# Patient Record
Sex: Male | Born: 1970
Health system: Southern US, Community
[De-identification: ages and names within clinical notes are randomized; demographics above are authoritative.]

## PROBLEM LIST (undated history)

## (undated) DIAGNOSIS — T8859XA Other complications of anesthesia, initial encounter: Secondary | ICD-10-CM

## (undated) DIAGNOSIS — R112 Nausea with vomiting, unspecified: Secondary | ICD-10-CM

## (undated) DIAGNOSIS — I10 Essential (primary) hypertension: Secondary | ICD-10-CM

## (undated) DIAGNOSIS — R0981 Nasal congestion: Secondary | ICD-10-CM

## (undated) DIAGNOSIS — Z9889 Other specified postprocedural states: Secondary | ICD-10-CM

## (undated) DIAGNOSIS — G4730 Sleep apnea, unspecified: Secondary | ICD-10-CM

## (undated) DIAGNOSIS — D649 Anemia, unspecified: Secondary | ICD-10-CM

## (undated) DIAGNOSIS — R490 Dysphonia: Secondary | ICD-10-CM

## (undated) DIAGNOSIS — K859 Acute pancreatitis without necrosis or infection, unspecified: Secondary | ICD-10-CM

## (undated) DIAGNOSIS — M1990 Unspecified osteoarthritis, unspecified site: Secondary | ICD-10-CM

## (undated) DIAGNOSIS — K219 Gastro-esophageal reflux disease without esophagitis: Secondary | ICD-10-CM

## (undated) DIAGNOSIS — T4145XA Adverse effect of unspecified anesthetic, initial encounter: Secondary | ICD-10-CM

## (undated) DIAGNOSIS — J342 Deviated nasal septum: Secondary | ICD-10-CM

## (undated) DIAGNOSIS — E119 Type 2 diabetes mellitus without complications: Secondary | ICD-10-CM

## (undated) HISTORY — DX: Anemia, unspecified: D64.9

## (undated) HISTORY — PX: SHOULDER SURGERY: SHX246

## (undated) HISTORY — PX: WISDOM TOOTH EXTRACTION: SHX21

---

## 2005-01-31 ENCOUNTER — Ambulatory Visit (HOSPITAL_BASED_OUTPATIENT_CLINIC_OR_DEPARTMENT_OTHER): Admission: RE | Admit: 2005-01-31 | Discharge: 2005-01-31 | Payer: Self-pay | Admitting: Family Medicine

## 2005-02-04 ENCOUNTER — Ambulatory Visit: Payer: Self-pay | Admitting: Internal Medicine

## 2005-04-20 ENCOUNTER — Ambulatory Visit: Payer: Self-pay | Admitting: Internal Medicine

## 2005-05-16 ENCOUNTER — Ambulatory Visit (HOSPITAL_BASED_OUTPATIENT_CLINIC_OR_DEPARTMENT_OTHER): Admission: RE | Admit: 2005-05-16 | Discharge: 2005-05-16 | Payer: Self-pay | Admitting: Internal Medicine

## 2005-05-20 ENCOUNTER — Ambulatory Visit: Payer: Self-pay | Admitting: Internal Medicine

## 2005-06-04 ENCOUNTER — Ambulatory Visit: Payer: Self-pay | Admitting: Internal Medicine

## 2005-07-06 ENCOUNTER — Ambulatory Visit: Payer: Self-pay | Admitting: Internal Medicine

## 2005-08-10 ENCOUNTER — Ambulatory Visit: Payer: Self-pay | Admitting: Internal Medicine

## 2005-11-23 ENCOUNTER — Ambulatory Visit: Payer: Self-pay | Admitting: Internal Medicine

## 2005-12-28 ENCOUNTER — Ambulatory Visit: Payer: Self-pay | Admitting: Internal Medicine

## 2009-02-23 ENCOUNTER — Emergency Department (HOSPITAL_COMMUNITY): Admission: EM | Admit: 2009-02-23 | Discharge: 2009-02-23 | Payer: Self-pay | Admitting: Emergency Medicine

## 2011-03-16 NOTE — Procedures (Signed)
NAME:  Ronnie Mejia, Ronnie Mejia                ACCOUNT NO.:  [id number]   MEDICAL RECORD NO.:  [id number]          PATIENT TYPE:  OUT   LOCATION:  SLEEP CENTER                 FACILITY:  [facility]   PHYSICIAN:  Clinton D. Maple Hudson, M.D. DATE OF BIRTH:  12/05/1970   DATE OF STUDY:                              NOCTURNAL POLYSOMNOGRAM   REFERRING PHYSICIAN:  Dr. Windle Guard   INDICATION FOR STUDY:  Hypersomnia with sleep apnea.   Epworth sleepiness score 8/24, BMI 35, weight 254 pounds.   SLEEP ARCHITECTURE:  Total sleep time 332 minutes with sleep efficiency 80%.  Stage I was 5%, stage II 68%, stages III and IV 82%, REM 8% of total sleep  time.  Sleep latency 32 minutes.  REM latency 240 minutes.  Awake after  sleep onset 44 minutes.  Arousal index 42.  No medications were taken.   RESPIRATORY DATA:  NPSG protocol.  Respiratory disturbance index (RDI, AHI)  57.8 obstructive events per hour indicating severe obstructive sleep  apnea/hypopnea syndrome.  There were 32 obstructive apneas and 343  hypopneas.  The events were not positional.  REM RDI was 105.  CPAP  titration was not requested.   OXYGEN DATA:  Very loud snoring with oxygen desaturation to a nadir of 76%.  Mean oxygen saturation through the study was 92% on room air.   CARDIAC DATA:  Normal sinus rhythm with occasional PAC.   MOVEMENT/PARASOMNIA:  Occasional leg jerks with little effect on sleep.   IMPRESSION AND RECOMMENDATION:  1.  Severe obstructive sleep apnea/hypopnea syndrome.  RDI 67.8 per hour      with loud snoring and oxygen desaturation to 76%.  2.  Consider for CPAP titration or evaluate with alternative therapy as      appropriate.      CDY/MEDQ  D:  02/04/2005 12:59:28  T:  02/04/2005 18:16:19  Job:  [id number]

## 2011-03-16 NOTE — Procedures (Signed)
NAME:  Ronnie Mejia, Ronnie Mejia                ACCOUNT NO.:  [id number]   MEDICAL RECORD NO.:  [id number]          PATIENT TYPE:  OUT   LOCATION:  SLEEP CENTER                 FACILITY:  [facility]   PHYSICIAN:  Clinton D. Maple Hudson, M.D. DATE OF BIRTH:  Sep 09, 1971   DATE OF STUDY:  05/16/2005                              NOCTURNAL POLYSOMNOGRAM   REFERRING PHYSICIAN:  Clinton D. Maple Hudson, M.D.   INDICATION FOR STUDY:  Hypersomnia with sleep apnea.   EPWORTH SLEEPINESS SCORE:  6/24   BMI:  35   WEIGHT:  224 pounds   A baseline diagnostic MPSG on January 31, 2005 recorded an RDI of 67.8 per  hour.  CPAP titration is requested.   SLEEP ARCHITECTURE:  Total sleep time 232 minutes with sleep efficiency 80%.  Stage I is 4%, stage II 52%, stages III and IV 36%.  REM was 7% of total  sleep time.  Sleep latency 6.5 minutes.  REM latency 179 minutes.  Awake  after sleep onset 73 minutes.  Arousal index 36.  No bedtime medication  recorded.   RESPIRATORY DATA:  CPAP titration protocol.  CPAP was titrated to 14 CWP,  RDI 5.8 per hour.  A medium ResMed Ultra Mirage mask was used with a heated  humidifier.   OXYGEN DATA:  Snoring was suppressed by CPAP.  Mean oxygen saturation on  CPAP 95%.   CARDIAC DATA:  Normal sinus rhythm.   MOVEMENT/PARASOMNIA:  A total of 69 limb jerks were recorded, of which 37  were associated arousal or awakening for periodic limb movement with arousal  index of 6.9 per hour, which was increased.   IMPRESSION/RECOMMENDATION:  1.  Successful CPAP titration to 14 CWP, respiratory disturbance index  5.8      per hour using a medium ResMed Ultra Mirage mask with heated humidifier.  2.  Baseline diagnostic MPSG on 01/31/05 with recorded respiratory disturbance      index  of 67.8 per hour.  3.  Periodic limb movements with arousal, 6.9 per hour.      Clinton D. Maple Hudson, M.D.  Diplomat    CDY/MEDQ  D:  05/20/2005 12:40:31  T:  05/21/2005 09:03:39  Job:  [id number]

## 2014-05-29 DIAGNOSIS — K859 Acute pancreatitis without necrosis or infection, unspecified: Secondary | ICD-10-CM

## 2014-05-29 HISTORY — DX: Acute pancreatitis without necrosis or infection, unspecified: K85.90

## 2014-06-16 ENCOUNTER — Encounter (HOSPITAL_COMMUNITY): Payer: Self-pay | Admitting: Emergency Medicine

## 2014-06-16 ENCOUNTER — Emergency Department (HOSPITAL_COMMUNITY): Payer: Managed Care, Other (non HMO)

## 2014-06-16 ENCOUNTER — Inpatient Hospital Stay (HOSPITAL_COMMUNITY): Payer: Managed Care, Other (non HMO)

## 2014-06-16 ENCOUNTER — Inpatient Hospital Stay (HOSPITAL_COMMUNITY)
Admission: EM | Admit: 2014-06-16 | Discharge: 2014-06-18 | DRG: 439 | Disposition: A | Discharge status: Home / Self Care | Payer: Managed Care, Other (non HMO) | Attending: Internal Medicine | Admitting: Internal Medicine

## 2014-06-16 DIAGNOSIS — K560 Paralytic ileus: Secondary | ICD-10-CM | POA: Diagnosis present

## 2014-06-16 DIAGNOSIS — K567 Ileus, unspecified: Secondary | ICD-10-CM | POA: Diagnosis present

## 2014-06-16 DIAGNOSIS — Z8249 Family history of ischemic heart disease and other diseases of the circulatory system: Secondary | ICD-10-CM

## 2014-06-16 DIAGNOSIS — R1013 Epigastric pain: Secondary | ICD-10-CM | POA: Diagnosis not present

## 2014-06-16 DIAGNOSIS — Z79899 Other long term (current) drug therapy: Secondary | ICD-10-CM

## 2014-06-16 DIAGNOSIS — K859 Acute pancreatitis without necrosis or infection, unspecified: Secondary | ICD-10-CM | POA: Diagnosis not present

## 2014-06-16 DIAGNOSIS — E663 Overweight: Secondary | ICD-10-CM | POA: Diagnosis present

## 2014-06-16 DIAGNOSIS — E119 Type 2 diabetes mellitus without complications: Secondary | ICD-10-CM | POA: Diagnosis present

## 2014-06-16 DIAGNOSIS — Z833 Family history of diabetes mellitus: Secondary | ICD-10-CM

## 2014-06-16 DIAGNOSIS — Z6832 Body mass index (BMI) 32.0-32.9, adult: Secondary | ICD-10-CM

## 2014-06-16 HISTORY — DX: Sleep apnea, unspecified: G47.30

## 2014-06-16 HISTORY — DX: Type 2 diabetes mellitus without complications: E11.9

## 2014-06-16 HISTORY — DX: Acute pancreatitis without necrosis or infection, unspecified: K85.90

## 2014-06-16 LAB — LIPID PANEL
Cholesterol: 134 mg/dL (ref 0–200)
HDL: 30 mg/dL — ABNORMAL LOW (ref >39)
LDL CALC: 74 mg/dL (ref 0–99)
Total CHOL/HDL Ratio: 4.5 RATIO
Triglycerides: 149 mg/dL (ref <150)
VLDL: 30 mg/dL (ref 0–40)

## 2014-06-16 LAB — COMPREHENSIVE METABOLIC PANEL
ALBUMIN: 3.7 g/dL (ref 3.5–5.2)
ALK PHOS: 55 U/L (ref 39–117)
ALT: 47 U/L (ref 0–53)
ANION GAP: 14 (ref 5–15)
AST: 25 U/L (ref 0–37)
BUN: 12 mg/dL (ref 6–23)
CALCIUM: 8.7 mg/dL (ref 8.4–10.5)
CO2: 23 mEq/L (ref 19–32)
Chloride: 103 mEq/L (ref 96–112)
Creatinine, Ser: 0.76 mg/dL (ref 0.50–1.35)
GFR calc non Af Amer: >90 mL/min (ref >90)
GLUCOSE: 224 mg/dL — AB (ref 70–99)
POTASSIUM: 3.8 meq/L (ref 3.7–5.3)
SODIUM: 140 meq/L (ref 137–147)
TOTAL PROTEIN: 7.2 g/dL (ref 6.0–8.3)
Total Bilirubin: 0.5 mg/dL (ref 0.3–1.2)

## 2014-06-16 LAB — URINALYSIS, ROUTINE W REFLEX MICROSCOPIC
Bilirubin Urine: NEGATIVE
HGB URINE DIPSTICK: NEGATIVE
Ketones, ur: NEGATIVE mg/dL
LEUKOCYTES UA: NEGATIVE
Nitrite: NEGATIVE
PROTEIN: 30 mg/dL — AB
SPECIFIC GRAVITY, URINE: 1.027 (ref 1.005–1.030)
UROBILINOGEN UA: 0.2 mg/dL (ref 0.0–1.0)
pH: 6 (ref 5.0–8.0)

## 2014-06-16 LAB — CBC WITH DIFFERENTIAL/PLATELET
BASOS PCT: 0 % (ref 0–1)
Basophils Absolute: 0 10*3/uL (ref 0.0–0.1)
EOS ABS: 0.2 10*3/uL (ref 0.0–0.7)
EOS PCT: 3 % (ref 0–5)
HCT: 42.9 % (ref 39.0–52.0)
Hemoglobin: 14.9 g/dL (ref 13.0–17.0)
Lymphocytes Relative: 25 % (ref 12–46)
Lymphs Abs: 1.9 10*3/uL (ref 0.7–4.0)
MCH: 31.6 pg (ref 26.0–34.0)
MCHC: 34.7 g/dL (ref 30.0–36.0)
MCV: 90.9 fL (ref 78.0–100.0)
Monocytes Absolute: 0.5 10*3/uL (ref 0.1–1.0)
Monocytes Relative: 7 % (ref 3–12)
NEUTROS PCT: 65 % (ref 43–77)
Neutro Abs: 5 10*3/uL (ref 1.7–7.7)
PLATELETS: 198 10*3/uL (ref 150–400)
RBC: 4.72 MIL/uL (ref 4.22–5.81)
RDW: 13.3 % (ref 11.5–15.5)
WBC: 7.7 10*3/uL (ref 4.0–10.5)

## 2014-06-16 LAB — URINE MICROSCOPIC-ADD ON

## 2014-06-16 LAB — HEMOGLOBIN A1C
HEMOGLOBIN A1C: 7.7 % — AB (ref <5.7)
Mean Plasma Glucose: 174 mg/dL — ABNORMAL HIGH (ref <117)

## 2014-06-16 LAB — LIPASE, BLOOD: Lipase: 1583 U/L — ABNORMAL HIGH (ref 11–59)

## 2014-06-16 LAB — GLUCOSE, CAPILLARY
GLUCOSE-CAPILLARY: 182 mg/dL — AB (ref 70–99)
GLUCOSE-CAPILLARY: 172 mg/dL — AB (ref 70–99)
Glucose-Capillary: 149 mg/dL — ABNORMAL HIGH (ref 70–99)

## 2014-06-16 LAB — RAPID URINE DRUG SCREEN, HOSP PERFORMED
Amphetamines: NOT DETECTED
BENZODIAZEPINES: NOT DETECTED
Barbiturates: NOT DETECTED
COCAINE: NOT DETECTED
OPIATES: NOT DETECTED
Tetrahydrocannabinol: NOT DETECTED

## 2014-06-16 LAB — ETHANOL: Alcohol, Ethyl (B): <11 mg/dL (ref 0–11)

## 2014-06-16 LAB — HIV ANTIBODY (ROUTINE TESTING W REFLEX): HIV: NONREACTIVE

## 2014-06-16 MED ORDER — SODIUM CHLORIDE 0.9 % IV BOLUS (SEPSIS)
1000.0000 mL | Freq: Once | INTRAVENOUS | Status: AC
  Administered 2014-06-16: 1000 mL via INTRAVENOUS

## 2014-06-16 MED ORDER — ONDANSETRON HCL 4 MG/2ML IJ SOLN
4.0000 mg | Freq: Three times a day (TID) | INTRAMUSCULAR | Status: AC | PRN
  Administered 2014-06-16: 4 mg via INTRAVENOUS
  Filled 2014-06-16: qty 2

## 2014-06-16 MED ORDER — ONDANSETRON HCL 4 MG/2ML IJ SOLN
4.0000 mg | Freq: Once | INTRAMUSCULAR | Status: AC
  Administered 2014-06-16: 4 mg via INTRAVENOUS
  Filled 2014-06-16: qty 2

## 2014-06-16 MED ORDER — SODIUM CHLORIDE 0.9 % IV SOLN
INTRAVENOUS | Status: DC
  Administered 2014-06-16 – 2014-06-18 (×7): via INTRAVENOUS

## 2014-06-16 MED ORDER — ONDANSETRON HCL 4 MG/2ML IJ SOLN
4.0000 mg | Freq: Once | INTRAMUSCULAR | Status: DC
  Filled 2014-06-16: qty 2

## 2014-06-16 MED ORDER — SODIUM CHLORIDE 0.9 % IV SOLN
INTRAVENOUS | Status: DC
  Administered 2014-06-16: 10:00:00 via INTRAVENOUS

## 2014-06-16 MED ORDER — HYDROMORPHONE HCL PF 1 MG/ML IJ SOLN
1.0000 mg | Freq: Once | INTRAMUSCULAR | Status: AC
  Administered 2014-06-16: 1 mg via INTRAVENOUS
  Filled 2014-06-16: qty 1

## 2014-06-16 MED ORDER — ACETAMINOPHEN 325 MG PO TABS
650.0000 mg | ORAL_TABLET | Freq: Four times a day (QID) | ORAL | Status: DC | PRN
  Administered 2014-06-16: 650 mg via ORAL
  Filled 2014-06-16: qty 2

## 2014-06-16 MED ORDER — SODIUM CHLORIDE 0.9 % IV SOLN: INTRAVENOUS | Status: DC

## 2014-06-16 MED ORDER — HYDROMORPHONE HCL PF 1 MG/ML IJ SOLN
1.0000 mg | INTRAMUSCULAR | Status: DC | PRN
  Administered 2014-06-16 (×2): 1 mg via INTRAVENOUS
  Filled 2014-06-16 (×2): qty 1

## 2014-06-16 MED ORDER — ENOXAPARIN SODIUM 40 MG/0.4ML ~~LOC~~ SOLN
40.0000 mg | SUBCUTANEOUS | Status: DC
  Administered 2014-06-16 – 2014-06-17 (×2): 40 mg via SUBCUTANEOUS
  Filled 2014-06-16 (×3): qty 0.4

## 2014-06-16 MED ORDER — ACETAMINOPHEN 650 MG RE SUPP: 650.0000 mg | Freq: Four times a day (QID) | RECTAL | Status: DC | PRN

## 2014-06-16 MED ORDER — INSULIN ASPART 100 UNIT/ML ~~LOC~~ SOLN
0.0000 [IU] | Freq: Three times a day (TID) | SUBCUTANEOUS | Status: DC
  Administered 2014-06-16: 2 [IU] via SUBCUTANEOUS
  Administered 2014-06-17: 1 [IU] via SUBCUTANEOUS
  Administered 2014-06-17: 2 [IU] via SUBCUTANEOUS
  Administered 2014-06-17 – 2014-06-18 (×2): 1 [IU] via SUBCUTANEOUS

## 2014-06-16 NOTE — H&P (Signed)
INTERNAL MEDICINE TEACHING ATTENDING NOTE  Day 0 of stay  Patient name: Ronnie Mejia  MRN: [id number] Date of birth: 05-14-1971   Key clinical points and exam                                                           43 y.o.male with first attack of acute pancreatitis. PMH uncontrolled DM2 on Metformin. I have read the HPI by Dr Eula Fried and I agree with the chronology of the documentation. Denies any recent illnesses. No regular alcohol use. I met with and interviewed the patient right now. He complains of sharp intermittent abdominal pain, in a band like fashion in upper abdomen. His exam is significant for upper abdominal tenderness, some voluntary guarding, no distension. BS sluggish. Vitals stable.   I have reviewed the chart, lab results, EKG, imaging and relevant notes of this patient.   Assessment and Plan                                                                       Acute pancreatitis with unsure etiology. No gallstones on ultrasound. Continue fluids.  Continue holding metformin and keep on insulin.  Agree with Dr Demetrio Lapping management.  I have seen and evaluated this patient and discussed it with my IM resident team.  Please see the rest of the plan per resident note from today.   Madilyn Fireman 06/16/2014, 3:43 PM.

## 2014-06-16 NOTE — ED Provider Notes (Signed)
CSN: [id number]     Arrival date & time 06/16/14  0507 History   First MD Initiated Contact with Patient 06/16/14 [phone]     Chief Complaint  Patient presents with  . Abdominal Pain     (Consider location/radiation/quality/duration/timing/severity/associated sxs/prior Treatment) HPI Comments: Patient presents with complaint of severe upper abdominal pain beginning acutely and awaking patient from sleep at approximately 4 AM. Associated with nausea and vomiting. Pain radiates to back but not her shoulders. No chest pain or shortness of breath. No fever, chills. Patient denies diarrhea, dysuria, or hematuria. No history of gallbladder issues. He drinks alcohol but denies drinking daily or heavily recently. No history of high cholesterol. The onset of this condition was acute. The course is constant. Aggravating factors: none. Alleviating factors: none.    Patient is a 43 y.o. male presenting with abdominal pain. The history is provided by the patient.  Abdominal Pain Associated symptoms: nausea and vomiting   Associated symptoms: no chest pain, no cough, no diarrhea, no dysuria, no fever and no sore throat     History reviewed. No pertinent past medical history. History reviewed. No pertinent past surgical history. History reviewed. No pertinent family history. History  Substance Use Topics  . Smoking status: Never Smoker   . Smokeless tobacco: Not on file  . Alcohol Use: Yes    Review of Systems  Constitutional: Negative for fever.  HENT: Negative for rhinorrhea and sore throat.   Eyes: Negative for redness.  Respiratory: Negative for cough.   Cardiovascular: Negative for chest pain.  Gastrointestinal: Positive for nausea, vomiting and abdominal pain. Negative for diarrhea.  Genitourinary: Negative for dysuria.  Musculoskeletal: Positive for back pain. Negative for myalgias.  Skin: Negative for rash.  Neurological: Negative for headaches.    Allergies  Review of patient's  allergies indicates no known allergies.  Home Medications   Prior to Admission medications   Medication Sig Start Date End Date Taking? Authorizing Provider  metFORMIN (GLUCOPHAGE) 500 MG tablet Take 500 mg by mouth 2 (two) times daily with a meal.  05/19/14  Yes Historical Provider, MD   BP 139/90  Pulse 82  Temp(Src) 97.7 F (36.5 C) (Oral)  Resp 27  Ht 5\' 7"  (1.702 m)  Wt 200 lb (90.719 kg)  BMI 31.32 kg/m2  SpO2 98%  Physical Exam  Nursing note and vitals reviewed. Constitutional: He appears well-developed and well-nourished.  HENT:  Head: Normocephalic and atraumatic.  Eyes: Conjunctivae are normal. Right eye exhibits no discharge. Left eye exhibits no discharge.  Neck: Normal range of motion. Neck supple.  Cardiovascular: Normal rate, regular rhythm and normal heart sounds.   Pulses:      Radial pulses are 2+ on the right side, and 2+ on the left side.  Pulmonary/Chest: Effort normal and breath sounds normal.  Abdominal: Soft. He exhibits no distension. Bowel sounds are decreased. There is tenderness in the right upper quadrant, epigastric area and left upper quadrant. There is no rebound, no guarding and no CVA tenderness.  Neurological: He is alert.  Skin: Skin is warm and dry.  Psychiatric: He has a normal mood and affect.    ED Course  Procedures (including critical care time) Labs Review Labs Reviewed  COMPREHENSIVE METABOLIC PANEL - Abnormal; Notable for the following:    Glucose, Bld 224 (*)    All other components within normal limits  LIPASE, BLOOD - Abnormal; Notable for the following:    Lipase 1583 (*)    All other components  within normal limits  CBC WITH DIFFERENTIAL  URINALYSIS, ROUTINE W REFLEX MICROSCOPIC    Imaging Review US Abdomen Complete  06/16/2014   CLINICAL DATA:  Pancreatitis.  EXAM: ULTRASOUND ABDOMEN COMPLETE  COMPARISON:  None.  FINDINGS: Gallbladder:  No gallstones or wall thickening visualized. No sonographic Murphy sign noted.   Common bile duct:  Diameter: 4.3 mm  Liver:  No focal lesion identified. Within normal limits in parenchymal echogenicity.  IVC:  No abnormality visualized.  Pancreas:  Visualized portion unremarkable.  Spleen:  Size and appearance within normal limits.  Right Kidney:  Length: 11.2 cm. Echogenicity within normal limits. No mass or hydronephrosis visualized.  Left Kidney:  Length: Left 0.6 cm. Echogenicity within normal limits. No significant mass or hydronephrosis visualized. 3.3 cm simple cyst.  Abdominal aorta:  No aneurysm visualized.  Other findings:  None.  IMPRESSION: No significant abnormality.   Electronically Signed   By: Maisie Fus  Register   On: 06/16/2014 07:54     EKG Interpretation None      6:47 AM Patient seen and examined. Work-up initiated. Medications ordered.   Vital signs reviewed and are as follows: BP 139/90  Pulse 82  Temp(Src) 97.7 F (36.5 C) (Oral)  Resp 27  Ht 5\' 7"  (1.702 m)  Wt 200 lb (90.719 kg)  BMI 31.32 kg/m2  SpO2 98%   7:13 AM Patient has pancreatitis. [place] ordered. Patient again denies heavy or excessive EtOH use. Will likely need admission for symptom control.   8:10 AM IMTS to see and admit.    MDM   Final diagnoses:  Acute pancreatitis, unspecified pancreatitis type   Admit for symptoms control, work-up.     Renne Crigler, PA-C 06/16/14 [phone]

## 2014-06-16 NOTE — ED Notes (Signed)
Phlebotomy at bedside.

## 2014-06-16 NOTE — H&P (Signed)
Date: 06/16/2014               Patient Name:  Ronnie Mejia MRN: [id number]  DOB: August 20, 1971 Age / Sex: 43 y.o., male   PCP: Geoffery LyonsEric M Turner, MD         Medical Service: Internal Medicine Teaching Service         Attending Physician: Dr. Aletta EdouardShilpa Bhardwaj, MD    First Contact: Dr. Senaida Oresichardson Pager: [phone]  Second Contact: Dr. Zada GirtKazibwe Pager: [phone]       After Hours (After 5p/  First Contact Pager: [phone]  weekends / holidays): Second Contact Pager: [phone]   Chief Complaint: Abdominal pain  History of Present Illness: Mr. Ronnie Nunnerypple is a 43 year old male with DM2 presenting to the ED today with complaints of severe abdominal pain since ~330am this morning.  He claims he was feeling fine yesterday and the days before and went to bed a little after midnight last night and then woke up early this morning in excruciating abdominal pain.  He reports the pain to be 10/10, radiating across the upper part of his abdomen to his mid back, intermittent, no similar prior episodes, worse with movement. In fact, he says he has never been to the hospital.  He endorses eating [employer] for lunch yesterday and chicken tenders for dinner (last meal ~9-10pm) and felt fine.  This morning in addition to the abdominal pain, he has had 3 episodes of vomiting (yellow with some acid taste and dry heaving) and nausea.  He does admit to occasional alcohol use (last use on Saturday at a wedding reception, total 2-3 beers) but is not a regular drinker. He also has never smoked cigarettes and denies any recent illicit drug use.  He denies chest pain, dysuria, sob. Passed gas this morning and regular BM yesterday. Of note, he does endorse being under a lot of stress lately with a work banquet coming up next month and fiance getting a hysterectomy next week.   [place] and was recently started on metformin twice a day a few months ago, which he does not take regularly. He does not think he took any  metformin yesterday.   Meds: Current Facility-Administered Medications  Medication Dose Route Frequency Provider Last Rate Last Dose  . sodium chloride 0.9 % bolus 1,000 mL  1,000 mL Intravenous Once Renne CriglerJoshua Geiple, PA-C 1,000 mL/hr at 06/16/14 0802 1,000 mL at 06/16/14 0802   Current Outpatient Prescriptions  Medication Sig Dispense Refill  . metFORMIN (GLUCOPHAGE) 500 MG tablet Take 500 mg by mouth 2 (two) times daily with a meal.        Allergies: Allergies as of 06/16/2014  . (No Known Allergies)   Past Medical History  Diagnosis Date  . Diabetes mellitus     on metformin   History reviewed. No pertinent past surgical history. Family History  Problem Relation Age of Onset  . Diabetes Mother   . Hypertension Mother   . Diabetes Father   . Cancer      paternal aunt   History   Social History  . Marital Status: Single    Spouse Name: N/A    Number of Children: N/A  . Years of Education: N/A   Occupational History  . Not on file.   Social History Main Topics  . Smoking status: Never Smoker   . Smokeless tobacco: Not on file  . Alcohol Use: Yes  . Drug Use: No  .  Sexual Activity: Not on file   Other Topics Concern  . Not on file   Social History Narrative  . No narrative on file   Review of Systems:  Constitutional:  Denies fever, chills  HEENT:  Denies congestion  Respiratory:  Denies SOB   Cardiovascular:  Denies palpitations and leg swelling and chest pain  Gastrointestinal:  Nausea, vomiting, abdominal pain.  Denies diarrhea, constipation.  Genitourinary:  Denies dysuria  Musculoskeletal:  Back pain  Skin:  Denies rash or recent unusual bug bites  Neurological:  Denies seizures but has been under a lot of stress lately   Physical Exam: Blood pressure 135/83, pulse 74, temperature 97.7 F (36.5 C), temperature source Oral, resp. rate 21, height 5\' 7"  (1.702 m), weight 200 lb (90.719 kg), SpO2 98.00%. Vitals reviewed. General: resting in bed,  NAD HEENT: PERRL, EOMI Cardiac: RRR Pulm: clear to auscultation bilaterally, no wheezes, rales, or rhonchi Abd: soft with relaxation, distended, mild guarding,  Tenderness to palpation epigastric and RLQ region more than other quandrants, hypoactive BS present Ext: warm and well perfused, no pedal edema, moving all 4 extremities, +2dp b/l Neuro: alert and oriented X3, strength and sensation to light touch equal in bilateral upper and lower extremities Skin: hairy chest and back, few dried up excoriations on lower extremities  Lab results: Basic Metabolic Panel:  Recent Labs  16/10/96 0546  NA 140  K 3.8  CL 103  CO2 23  GLUCOSE 224*  BUN 12  CREATININE 0.76  CALCIUM 8.7   Liver Function Tests:  Recent Labs  06/16/14 0546  AST 25  ALT 47  ALKPHOS 55  BILITOT 0.5  PROT 7.2  ALBUMIN 3.7    Recent Labs  06/16/14 0546  LIPASE 1583*   CBC:  Recent Labs  06/16/14 0546  WBC 7.7  NEUTROABS 5.0  HGB 14.9  HCT 42.9  MCV 90.9  PLT 198   Hemoglobin A1C: No results found for this basename: HGBA1C,  in the last 72 hours Fasting Lipid Panel:  Recent Labs  06/16/14 0835  CHOL 134  HDL 30*  LDLCALC 74  TRIG 045  CHOLHDL 4.5   Urine Drug Screen: Drugs of Abuse     Component Value Date/Time   LABOPIA NONE DETECTED 06/16/2014 0837    Alcohol Level:  Recent Labs  06/16/14 0835  ETH <11   Urinalysis:  Recent Labs  06/16/14 0818  COLORURINE YELLOW  LABSPEC 1.027  PHURINE 6.0  GLUCOSEU >1000*  HGBUR NEGATIVE  BILIRUBINUR NEGATIVE  KETONESUR NEGATIVE  PROTEINUR 30*  UROBILINOGEN 0.2  NITRITE NEGATIVE  LEUKOCYTESUR NEGATIVE    Imaging results:  US Abdomen Complete  06/16/2014   CLINICAL DATA:  Pancreatitis.  EXAM: ULTRASOUND ABDOMEN COMPLETE  COMPARISON:  None.  FINDINGS: Gallbladder:  No gallstones or wall thickening visualized. No sonographic Murphy sign noted.  Common bile duct:  Diameter: 4.3 mm  Liver:  No focal lesion identified. Within  normal limits in parenchymal echogenicity.  IVC:  No abnormality visualized.  Pancreas:  Visualized portion unremarkable.  Spleen:  Size and appearance within normal limits.  Right Kidney:  Length: 11.2 cm. Echogenicity within normal limits. No mass or hydronephrosis visualized.  Left Kidney:  Length: Left 0.6 cm. Echogenicity within normal limits. No significant mass or hydronephrosis visualized. 3.3 cm simple cyst.  Abdominal aorta:  No aneurysm visualized.  Other findings:  None.  IMPRESSION: No significant abnormality.   Electronically Signed   By: Maisie Fus  Register   On: 06/16/2014  07:54   Other results: EKG: pending  Assessment & Plan by Problem: Active Problems:   Acute pancreatitis   DM2 (diabetes mellitus, type 2) Ronnie Mejia is a 43 year old male with DM2 on metformin admitted for acute pancreatitis.   Acute pancreatitis--unclear etiology. Does not appear to be secondary to recent alcohol use or gallstones.  Lipase on admission 1583 and characteristic abdominal pain radiating to back.  Possibly secondary to medication (on metformin but frequently misses doses and noted to be a class III drug class category for dug induced pancreatitis).  He is overweight and thus hypertriglyceridemia could also be an etiology.  Abdominal ultrasound did not show gallstones or wall thickening and the visualized portion of the pancreas appeared unremarkable.  He denies any recent infection, animal bites, and trauma.  -admit to med/surg -serial abdominal exams -ekg -AM bmet and lipase -already received 2L NS bolus in ER, will continue IVF hydration with NS at this time -NPO for now but can wet lips with sponge due to dry mouth -zofran prn nausea and vomiting -dilaudid 1mg  q4h -serial abdominal exam -abdominal xray--hypoactive bowel sounds, distended abdomen -follow up u/a and uds and ethanol level -lipid panel -HIV -serial abdominal exams  DM2--on metformin 500mg  po bid at home. Glucose 224 on  admission. Hx of non-compliance with metformin.  -hold metformin -ssi sensitive and cbg monitoring  Diet: NPO DVT Ppx: Lovenox Dispo: Disposition is deferred at this time, awaiting improvement of current medical problems. Anticipated discharge in approximately 1-2 day(s).   The patient does have a current PCP Geoffery Lyons, MD) and does not need an [facility] hospital follow-up appointment after discharge.  The patient does not have transportation limitations that hinder transportation to clinic appointments.  Signed: Baltazar Apo, MD 06/16/2014, 8:15 AM

## 2014-06-16 NOTE — ED Provider Notes (Signed)
History/physical exam/procedure(s) were performed by non-physician practitioner and as supervising physician I was immediately available for consultation/collaboration. I have reviewed all notes and am in agreement with care and plan.   Hilario Quarryanielle S Jakarius Flamenco, MD 06/16/14 [phone]

## 2014-06-16 NOTE — ED Notes (Signed)
Attempted report 

## 2014-06-16 NOTE — ED Notes (Signed)
0400: severe abd. Pain that is radiating to lower back. No hx. Of kidney stones or gall bladder issues.

## 2014-06-16 NOTE — ED Notes (Signed)
Patient transported to Ultrasound 

## 2014-06-16 NOTE — ED Notes (Signed)
Pt returned from US; VSS; no signs of distress.

## 2014-06-16 NOTE — ED Notes (Signed)
Patient transported to X-ray then to be transported to floor.

## 2014-06-16 NOTE — ED Notes (Signed)
Patient given a urinal and made aware that a urine sample is needed.

## 2014-06-17 DIAGNOSIS — K560 Paralytic ileus: Secondary | ICD-10-CM

## 2014-06-17 DIAGNOSIS — Z8249 Family history of ischemic heart disease and other diseases of the circulatory system: Secondary | ICD-10-CM | POA: Diagnosis not present

## 2014-06-17 DIAGNOSIS — Z833 Family history of diabetes mellitus: Secondary | ICD-10-CM | POA: Diagnosis not present

## 2014-06-17 DIAGNOSIS — K859 Acute pancreatitis without necrosis or infection, unspecified: Secondary | ICD-10-CM | POA: Diagnosis present

## 2014-06-17 DIAGNOSIS — E663 Overweight: Secondary | ICD-10-CM | POA: Diagnosis present

## 2014-06-17 DIAGNOSIS — Z79899 Other long term (current) drug therapy: Secondary | ICD-10-CM | POA: Diagnosis not present

## 2014-06-17 DIAGNOSIS — Z6832 Body mass index (BMI) 32.0-32.9, adult: Secondary | ICD-10-CM | POA: Diagnosis not present

## 2014-06-17 DIAGNOSIS — R1013 Epigastric pain: Secondary | ICD-10-CM | POA: Diagnosis present

## 2014-06-17 DIAGNOSIS — E119 Type 2 diabetes mellitus without complications: Secondary | ICD-10-CM | POA: Diagnosis present

## 2014-06-17 LAB — BASIC METABOLIC PANEL WITH GFR
Anion gap: 10 (ref 5–15)
BUN: 8 mg/dL (ref 6–23)
CO2: 26 meq/L (ref 19–32)
Calcium: 8.3 mg/dL — ABNORMAL LOW (ref 8.4–10.5)
Chloride: 103 meq/L (ref 96–112)
Creatinine, Ser: 0.8 mg/dL (ref 0.50–1.35)
GFR calc Af Amer: >90 mL/min
GFR calc non Af Amer: >90 mL/min
Glucose, Bld: 130 mg/dL — ABNORMAL HIGH (ref 70–99)
Potassium: 3.7 meq/L (ref 3.7–5.3)
Sodium: 139 meq/L (ref 137–147)

## 2014-06-17 LAB — GLUCOSE, CAPILLARY
GLUCOSE-CAPILLARY: 151 mg/dL — AB (ref 70–99)
Glucose-Capillary: 121 mg/dL — ABNORMAL HIGH (ref 70–99)
Glucose-Capillary: 121 mg/dL — ABNORMAL HIGH (ref 70–99)

## 2014-06-17 LAB — LIPASE, BLOOD: Lipase: 138 U/L — ABNORMAL HIGH (ref 11–59)

## 2014-06-17 MED ORDER — ONDANSETRON HCL 4 MG/2ML IJ SOLN: 4.0000 mg | Freq: Three times a day (TID) | INTRAMUSCULAR | Status: DC | PRN

## 2014-06-17 MED ORDER — TRAMADOL HCL 50 MG PO TABS
50.0000 mg | ORAL_TABLET | Freq: Four times a day (QID) | ORAL | Status: DC | PRN
  Administered 2014-06-17: 50 mg via ORAL
  Filled 2014-06-17: qty 1

## 2014-06-17 MED ORDER — IBUPROFEN 400 MG PO TABS
400.0000 mg | ORAL_TABLET | Freq: Once | ORAL | Status: DC
  Administered 2014-06-17: 400 mg via ORAL
  Filled 2014-06-17: qty 1

## 2014-06-17 MED ORDER — HYDROCODONE-ACETAMINOPHEN 5-325 MG PO TABS: 1.0000 | ORAL_TABLET | Freq: Once | ORAL | Status: DC

## 2014-06-17 MED ORDER — IBUPROFEN 400 MG PO TABS: 400.0000 mg | ORAL_TABLET | ORAL | Status: DC | PRN

## 2014-06-17 MED ORDER — DIPHENHYDRAMINE HCL 25 MG PO CAPS
25.0000 mg | ORAL_CAPSULE | Freq: Once | ORAL | Status: AC
  Administered 2014-06-17: 25 mg via ORAL
  Filled 2014-06-17: qty 1

## 2014-06-17 NOTE — Progress Notes (Signed)
Subjective:  Patient was seen and examined this morning. Patient states that he is doing well. He states his nausea has improved from yesterday and his abdominal pain has resolved. He feels hungry and would like to try to eat food. Patient does admit to a 5/10 frontal headache that is a constant pressure sensation. He denies any radiation of pain. He denies any photophobia or phonophobia. He doesn't want to take the IV Dilaudid and states that Tylenol doesn't work for him. He denies any fever, chills, chest pain, shortness of breath, nausea, vomiting, diarrhea or abdominal pain.   Objective: Vital signs in last 24 hours: Filed Vitals:   06/16/14 1438 06/16/14 2100 06/16/14 2159 06/17/14 0625  BP: 122/83 106/57  114/73  Pulse: 78 77 75 69  Temp: 98.2 F (36.8 C) 98 F (36.7 C)  98 F (36.7 C)  TempSrc: Oral Oral    Resp: 16 18 18 18   Height:      Weight:    203 lb 12.8 oz (92.443 kg)  SpO2: 98% 95%  96%   Weight change: 4 lb 14.4 oz (2.223 kg)  Intake/Output Summary (Last 24 hours) at 06/17/14 1106 Last data filed at 06/17/14 0900  Gross per 24 hour  Intake 2231.25 ml  Output      0 ml  Net 2231.25 ml   Filed Vitals:   06/16/14 1438 06/16/14 2100 06/16/14 2159 06/17/14 0625  BP: 122/83 106/57  114/73  Pulse: 78 77 75 69  Temp: 98.2 F (36.8 C) 98 F (36.7 C)  98 F (36.7 C)  TempSrc: Oral Oral    Resp: 16 18 18 18   Height:      Weight:    203 lb 12.8 oz (92.443 kg)  SpO2: 98% 95%  96%   General: Vital signs reviewed.  Patient is well-developed and well-nourished, in no acute distress and cooperative with exam.  Cardiovascular: RRR, S1 normal, S2 normal, no murmurs, gallops, or rubs. Pulmonary/Chest: Clear to auscultation bilaterally, no wheezes, rales, or rhonchi. Abdominal: Soft, mild tenderness in epigastric pain (pt qualifies as 1-2/10), distended, decreased BS , no masses, organomegaly, or guarding present.  Musculoskeletal: No joint deformities, erythema, or  stiffness, ROM full and nontender. Extremities: No lower extremity edema bilaterally,  pulses symmetric and intact bilaterally. No cyanosis or clubbing. Neurological: A&O x3, Strength is normal and symmetric bilaterally, no focal motor deficit, sensory intact to light touch bilaterally.  Skin: Warm, dry and intact. No rashes or erythema. Psychiatric: Normal mood and affect. speech and behavior is normal. Cognition and memory are normal.   Lab Results: Basic Metabolic Panel:  Recent Labs Lab 06/16/14 0546 06/17/14 0410  NA 140 139  K 3.8 3.7  CL 103 103  CO2 23 26  GLUCOSE 224* 130*  BUN 12 8  CREATININE 0.76 0.80  CALCIUM 8.7 8.3*   Liver Function Tests:  Recent Labs Lab 06/16/14 0546  AST 25  ALT 47  ALKPHOS 55  BILITOT 0.5  PROT 7.2  ALBUMIN 3.7    Recent Labs Lab 06/16/14 0546 06/17/14 0410  LIPASE 1583* 138*   CBC:  Recent Labs Lab 06/16/14 0546  WBC 7.7  NEUTROABS 5.0  HGB 14.9  HCT 42.9  MCV 90.9  PLT 198   CBG:  Recent Labs Lab 06/16/14 1224 06/16/14 1649 06/16/14 2209 06/17/14 0752  GLUCAP 182* 149* 172* 151*   Hemoglobin A1C:  Recent Labs Lab 06/16/14 0818  HGBA1C 7.7*   Fasting Lipid Panel:  Recent  Labs Lab 06/16/14 0835  CHOL 134  HDL 30*  LDLCALC 74  TRIG 161  CHOLHDL 4.5   Urine Drug Screen: Drugs of Abuse     Component Value Date/Time   LABOPIA NONE DETECTED 06/16/2014 0837   COCAINSCRNUR NONE DETECTED 06/16/2014 0837   LABBENZ NONE DETECTED 06/16/2014 0837   AMPHETMU NONE DETECTED 06/16/2014 0837   THCU NONE DETECTED 06/16/2014 0837   LABBARB NONE DETECTED 06/16/2014 0837    Alcohol Level:  Recent Labs Lab 06/16/14 0835  ETH <11   Urinalysis:  Recent Labs Lab 06/16/14 0818  COLORURINE YELLOW  LABSPEC 1.027  PHURINE 6.0  GLUCOSEU >1000*  HGBUR NEGATIVE  BILIRUBINUR NEGATIVE  KETONESUR NEGATIVE  PROTEINUR 30*  UROBILINOGEN 0.2  NITRITE NEGATIVE  LEUKOCYTESUR NEGATIVE    Micro Results: No  results found for this or any previous visit (from the past 240 hour(s)). Studies/Results: US Abdomen Complete  06/16/2014   CLINICAL DATA:  Pancreatitis.  EXAM: ULTRASOUND ABDOMEN COMPLETE  COMPARISON:  None.  FINDINGS: Gallbladder:  No gallstones or wall thickening visualized. No sonographic Murphy sign noted.  Common bile duct:  Diameter: 4.3 mm  Liver:  No focal lesion identified. Within normal limits in parenchymal echogenicity.  IVC:  No abnormality visualized.  Pancreas:  Visualized portion unremarkable.  Spleen:  Size and appearance within normal limits.  Right Kidney:  Length: 11.2 cm. Echogenicity within normal limits. No mass or hydronephrosis visualized.  Left Kidney:  Length: Left 0.6 cm. Echogenicity within normal limits. No significant mass or hydronephrosis visualized. 3.3 cm simple cyst.  Abdominal aorta:  No aneurysm visualized.  Other findings:  None.  IMPRESSION: No significant abnormality.   Electronically Signed   By: Maisie Fus  Register   On: 06/16/2014 07:54   Dg Abd 2 Views  06/16/2014   CLINICAL DATA:  Abdominal pain and distention. Elevated lipase of 1583.  EXAM: ABDOMEN - 2 VIEW  COMPARISON:  None.  FINDINGS: Mild prominence of a single proximal jejunal bowel loop in the left mid abdomen is suggestive of focal ileus from pancreatitis given the elevated lipase level. There is no evidence of small bowel obstruction or colonic dilatation. No free air is identified. No abnormal calcifications. Bony structures are unremarkable.  IMPRESSION: No evidence of bowel obstruction. A single mildly prominent proximal jejunal bowel loop is suggestive of focal ileus due to pancreatitis.   Electronically Signed   By: Irish Lack M.D.   On: 06/16/2014 09:29   Medications: I have reviewed the patient's current medications.  Prescriptions prior to admission  Medication Sig Dispense Refill  . metFORMIN (GLUCOPHAGE) 500 MG tablet Take 500 mg by mouth 2 (two) times daily with a meal.          Scheduled Meds: . enoxaparin (LOVENOX) injection  40 mg Subcutaneous Q24H  . insulin aspart  0-9 Units Subcutaneous TID WC  . ondansetron (ZOFRAN) IV  4 mg Intravenous Once   Continuous Infusions: . sodium chloride 150 mL/hr at 06/17/14 0935   PRN Meds:.ondansetron (ZOFRAN) IV, traMADol Assessment/Plan:  Acute pancreatitis: Unclear etiology. It does not appear to be secondary to recent alcohol use, gallstones or hypertriglyceridemia. Lipase on admission 1583 and characteristic abdominal pain radiating to back. Patient's pain was well controlled yesterday and last night, but he had continued nausea. He has not been requiring much pain medication. He was NPO and eating ice chips. Lipase this morning was 138. Lipid profile showed cholesterol 134, TG 149, HDL 30 and LDL 74. Urinalysis was negative for  nitrites or leukocytes with few bacteria. UDS was negative. Patient denies nausea or abdominal pain. On physical exam, patient had very mild epigastric tenderness. He is hungry and would like to try a clear liquid diet. -Continue serial abdominal exams  -NS IV 150 mL/hr, increased from 125 mL/hr yesterday. Patient is young and healthy and can tolerate a good amount of fluids.  -Clear liquid diet -Advance diet as tolerated -zofran 4 mg IV Q8H prn nausea and vomiting  -Tramadol 50 mg po Q6H prn pain  DM2: on metformin 500mg  po bid at home. Glucose 224 on admission. Hx of non-compliance with metformin. Hemoglobin A1C 7.7 on 06/16/2014. -hold metformin  -ssi sensitive  -cbg monitoring   Headache: 5/10 constant pressure-like frontal headache without radiation. No photophobia or phonophobia. -Tramadol 50 mg Q6H prn pain  Diet: NPO   DVT Ppx: Lovenox  Dispo: Disposition is deferred at this time, awaiting improvement of current medical problems.  Anticipated discharge in approximately 1-2 day(s).   The patient does have a current PCP Geoffery Lyons, MD) and does need an [facility] hospital follow-up  appointment after discharge.  The patient does not have transportation limitations that hinder transportation to clinic appointments.  .Services Needed at time of discharge: Y = Yes, Blank = No PT:   OT:   RN:   Equipment:   Other:     LOS: 1 day   Jill Alexanders, DO PGY-1 Internal Medicine Resident Pager # [phone] 06/17/2014 11:06 AM

## 2014-06-17 NOTE — Progress Notes (Signed)
Subjective: [name] is a 43yo male with a history of DMII on hospital day 2 admitted yesterday for nausea severe mid-epigastric pain secondary to pancreatitis. This morning the patient says that he is feeling well and is pain is well controlled. He describes his pain as mild but aggravating and says he took his last does on pain medication yesterday evening and has not needed anything since. The patient also endorses a headache and minimal nausea. [name] says he has been ambulating, urinating, and passing gas but has not yet had a bowel movement. He states that he is hungry and would like to try resuming his normal diet. Patient also endorses a moderate headache. Objective: Vital signs in last 24 hours: Filed Vitals:   06/16/14 1438 06/16/14 2100 06/16/14 2159 06/17/14 0625  BP: 122/83 106/57  114/73  Pulse: 78 77 75 69  Temp: 98.2 F (36.8 C) 98 F (36.7 C)  98 F (36.7 C)  TempSrc: Oral Oral    Resp: 16 18 18 18   Height:      Weight:    92.443 kg (203 lb 12.8 oz)  SpO2: 98% 95%  96%   Weight change: 2.223 kg (4 lb 14.4 oz)  Intake/Output Summary (Last 24 hours) at 06/17/14 0913 Last data filed at 06/17/14 0631  Gross per 24 hour  Intake 2231.25 ml  Output      0 ml  Net 2231.25 ml   Exam: General appearance: alert, cooperative and no distress Lungs: clear to auscultation bilaterally Heart: regular rate and rhythm, S1, S2 normal, no murmur, click, rub or gallop Abdomen: Abdomen is minimally tender with deep palpation. No guarding. Extremities: extremities normal, atraumatic, no cyanosis or edema Lab Results: Basic Metabolic Panel:  Recent Labs Lab 06/16/14 0546 06/17/14 0410  NA 140 139  K 3.8 3.7  CL 103 103  CO2 23 26  GLUCOSE 224* 130*  BUN 12 8  CREATININE 0.76 0.80  CALCIUM 8.7 8.3*   Liver Function Tests:  Recent Labs Lab 06/16/14 0546  AST 25  ALT 47  ALKPHOS 55  BILITOT 0.5  PROT 7.2  ALBUMIN 3.7    Recent Labs Lab 06/16/14 0546  06/17/14 0410  LIPASE 1583* 138*   No results found for this basename: AMMONIA,  in the last 168 hours CBC:  Recent Labs Lab 06/16/14 0546  WBC 7.7  NEUTROABS 5.0  HGB 14.9  HCT 42.9  MCV 90.9  PLT 198    Recent Labs Lab 06/16/14 1224 06/16/14 1649 06/16/14 2209 06/17/14 0752  GLUCAP 182* 149* 172* 151*   Hemoglobin A1C: 7.7  Recent Labs Lab 06/16/14 0818  HGBA1C 7.7*   Fasting Lipid Panel:  Recent Labs Lab 06/16/14 0835  CHOL 134  HDL 30*  LDLCALC 74  TRIG 161  CHOLHDL 4.5   Urine Drug Screen: Drugs of Abuse     Component Value Date/Time   LABOPIA NONE DETECTED 06/16/2014 0837   COCAINSCRNUR NONE DETECTED 06/16/2014 0837   LABBENZ NONE DETECTED 06/16/2014 0837   AMPHETMU NONE DETECTED 06/16/2014 0837   THCU NONE DETECTED 06/16/2014 0837   LABBARB NONE DETECTED 06/16/2014 0837    Alcohol Level:  Recent Labs Lab 06/16/14 0835  ETH <11   Urinalysis:  Recent Labs Lab 06/16/14 0818  COLORURINE YELLOW  LABSPEC 1.027  PHURINE 6.0  GLUCOSEU >1000*  HGBUR NEGATIVE  BILIRUBINUR NEGATIVE  KETONESUR NEGATIVE  PROTEINUR 30*  UROBILINOGEN 0.2  NITRITE NEGATIVE  LEUKOCYTESUR NEGATIVE     Micro Results:  No results found for this or any previous visit (from the past 240 hour(s)). Studies/Results: US Abdomen Complete  06/16/2014   CLINICAL DATA:  Pancreatitis.  EXAM: ULTRASOUND ABDOMEN COMPLETE  COMPARISON:  None.  FINDINGS: Gallbladder:  No gallstones or wall thickening visualized. No sonographic Murphy sign noted.  Common bile duct:  Diameter: 4.3 mm  Liver:  No focal lesion identified. Within normal limits in parenchymal echogenicity.  IVC:  No abnormality visualized.  Pancreas:  Visualized portion unremarkable.  Spleen:  Size and appearance within normal limits.  Right Kidney:  Length: 11.2 cm. Echogenicity within normal limits. No mass or hydronephrosis visualized.  Left Kidney:  Length: Left 0.6 cm. Echogenicity within normal limits. No significant  mass or hydronephrosis visualized. 3.3 cm simple cyst.  Abdominal aorta:  No aneurysm visualized.  Other findings:  None.  IMPRESSION: No significant abnormality.   Electronically Signed   By: Maisie Fus  Register   On: 06/16/2014 07:54   Dg Abd 2 Views  06/16/2014   CLINICAL DATA:  Abdominal pain and distention. Elevated lipase of 1583.  EXAM: ABDOMEN - 2 VIEW  COMPARISON:  None.  FINDINGS: Mild prominence of a single proximal jejunal bowel loop in the left mid abdomen is suggestive of focal ileus from pancreatitis given the elevated lipase level. There is no evidence of small bowel obstruction or colonic dilatation. No free air is identified. No abnormal calcifications. Bony structures are unremarkable.  IMPRESSION: No evidence of bowel obstruction. A single mildly prominent proximal jejunal bowel loop is suggestive of focal ileus due to pancreatitis.   Electronically Signed   By: Irish Lack M.D.   On: 06/16/2014 09:29   Medications: I have reviewed the patient's current medications. Scheduled Meds: . enoxaparin (LOVENOX) injection  40 mg Subcutaneous Q24H  . insulin aspart  0-9 Units Subcutaneous TID WC         Continuous Infusions: . sodium chloride 125 mL/hr at 06/17/14 0207   PRN Meds:.acetaminophen, acetaminophen, HYDROmorphone (DILAUDID) injection, ondansetron (ZOFRAN) IV Assessment/Plan: Principal Problem:   Acute pancreatitis Active Problems:   DM2 (diabetes mellitus, type 2)   Ileus  Pancreatitis: [name] is recovering quickly after his admission yesterday for pancreatitis. Given is low pain levels, minimal nausea, and appetite his NPO status will be discontinued and switched to clear liquids progressing as tolerated.   Pain: His pain is currently reasonably well controlled no medication, but he should have some options available in case his pain progresses. His current regimen of Dilaudid is not necessary given his level of pain and the patient says Acetaminophen does not  work for him. I would like to switch him to Tramadol or other opoid for his headache and minimal epigastric pain.  DMII: [name]'s blood sugars have been moderately elevated in the ~170 range. His Metformin was discontinued after concern for instigation of his pancreatitis. After further research this is an unlikely interaction and he should be restarted on his Metformin. He is currently receiving insulin as needed.  Ileus: Plain film yesterday showed a mild ileus of the proximal jejunum. The patient is still passing gas, but has not had a bowel movement. As his diet increases over the next day he can be evaluated further if he has any troubles with defecation.  This is a Psychologist, occupational Note.  The care of the patient was discussed with Dr. Senaida Ores and the assessment and plan formulated with their assistance.  Please see their attached note for official documentation of the daily encounter.  LOS: 1 day   Chiquita LothAndrew K Radley Barto, Med Student 06/17/2014, 9:13 AM

## 2014-06-17 NOTE — Progress Notes (Signed)
Pt has CPAP at bedside and will place himself on later. RT will continue to monitor.

## 2014-06-17 NOTE — Progress Notes (Signed)
  PROGRESS NOTE MEDICINE TEACHING ATTENDING   Day 1 of stay Patient name: Ronnie Mejia   Medical record number: [id number] Date of birth: 01/10/1971   Met with patient. Doing much better. Pain better, wants to eat. Liquid diet to commence and we will monitor how he tolerates it. Blood work reviewed. Vitals stable. Exam is significant for little tenderness in the epigastric area. Lungs no rales.   I have discussed the care of this patient with my IM team residents. Please see the resident note for details.  [place], [place] 06/17/2014, 10:39 AM.

## 2014-06-17 NOTE — Progress Notes (Signed)
  I have seen and examined the patient, and reviewed the daily progress note by Staci RighterAndrew Miller, MS III and discussed the care of the patient with them. Please see my progress note from 06/17/2014 for further details regarding assessment and plan.    Signed:  Jill AlexandersAlexa Richardson, DO PGY-1 Internal Medicine Resident Pager # [phone] 06/17/2014 10:04 AM

## 2014-06-18 LAB — GLUCOSE, CAPILLARY
Glucose-Capillary: 160 mg/dL — ABNORMAL HIGH (ref 70–99)
Glucose-Capillary: 140 mg/dL — ABNORMAL HIGH (ref 70–99)

## 2014-06-18 NOTE — Progress Notes (Signed)
Discharge instructions reviewed with the patient. Pt reports that he is feeling much better and feels ready to go home. Pt reports that he doesn't have any questions at this time. Pt is ready for discharge and is waiting for a ride home.

## 2014-06-18 NOTE — Discharge Instructions (Signed)
Thank you for allowing us to be involved in your healthcare while you were hospitalized [facility].   Please note that there have not been changes to your home medications.  --> PLEASE LOOK AT YOUR DISCHARGE MEDICATION LIST FOR DETAILS.  Please call your PCP if you have any questions or concerns, or any difficulty getting any of your medications.  Please return to the ER if you have worsening of your symptoms or new severe symptoms arise.   Acute Pancreatitis Acute pancreatitis is a disease in which the pancreas becomes suddenly inflamed. The pancreas is a large gland located behind your stomach. The pancreas produces enzymes that help digest food. The pancreas also releases the hormones glucagon and insulin that help regulate blood sugar. Damage to the pancreas occurs when the digestive enzymes from the pancreas are activated and begin attacking the pancreas before being released into the intestine. Most acute attacks last a couple of days and can cause serious complications. Some people become dehydrated and develop low blood pressure. In severe cases, bleeding into the pancreas can lead to shock and can be life-threatening. The lungs, heart, and kidneys may fail. CAUSES  Pancreatitis can happen to anyone. In some cases, the cause is unknown. Most cases are caused by:  Alcohol abuse.  Gallstones. Other less common causes are:  Certain medicines.  Exposure to certain chemicals.  Infection.  Damage caused by an accident (trauma).  Abdominal surgery. SYMPTOMS   Pain in the upper abdomen that may radiate to the back.  Tenderness and swelling of the abdomen.  Nausea and vomiting. DIAGNOSIS  Your caregiver will perform a physical exam. Blood and stool tests may be done to confirm the diagnosis. Imaging tests may also be done, such as X-rays, CT scans, or an ultrasound of the abdomen. TREATMENT  Treatment usually requires a stay in the hospital. Treatment may  include:  Pain medicine.  Fluid replacement through an intravenous line (IV).  Placing a tube in the stomach to remove stomach contents and control vomiting.  Not eating for 3 or 4 days. This gives your pancreas a rest, because enzymes are not being produced that can cause further damage.  Antibiotic medicines if your condition is caused by an infection.  Surgery of the pancreas or gallbladder. HOME CARE INSTRUCTIONS   Follow the diet advised by your caregiver. This may involve avoiding alcohol and decreasing the amount of fat in your diet.  Eat smaller, more frequent meals. This reduces the amount of digestive juices the pancreas produces.  Drink enough fluids to keep your urine clear or pale yellow.  Only take over-the-counter or prescription medicines as directed by your caregiver.  Avoid drinking alcohol if it caused your condition.  Do not smoke.  Get plenty of rest.  Check your blood sugar at home as directed by your caregiver.  Keep all follow-up appointments as directed by your caregiver. SEEK MEDICAL CARE IF:   You do not recover as quickly as expected.  You develop new or worsening symptoms.  You have persistent pain, weakness, or nausea.  You recover and then have another episode of pain. SEEK IMMEDIATE MEDICAL CARE IF:   You are unable to eat or keep fluids down.  Your pain becomes severe.  You have a fever or persistent symptoms for more than 2 to 3 days.  You have a fever and your symptoms suddenly get worse.  Your skin or the white part of your eyes turn yellow (jaundice).  You develop vomiting.  You feel dizzy, or you faint.  Your blood sugar is high (over 300 mg/dL). MAKE SURE YOU:   Understand these instructions.  Will watch your condition.  Will get help right away if you are not doing well or get worse. Document Released: 10/15/2005 Document Revised: 04/15/2012 Document Reviewed: 01/24/2012 [facility] Patient Information 2015  [place], [place]. This information is not intended to replace advice given to you by your health care provider. Make sure you discuss any questions you have with your health care provider.

## 2014-06-18 NOTE — Discharge Summary (Signed)
Name: Ronnie Mejia MRN: [id number] DOB: 1971/05/21 43 y.o. PCP: Geoffery LyonsEric M Turner, MD  Date of Admission: 06/16/2014  5:19 AM Date of Discharge: 06/18/2014 Attending Physician: Aletta EdouardShilpa Bhardwaj, MD  Discharge Diagnosis:  Principal Problem:   Acute pancreatitis Active Problems:   DM2 (diabetes mellitus, type 2)  Discharge Medications:   Medication List         metFORMIN 500 MG tablet  Commonly known as:  GLUCOPHAGE  Take 500 mg by mouth 2 (two) times daily with a meal.        Disposition and follow-up:   Ronnie Mejia [facility] Good [facility].  At the hospital follow up visit please address:  1.  Pancreatitis symptoms. Please make sure that his symptoms of nausea, vomiting, and epigastric pain have not recurred. Please also address his alcohol us and continue to emphasize that alcohol use could cause a recurrence of his pancreatitis.   Follow-up Appointments: Follow-up Information   Follow up with Geoffery Lyonsurner, Eric M., MD. Call on 06/24/2014. (4:15 pm)    Specialty:  Physician Assistant   Contact information:   [address] [place] [address] [phone]       Discharge Instructions: Discharge Instructions   Call MD for:  persistant nausea and vomiting    Complete by:  As directed      Call MD for:  severe uncontrolled pain    Complete by:  As directed      Diet - low sodium heart healthy    Complete by:  As directed      Increase activity slowly    Complete by:  As directed            Consultations:    Procedures Performed:  [place] Abdomen Complete  06/16/2014   CLINICAL DATA:  Pancreatitis.  EXAM: ULTRASOUND ABDOMEN COMPLETE  COMPARISON:  None.  FINDINGS: Gallbladder:  No gallstones or wall thickening visualized. No sonographic Murphy sign noted.  Common bile duct:  Diameter: 4.3 mm  Liver:  No focal lesion identified. Within normal limits in parenchymal echogenicity.  IVC:  No abnormality visualized.  Pancreas:   Visualized portion unremarkable.  Spleen:  Size and appearance within normal limits.  Right Kidney:  Length: 11.2 cm. Echogenicity within normal limits. No mass or hydronephrosis visualized.  Left Kidney:  Length: Left 0.6 cm. Echogenicity within normal limits. No significant mass or hydronephrosis visualized. 3.3 cm simple cyst.  Abdominal aorta:  No aneurysm visualized.  Other findings:  None.  IMPRESSION: No significant abnormality.   Electronically Signed   By: Maisie Fushomas  Register   On: 06/16/2014 07:54   Dg Abd 2 Views  06/16/2014   CLINICAL DATA:  Abdominal pain and distention. Elevated lipase of 1583.  EXAM: ABDOMEN - 2 VIEW  COMPARISON:  None.  FINDINGS: Mild prominence of a single proximal jejunal bowel loop in the left mid abdomen is suggestive of focal ileus from pancreatitis given the elevated lipase level. There is no evidence of small bowel obstruction or colonic dilatation. No free air is identified. No abnormal calcifications. Bony structures are unremarkable.  IMPRESSION: No evidence of bowel obstruction. A single mildly prominent proximal jejunal bowel loop is suggestive of focal ileus due to pancreatitis.   Electronically Signed   By: Irish LackGlenn  Yamagata M.D.   On: 06/16/2014 09:29   Admission HPI: [name] is a 43 year old male with DM2 presenting to the ED with complaint of severe abdominal pain. He claimed  he was feeling fine the day before and went to bed a little after midnight the night before and then woke up early with excruciating abdominal pain. He reported the pain to be 10/10, radiating across the upper part of his abdomen to his mid back, intermittent, no similar prior episodes, worse with movement. In fact, he said he has never been to the hospital. He endorsed eating [employer] for lunch the day before and chicken tenders for dinner and felt fine. In addition to the abdominal pain, he had 3 episodes of vomiting (yellow with some acid taste and dry heaving) and nausea. He did admit to  occasional alcohol use (last use was on the previous Saturday at a wedding reception, total 2-3 beers) but he is not a regular drinker. He also has never smoked cigarettes and denied any recent illicit drug use. He denied chest pain, dysuria, or shortness of breath. He had passed gas in the morning and had a regular BM yesterday. Of note, he did endorse being under a lot of stress lately with a work banquet coming up next month, his fiance getting a hysterectomy next week, and planning a wedding. He follows with a pcp in [place] and was recently started on metformin twice a day a few months ago, which he does not take regularly. He did not think he took any metformin the day before.   Hospital Course by problem list:  Acute Pancreatitis: Patient was admitted with severe epigastric pain, nausea and vomiting. Lipase on admission was 1583 on admission and trended down to 138 the following day. Abdominal ultrasounds showed no acute abnormality. The visualized portion of the pancreas was unremarkable. There were no gallstones or wall thickening visualized and no sonographic Murphy sign noted. Abdominal Xray showed no evidence of bowel obstruction. There was a single mildly prominent proximal jejunal bowel loop that was suggestive of focal ileus due to pancreatitis. We treated the patient with 2 Liters of NS IVF bolus and then aggressive NS IVF at 150 cc/hr. We also treated the patient with pain control with Dilaudid 1 mg IV Q4H and Zofran 4 mg IV prn nausea. We made him NPO and performed serial abdominal exams. The patient had improved greatly by the next day. He had required very little pain medication overnight and stated that his epigastric tenderness had improved. He was still slightly tender in the epigastric region on exam, but denied any nausea or vomiting. We advanced his diet to clear liquids, which he tolerated well for lunch. He continued to improve during the day and we advanced him to a soft diet for  lunch. The following day, the patient had no more epigastric pain or tenderness on exam. He had not required any pain medication or zofran overnight. He advanced his diet from soft to regular for breakfast, which he also tolerated. We were able to discontinue his IV fluids. We counseled the patient on the importance of avoiding excessive alcohol use since this could cause recurrent pancreatitis. As far as the etiology of his pancreatitis, we are unsure at this time what may have caused his symptoms. Patient denies excessive alcohol use. He is a social drinker, but only drinks 2-3 beers at a time and his use is occasional. There was no evidence on abdominal ultrasound of cholelithiasis or choledocholithiasis. His liver enzymes were also within normal limits. He was afebrile and without leukocytosis. Patient only takes Metformin at home and he admits he is inconsistent in his use. Metformin is a Class III  pancreatitis drug and we do not feel that is very likely that metformin could have caused his pancreatitis. The patient's triglyceride levels are also within normal limits, 149. He has no recent abdominal surgery, denies tobacco use, denies family history of pancreatitis, denies illicit drug use, or use of other medications other than Metformin.   DM Type II: Patient is on metformin 500 mg po bid at home which was started as a new medication a few months ago. He admits that he does not take his Metformin regularly. His glucose was 224 on admission. His last hemoglobin A1C was 7.7 on 06/16/2014. We transitioned him to sliding scale insulin sensitive during admission with cbg monitoring four times a day. His blood glucose ranged from 121 to 182 during his hospital stay. We discharged Ronnie Mejia on his home dose of metformin 500 mg po BID.   Discharge Vitals:   BP 121/75  Pulse 67  Temp(Src) 98.1 F (36.7 C) (Oral)  Resp 18  Ht 5\' 7"  (1.702 m)  Wt 204 lb 12.8 oz (92.897 kg)  BMI 32.07 kg/m2  SpO2  97%  Discharge Labs:  Results for orders placed during the hospital encounter of 06/16/14 (from the past 24 hour(s))  GLUCOSE, CAPILLARY     Status: Abnormal   Collection Time    06/17/14 11:55 AM      Result Value Ref Range   Glucose-Capillary 121 (*) 70 - 99 mg/dL  GLUCOSE, CAPILLARY     Status: Abnormal   Collection Time    06/17/14  4:53 PM      Result Value Ref Range   Glucose-Capillary 121 (*) 70 - 99 mg/dL  GLUCOSE, CAPILLARY     Status: Abnormal   Collection Time    06/17/14 10:37 PM      Result Value Ref Range   Glucose-Capillary 160 (*) 70 - 99 mg/dL   Comment 1 Notify RN     Comment 2 Documented in Chart    GLUCOSE, CAPILLARY     Status: Abnormal   Collection Time    06/18/14  7:59 AM      Result Value Ref Range   Glucose-Capillary 140 (*) 70 - 99 mg/dL    Signed: Jill Alexanders, DO PGY-1 Internal Medicine Resident Pager # [phone] 06/18/2014 11:27 AM

## 2014-06-18 NOTE — Progress Notes (Signed)
Subjective:  Patient was seen and examined this morning. He states that he is doing well. He denies any abdominal or epigastric pain, nausea, vomiting or diarrhea. He did have one regular bowel movement this morning. He tolerated his "soft diet" last night, though it consisted of pork chops, mashed potatoes and carrots. He did not have any pain or nausea during or after the meal. His headache has resolved. He denies any fever, chills, chest pain, shortness of breath.  Objective: Vital signs in last 24 hours: Filed Vitals:   06/17/14 1951 06/17/14 2236 06/18/14 0500 06/18/14 0544  BP:  113/77  121/75  Pulse: 78 65  67  Temp:  98.3 F (36.8 C)  98.1 F (36.7 C)  TempSrc:  Oral  Oral  Resp: 18 18  18   Height:      Weight:   204 lb 12.8 oz (92.897 kg)   SpO2:  95%  97%   Weight change: -1.6 oz (-0.045 kg)  Intake/Output Summary (Last 24 hours) at 06/18/14 0807 Last data filed at 06/18/14 0600  Gross per 24 hour  Intake 4165.83 ml  Output      0 ml  Net 4165.83 ml    General: Vital signs reviewed.  Patient is well-developed and well-nourished, in no acute distress and cooperative with exam.  Cardiovascular: RRR, S1 normal, S2 normal, no murmurs, gallops, or rubs. Pulmonary/Chest: Clear to auscultation bilaterally, no wheezes, rales, or rhonchi. Abdominal: Soft, no tenderness to palpation in epigastric pain, nontender in all 4 quadrants, distended, normoactive BS , no masses, organomegaly, or guarding present.  Musculoskeletal: No joint deformities, erythema, or stiffness, ROM full and nontender. Extremities: No lower extremity edema bilaterally,  pulses symmetric and intact bilaterally. No cyanosis or clubbing. Neurological: A&O x3, Strength is normal and symmetric bilaterally, no focal motor deficit, sensory intact to light touch bilaterally.  Skin: Warm, dry and intact. No rashes or erythema. Psychiatric: Normal mood and affect. speech and behavior is normal. Cognition and  memory are normal.   Lab Results: Basic Metabolic Panel:  Recent Labs Lab 06/16/14 0546 06/17/14 0410  NA 140 139  K 3.8 3.7  CL 103 103  CO2 23 26  GLUCOSE 224* 130*  BUN 12 8  CREATININE 0.76 0.80  CALCIUM 8.7 8.3*   Liver Function Tests:  Recent Labs Lab 06/16/14 0546  AST 25  ALT 47  ALKPHOS 55  BILITOT 0.5  PROT 7.2  ALBUMIN 3.7    Recent Labs Lab 06/16/14 0546 06/17/14 0410  LIPASE 1583* 138*   CBC:  Recent Labs Lab 06/16/14 0546  WBC 7.7  NEUTROABS 5.0  HGB 14.9  HCT 42.9  MCV 90.9  PLT 198   CBG:  Recent Labs Lab 06/16/14 2209 06/17/14 0752 06/17/14 1155 06/17/14 1653 06/17/14 2237 06/18/14 0759  GLUCAP 172* 151* 121* 121* 160* 140*   Hemoglobin A1C:  Recent Labs Lab 06/16/14 0818  HGBA1C 7.7*   Fasting Lipid Panel:  Recent Labs Lab 06/16/14 0835  CHOL 134  HDL 30*  LDLCALC 74  TRIG 161  CHOLHDL 4.5   Urine Drug Screen: Drugs of Abuse     Component Value Date/Time   LABOPIA NONE DETECTED 06/16/2014 0837   COCAINSCRNUR NONE DETECTED 06/16/2014 0837   LABBENZ NONE DETECTED 06/16/2014 0837   AMPHETMU NONE DETECTED 06/16/2014 0837   THCU NONE DETECTED 06/16/2014 0837   LABBARB NONE DETECTED 06/16/2014 0837    Alcohol Level:  Recent Labs Lab 06/16/14 0835  ETH <11  Urinalysis:  Recent Labs Lab 06/16/14 0818  COLORURINE YELLOW  LABSPEC 1.027  PHURINE 6.0  GLUCOSEU >1000*  HGBUR NEGATIVE  BILIRUBINUR NEGATIVE  KETONESUR NEGATIVE  PROTEINUR 30*  UROBILINOGEN 0.2  NITRITE NEGATIVE  LEUKOCYTESUR NEGATIVE   Studies/Results: Dg Abd 2 Views  06/16/2014   CLINICAL DATA:  Abdominal pain and distention. Elevated lipase of 1583.  EXAM: ABDOMEN - 2 VIEW  COMPARISON:  None.  FINDINGS: Mild prominence of a single proximal jejunal bowel loop in the left mid abdomen is suggestive of focal ileus from pancreatitis given the elevated lipase level. There is no evidence of small bowel obstruction or colonic dilatation. No  free air is identified. No abnormal calcifications. Bony structures are unremarkable.  IMPRESSION: No evidence of bowel obstruction. A single mildly prominent proximal jejunal bowel loop is suggestive of focal ileus due to pancreatitis.   Electronically Signed   By: Irish LackGlenn  Yamagata M.D.   On: 06/16/2014 09:29   Medications: I have reviewed the patient's current medications.  Prescriptions prior to admission  Medication Sig Dispense Refill  . metFORMIN (GLUCOPHAGE) 500 MG tablet Take 500 mg by mouth 2 (two) times daily with a meal.         Scheduled Meds: . enoxaparin (LOVENOX) injection  40 mg Subcutaneous Q24H  . insulin aspart  0-9 Units Subcutaneous TID WC  . ondansetron (ZOFRAN) IV  4 mg Intravenous Once   Continuous Infusions: . sodium chloride 150 mL/hr at 06/18/14 0630   PRN Meds:.ibuprofen, ondansetron (ZOFRAN) IV Assessment/Plan:  Acute pancreatitis: Unclear etiology. It does not appear to be secondary to recent alcohol use, gallstones or hypertriglyceridemia. Patient did not require any pain medications over night. His epigastric pain has resolved and he is tolerating a soft/regular diet. He denies any nausea, vomiting or diarrhea. Patient can likely be discharged home today. -Resolved -NS IV 150 mL/hr -Regular diet  -Zofran 4 mg IV Q8H prn nausea and vomiting   DM2: on metformin 500mg  po bid at home. Glucose 224 on admission. Hx of non-compliance with metformin. Hemoglobin A1C 7.7 on 06/16/2014. Blood glucose has ranged from 121 to 182 during his hospital stay. -hold metformin  -ssi sensitive  -cbg monitoring   Headache: Yesterday, patient had a 5/10 constant pressure-like frontal headache without radiation. No photophobia or phonophobia. Patient was given tramadol 50 mg once without relief. Patient was then given ibuprofen 400 mg Q4H prn pain, and did not require more than one dose. He reports a 0/10 headache today. -Resolved  Diet: Regular Diet  DVT Ppx:  Lovenox  Dispo: Disposition is deferred at this time, awaiting improvement of current medical problems.  Anticipated discharge in approximately 1-2 day(s).   The patient does have a current PCP Geoffery Lyons(Eric M Turner, MD) and [facility] discharge.  The patient does not have transportation limitations that hinder transportation to clinic appointments.  .Services Needed at time of discharge: Y = Yes, Blank = No PT:   OT:   RN:   Equipment:   Other:     LOS: 2 days   Jill AlexandersAlexa Richardson, DO PGY-1 Internal Medicine Resident Pager # [phone] 06/18/2014 8:07 AM

## 2014-06-18 NOTE — Progress Notes (Signed)
Subjective: [name] was seen and examined this morning and states that he is "feeling fine" and ready to go home. He denies any nausea, vomiting, or diarrhea and endorses only a minimal amount of abdominal pain. He tolerated his "soft diet" last night of pork chops and mashed potatoes well. This morning he had a bowel movement and says that this headache from yesterday is much improved after he slept last night.  Objective: Vital signs in last 24 hours: Filed Vitals:   06/17/14 1951 06/17/14 2236 06/18/14 0500 06/18/14 0544  BP:  113/77  121/75  Pulse: 78 65  67  Temp:  98.3 F (36.8 C)  98.1 F (36.7 C)  TempSrc:  Oral  Oral  Resp: 18 18  18   Height:      Weight:   92.897 kg (204 lb 12.8 oz)   SpO2:  95%  97%   Weight change: -0.045 kg (-1.6 oz)  Intake/Output Summary (Last 24 hours) at 06/18/14 0829 Last data filed at 06/18/14 0600  Gross per 24 hour  Intake 4165.83 ml  Output      0 ml  Net 4165.83 ml   General appearance: alert, cooperative and no distress Lungs: clear to auscultation bilaterally Heart: regular rate and rhythm, S1, S2 normal, no murmur, click, rub or gallop Abdomen: soft, non-tender; bowel sounds normal; no masses,  no organomegaly and Abdomen is soft with no distention, or pain on exam. Extremities: extremities normal, atraumatic, no cyanosis or edema Lab Results: Basic Metabolic Panel:  Recent Labs Lab 06/16/14 0546 06/17/14 0410  NA 140 139  K 3.8 3.7  CL 103 103  CO2 23 26  GLUCOSE 224* 130*  BUN 12 8  CREATININE 0.76 0.80  CALCIUM 8.7 8.3*   Liver Function Tests:  Recent Labs Lab 06/16/14 0546  AST 25  ALT 47  ALKPHOS 55  BILITOT 0.5  PROT 7.2  ALBUMIN 3.7    Recent Labs Lab 06/16/14 0546 06/17/14 0410  LIPASE 1583* 138*   No results found for this basename: AMMONIA,  in the last 168 hours CBC:  Recent Labs Lab 06/16/14 0546  WBC 7.7  NEUTROABS 5.0  HGB 14.9  HCT 42.9  MCV 90.9  PLT 198     Recent  Labs Lab 06/16/14 2209 06/17/14 0752 06/17/14 1155 06/17/14 1653 06/17/14 2237 06/18/14 0759  GLUCAP 172* 151* 121* 121* 160* 140*   Hemoglobin A1C:  Recent Labs Lab 06/16/14 0818  HGBA1C 7.7*   Fasting Lipid Panel:  Recent Labs Lab 06/16/14 0835  CHOL 134  HDL 30*  LDLCALC 74  TRIG 098149  CHOLHDL 4.5   Urine Drug Screen: Drugs of Abuse     Component Value Date/Time   LABOPIA NONE DETECTED 06/16/2014 0837   COCAINSCRNUR NONE DETECTED 06/16/2014 0837   LABBENZ NONE DETECTED 06/16/2014 0837   AMPHETMU NONE DETECTED 06/16/2014 0837   THCU NONE DETECTED 06/16/2014 0837   LABBARB NONE DETECTED 06/16/2014 0837    Alcohol Level:  Recent Labs Lab 06/16/14 0835  ETH <11   Urinalysis:  Recent Labs Lab 06/16/14 0818  COLORURINE YELLOW  LABSPEC 1.027  PHURINE 6.0  GLUCOSEU >1000*  HGBUR NEGATIVE  BILIRUBINUR NEGATIVE  KETONESUR NEGATIVE  PROTEINUR 30*  UROBILINOGEN 0.2  NITRITE NEGATIVE  LEUKOCYTESUR NEGATIVE     Micro Results: No results found for this or any previous visit (from the past 240 hour(s)). Studies/Results: Dg Abd 2 Views  06/16/2014   CLINICAL DATA:  Abdominal pain and distention. Elevated  lipase of 1583.  EXAM: ABDOMEN - 2 VIEW  COMPARISON:  None.  FINDINGS: Mild prominence of a single proximal jejunal bowel loop in the left mid abdomen is suggestive of focal ileus from pancreatitis given the elevated lipase level. There is no evidence of small bowel obstruction or colonic dilatation. No free air is identified. No abnormal calcifications. Bony structures are unremarkable.  IMPRESSION: No evidence of bowel obstruction. A single mildly prominent proximal jejunal bowel loop is suggestive of focal ileus due to pancreatitis.   Electronically Signed   By: Irish Lack M.D.   On: 06/16/2014 09:29   Medications: I have reviewed the patient's current medications. Scheduled Meds: . enoxaparin (LOVENOX) injection  40 mg Subcutaneous Q24H  . insulin aspart   0-9 Units Subcutaneous TID WC         Continuous Infusions:  PRN Meds:.ibuprofen, ondansetron (ZOFRAN) IV Assessment/Plan: Principal Problem:   Acute pancreatitis Active Problems:   DM2 (diabetes mellitus, type 2)   Ileus  Pancreatitis:  [name] is recovering quickly and given is low pain levels, lack of nausea, and toleration of a normal diet he should be ready to go home today.  Pain:  His pain is currently reasonably well controlled no medication. PRN ibuprofen should be all he needs for pain control.  DMII:  [name]'s blood sugars have been moderately elevated during his hospital course. He is currently on sliding scale insulin. His Metformin was discontinued at hospitalization but he should be ready to restart this medication on discharge.  PPx: Patient is receiving lovenox injections for DVT prophylaxis.  Discharge: [name] is ready to go home today. His presenting symptoms are almost completely resolved and he is tolerating a normal diet. There are no concerns that need to be addressed prior to discharge.    This is a Psychologist, occupational Note.  The care of the patient was discussed with Dr. Senaida Ores and the assessment and plan formulated with their assistance.  Please see their attached note for official documentation of the daily encounter.   LOS: 2 days   Chiquita Loth, Med Student 06/18/2014, 8:29 AM

## 2014-06-28 ENCOUNTER — Ambulatory Visit: Payer: Self-pay | Admitting: Physician Assistant

## 2014-07-09 ENCOUNTER — Other Ambulatory Visit: Payer: Self-pay | Admitting: Internal Medicine

## 2014-07-09 DIAGNOSIS — M25619 Stiffness of unspecified shoulder, not elsewhere classified: Secondary | ICD-10-CM

## 2014-07-09 DIAGNOSIS — M25511 Pain in right shoulder: Secondary | ICD-10-CM

## 2014-07-13 ENCOUNTER — Other Ambulatory Visit: Payer: Self-pay | Admitting: Internal Medicine

## 2014-07-13 ENCOUNTER — Ambulatory Visit
Admission: RE | Admit: 2014-07-13 | Discharge: 2014-07-13 | Disposition: A | Discharge status: Home / Self Care | Payer: Managed Care, Other (non HMO) | Source: Ambulatory Visit | Attending: Internal Medicine | Admitting: Internal Medicine

## 2014-07-13 DIAGNOSIS — Z139 Encounter for screening, unspecified: Secondary | ICD-10-CM

## 2014-07-14 ENCOUNTER — Other Ambulatory Visit: Payer: Managed Care, Other (non HMO)

## 2014-07-15 ENCOUNTER — Ambulatory Visit
Admission: RE | Admit: 2014-07-15 | Discharge: 2014-07-15 | Disposition: A | Discharge status: Home / Self Care | Payer: Managed Care, Other (non HMO) | Source: Ambulatory Visit | Attending: Internal Medicine | Admitting: Internal Medicine

## 2014-07-15 DIAGNOSIS — M25619 Stiffness of unspecified shoulder, not elsewhere classified: Secondary | ICD-10-CM

## 2014-07-15 DIAGNOSIS — M25511 Pain in right shoulder: Secondary | ICD-10-CM

## 2015-10-03 ENCOUNTER — Encounter: Payer: Self-pay | Admitting: *Deleted

## 2015-10-06 ENCOUNTER — Ambulatory Visit: Payer: BLUE CROSS/BLUE SHIELD | Admitting: Anesthesiology

## 2015-10-06 ENCOUNTER — Encounter
Admission: RE | Disposition: A | Discharge status: Home / Self Care | Payer: Self-pay | Source: Ambulatory Visit | Attending: Otolaryngology

## 2015-10-06 ENCOUNTER — Encounter: Payer: Self-pay | Admitting: *Deleted

## 2015-10-06 ENCOUNTER — Ambulatory Visit
Admission: RE | Admit: 2015-10-06 | Discharge: 2015-10-06 | Disposition: A | Discharge status: Home / Self Care | Payer: BLUE CROSS/BLUE SHIELD | Source: Ambulatory Visit | Attending: Otolaryngology | Admitting: Otolaryngology

## 2015-10-06 DIAGNOSIS — J342 Deviated nasal septum: Secondary | ICD-10-CM | POA: Diagnosis present

## 2015-10-06 DIAGNOSIS — Z538 Procedure and treatment not carried out for other reasons: Secondary | ICD-10-CM | POA: Diagnosis not present

## 2015-10-06 DIAGNOSIS — J343 Hypertrophy of nasal turbinates: Secondary | ICD-10-CM | POA: Diagnosis present

## 2015-10-06 LAB — GLUCOSE, CAPILLARY
GLUCOSE-CAPILLARY: 142 mg/dL — AB (ref 65–99)
Glucose-Capillary: 160 mg/dL — ABNORMAL HIGH (ref 65–99)

## 2015-10-06 SURGERY — CANCELLED PROCEDURE
Anesthesia: General

## 2015-10-06 MED ORDER — LIDOCAINE HCL 4 % MT SOLN
OROMUCOSAL | Status: DC | PRN
  Administered 2015-10-06: 4 mL via TOPICAL

## 2015-10-06 MED ORDER — CEFAZOLIN SODIUM-DEXTROSE 2-3 GM-% IV SOLR
2.0000 g | Freq: Once | INTRAVENOUS | Status: AC
  Administered 2015-10-06: 2 g via INTRAVENOUS

## 2015-10-06 MED ORDER — FENTANYL CITRATE (PF) 100 MCG/2ML IJ SOLN
INTRAMUSCULAR | Status: DC | PRN
  Administered 2015-10-06: 100 ug via INTRAVENOUS

## 2015-10-06 MED ORDER — PROPOFOL 10 MG/ML IV BOLUS
INTRAVENOUS | Status: DC | PRN
  Administered 2015-10-06: 150 mg via INTRAVENOUS
  Administered 2015-10-06: 50 mg via INTRAVENOUS

## 2015-10-06 MED ORDER — DEXAMETHASONE SODIUM PHOSPHATE 4 MG/ML IJ SOLN
INTRAMUSCULAR | Status: DC | PRN
  Administered 2015-10-06: 10 mg via INTRAVENOUS

## 2015-10-06 MED ORDER — ACETAMINOPHEN 10 MG/ML IV SOLN
1000.0000 mg | Freq: Once | INTRAVENOUS | Status: AC
  Administered 2015-10-06: 1000 mg via INTRAVENOUS

## 2015-10-06 MED ORDER — OXYMETAZOLINE HCL 0.05 % NA SOLN
2.0000 | Freq: Once | NASAL | Status: AC
  Administered 2015-10-06: 2 via NASAL

## 2015-10-06 MED ORDER — GLYCOPYRROLATE 0.2 MG/ML IJ SOLN
INTRAMUSCULAR | Status: DC | PRN
  Administered 2015-10-06: 0.1 mg via INTRAVENOUS

## 2015-10-06 MED ORDER — SUCCINYLCHOLINE CHLORIDE 20 MG/ML IJ SOLN
INTRAMUSCULAR | Status: DC | PRN
  Administered 2015-10-06: 100 mg via INTRAVENOUS

## 2015-10-06 MED ORDER — ONDANSETRON HCL 4 MG/2ML IJ SOLN
INTRAMUSCULAR | Status: DC | PRN
  Administered 2015-10-06: 4 mg via INTRAVENOUS

## 2015-10-06 MED ORDER — LIDOCAINE HCL (CARDIAC) 20 MG/ML IV SOLN
INTRAVENOUS | Status: DC | PRN
Start: 2015-10-06 — End: 2015-10-06
  Administered 2015-10-06: 40 mg via INTRAVENOUS

## 2015-10-06 MED ORDER — ONDANSETRON HCL 4 MG/2ML IJ SOLN
4.0000 mg | Freq: Once | INTRAMUSCULAR | Status: DC | PRN
Start: 2015-10-06 — End: 2015-10-06

## 2015-10-06 MED ORDER — LACTATED RINGERS IV SOLN
INTRAVENOUS | Status: DC
  Administered 2015-10-06: 08:00:00 via INTRAVENOUS

## 2015-10-06 MED ORDER — MIDAZOLAM HCL 5 MG/5ML IJ SOLN
INTRAMUSCULAR | Status: DC | PRN
  Administered 2015-10-06: 2 mg via INTRAVENOUS

## 2015-10-06 MED ORDER — FENTANYL CITRATE (PF) 100 MCG/2ML IJ SOLN
25.0000 ug | INTRAMUSCULAR | Status: DC | PRN
Start: 2015-10-06 — End: 2015-10-06

## 2015-10-06 SURGICAL SUPPLY — 30 items
BLADE SURG 15 STRL LF DISP TIS (BLADE) IMPLANT
BLADE SURG 15 STRL SS (BLADE)
CANISTER SUCT 1200ML W/VALVE (MISCELLANEOUS) ×1 IMPLANT
COAG SUCT 10F 3.5MM HAND CTRL (MISCELLANEOUS) ×1 IMPLANT
DRAPE HEAD BAR (DRAPES) ×1 IMPLANT
DRESSING NASL FOAM PST OP SINU (MISCELLANEOUS) IMPLANT
DRSG NASAL FOAM POST OP SINU (MISCELLANEOUS)
GLOVE PI ULTRA LF STRL 7.5 (GLOVE) ×2 IMPLANT
GLOVE PI ULTRA NON LATEX 7.5 (GLOVE)
KIT ROOM TURNOVER OR (KITS) ×1 IMPLANT
NDL HYPO 25GX1X1/2 BEV (NEEDLE) ×1 IMPLANT
NEEDLE HYPO 25GX1X1/2 BEV (NEEDLE) IMPLANT
NS IRRIG 500ML POUR BTL (IV SOLUTION) ×1 IMPLANT
PACK DRAPE NASAL/ENT (PACKS) ×1 IMPLANT
PACKING NASAL EPIS 4X2.4 XEROG (MISCELLANEOUS) IMPLANT
PAD GROUND ADULT SPLIT (MISCELLANEOUS) ×1 IMPLANT
PATTIES SURGICAL .5 X3 (DISPOSABLE) ×1 IMPLANT
SPLINT NASAL SEPTAL BLV .50 ST (MISCELLANEOUS) ×1 IMPLANT
STRAP BODY AND KNEE 60X3 (MISCELLANEOUS) ×1 IMPLANT
SUT CHROMIC 3-0 (SUTURE)
SUT CHROMIC 3-0 KS 27XMFL CR (SUTURE)
SUT ETHILON 3-0 KS 30 BLK (SUTURE) ×1 IMPLANT
SUT ETHILON 4-0 (SUTURE)
SUT ETHILON 4-0 FS2 18XMFL BLK (SUTURE)
SUT PLAIN GUT 4-0 (SUTURE) ×1 IMPLANT
SUTURE CHRMC 3-0 KS 27XMFL CR (SUTURE) ×1 IMPLANT
SUTURE ETHLN 4-0 FS2 18XMF BLK (SUTURE) IMPLANT
SYR 3ML LL SCALE MARK (SYRINGE) ×1 IMPLANT
TOWEL OR 17X26 4PK STRL BLUE (TOWEL DISPOSABLE) ×1 IMPLANT
WATER STERILE IRR 500ML POUR (IV SOLUTION) IMPLANT

## 2015-10-06 NOTE — Op Note (Signed)
ENT Pt had reflux of bile noted at time of intubation. High risk of aspiration, so surgery is postponed. Will make sure he is doing well, and then reschedule surgery in future, with special attention to preventing reflux at time of surgery. Vernie MurdersPaul Falynn Ailey, MD

## 2015-10-06 NOTE — Anesthesia Procedure Notes (Signed)
Procedure Name: Intubation Date/Time: 10/06/2015 8:10 AM Performed by: Jimmy PicketAMYOT, Nusrat Encarnacion Pre-anesthesia Checklist: Patient identified and Emergency Drugs available Patient Re-evaluated:Patient Re-evaluated prior to inductionOxygen Delivery Method: Circle system utilized Preoxygenation: Pre-oxygenation with 100% oxygen Intubation Type: IV induction Ventilation: Mask ventilation with difficulty, Two handed mask ventilation required and Oral airway inserted - appropriate to patient size Grade View: Grade II Tube type: Oral Rae Tube size: 7.5 mm Number of attempts: 2 Airway Equipment and Method: Stylet and Video-laryngoscopy Placement Confirmation: ETT inserted through vocal cords under direct vision,  positive ETCO2 and breath sounds checked- equal and bilateral Tube secured with: Tape Dental Injury: Teeth and Oropharynx as per pre-operative assessment  Comments: Pt difficult mask due to beard. Able to mask with 2 practitioners. Yellow secretions noted in airway prior to Direct laryngoscopy. Unable to intubate with miller 3. Glidescope by Dr Francena HanlyStella without difficulty. 7.5 oral rae. ETT suctioned for small amount of yellowish secretions.

## 2015-10-06 NOTE — Discharge Instructions (Signed)
General Anesthesia, Adult, Care After Refer to this sheet in the next few weeks. These instructions provide you with information on caring for yourself after your procedure. Your health care provider may also give you more specific instructions. Your treatment has been planned according to current medical practices, but problems sometimes occur. Call your health care provider if you have any problems or questions after your procedure. WHAT TO EXPECT AFTER THE PROCEDURE After the procedure, it is typical to experience:  Sleepiness.  Nausea and vomiting. HOME CARE INSTRUCTIONS  For the first 24 hours after general anesthesia:  Have a responsible person with you.  Do not drive a car. If you are alone, do not take public transportation.  Do not drink alcohol.  Do not take medicine that has not been prescribed by your health care provider.  Do not sign important papers or make important decisions.  You may resume a normal diet and activities as directed by your health care provider.  Change bandages (dressings) as directed.  If you have questions or problems that seem related to general anesthesia, call the hospital and ask for the anesthetist or anesthesiologist on call. SEEK MEDICAL CARE IF:  You have nausea and vomiting that continue the day after anesthesia.  You develop a rash. SEEK IMMEDIATE MEDICAL CARE IF:   You have difficulty breathing.  You have chest pain.  You have any allergic problems.   This information is not intended to replace advice given to you by your health care provider. Make sure you discuss any questions you have with your health care provider.   Document Released: 01/21/2001 Document Revised: 11/05/2014 Document Reviewed: 02/13/2012 Elsevier Interactive Patient Education 2016 Elsevier Inc.   Aspiration Pneumonia Aspiration pneumonia is an infection in your lungs. It occurs when food, liquid, or stomach contents (vomit) are inhaled (aspirated) into  your lungs. When these things get into your lungs, swelling (inflammation) and infection can occur. This can make it difficult for you to breathe. Aspiration pneumonia is a serious condition and can be life threatening. RISK FACTORS Aspiration pneumonia is more likely to occur when a person's cough (gag) reflex or ability to swallow has been decreased. Some things that can do this include:   Having a brain injury or disease, such as stroke, seizures, Parkinson's disease, dementia, or amyotrophic lateral sclerosis (ALS).   Being given general anesthetic for procedures.   Being in a coma (unconscious).   Having a narrowing of the tube that carries food to the stomach (esophagus).   Drinking too much alcohol. If a person passes out and vomits, vomit can be swallowed into the lungs.   Taking certain medicines, such as tranquilizers or sedatives.  SIGNS AND SYMPTOMS   Coughing after swallowing food or liquids.   Breathing problems, such as wheezing or shortness of breath.   Bluish skin. This can be caused by lack of oxygen.   Coughing up food or mucus. The mucus might contain blood, greenish material, or yellowish-white fluid (pus).   Fever.   Chest pain.   Being more tired than usual (fatigue).   Sweating more than usual.   Bad breath.  DIAGNOSIS  A physical exam will be done. During the exam, the health care provider will listen to your lungs with a stethoscope to check for:   Crackling sounds in the lungs.  Decreased breath sounds.  A rapid heartbeat. Various tests may be ordered. These may include:   Chest X-ray.   CT scan.   Swallowing study.  This test looks at how food is swallowed and whether it goes into your breathing tube (trachea) or food pipe (esophagus).   Sputum culture. Saliva and mucus (sputum) are collected from the lungs or the tubes that carry air to the lungs (bronchi). The sputum is then tested for bacteria.   Bronchoscopy. This  test uses a flexible tube (bronchoscope) to see inside the lungs. TREATMENT  Treatment will usually include antibiotic medicines. Other medicines may also be used to reduce fever or pain. You may need to be treated in the hospital. In the hospital, your breathing will be carefully monitored. Depending on how well you are breathing, you may need to be given oxygen, or you may need breathing support from a breathing machine (ventilator). For people who fail a swallowing study, a feeding tube might be placed in the stomach, or they may be asked to avoid certain food textures or liquids when they eat. HOME CARE INSTRUCTIONS   Carefully follow any special eating instructions you were given, such as avoiding certain food textures or thickening liquids. This reduces the risk of developing aspiration pneumonia again.  Only take over-the-counter or prescription medicines as directed by your health care provider. Follow the directions carefully.   If you were prescribed antibiotics, take them as directed. Finish them even if you start to feel better.   Rest as instructed by your health care provider.   Keep all follow-up appointments with your health care provider.  SEEK MEDICAL CARE IF:   You develop worsening shortness of breath, wheezing, or difficulty breathing.   You develop a fever.   You have chest pain.  MAKE SURE YOU:   Understand these instructions.  Will watch your condition.  Will get help right away if you are not doing well or get worse.   This information is not intended to replace advice given to you by your health care provider. Make sure you discuss any questions you have with your health care provider.   Document Released: 08/12/2009 Document Revised: 10/20/2013 Document Reviewed: 04/02/2013 Elsevier Interactive Patient Education [employer]

## 2015-10-06 NOTE — Progress Notes (Addendum)
Dr. Francena HanlyStella bedside to speak with patient & wife. OK with MD for pt to begin po fluids. Pt remains stable, sats 95% on RA, lung sounds clear. Will continue to monitor.

## 2015-10-06 NOTE — Progress Notes (Signed)
Dr. Franc[facility]Stella [facility] speak with [facility]. MD ok with pt discharging to home. Instructions given by MD regarding what to look out for with aspiration pneumonia, patient & wife verbalize understanding; handout attached to AVS on aspiration pneumonia.

## 2015-10-06 NOTE — Anesthesia Postprocedure Evaluation (Signed)
Anesthesia Post Note  Patient: Ronnie DacostaRonnie G Guldin  Procedure(s) Performed: Procedure(s) (LRB): CANCELLED PROCEDURE (N/A)  Patient location during evaluation: PACU Anesthesia Type: General Level of consciousness: awake and alert, oriented and patient cooperative Pain management: satisfactory to patient Vital Signs Assessment: post-procedure vital signs reviewed and stable Respiratory status: spontaneous breathing, nonlabored ventilation and respiratory function stable Cardiovascular status: blood pressure returned to baseline and stable Postop Assessment: Adequate PO intake and No signs of nausea or vomiting Anesthetic complications: yes Anesthetic complication details: anesthesia complicationsComments: Pt aspirated scant gastric contents during induction (see progress note for details).  Case canceled, airway suctioned, and pt observed in PACU for 2 hours.  Pt remained stable in PACU, with SpO2 at 95% without oxygen.  Lung fields completely clear bilaterally, and pt remained comfortable.  Instructions given to Pt and [facility] if sx of fever, cough, wheezing, chest pain, or any other unusual symptoms develop.  Pt and wife understood and wished to go home.  Follow-up with Dr. Elenore RotaJuengel.    Cherly BeachStella, Mikena Masoner J

## 2015-10-06 NOTE — Progress Notes (Signed)
Induction Aspiration note:  [facility] for [facility] E[facility] procedure.  Pt taken to OR for GA with ETT.  Monitors on and O2 administered.  Smooth IV induction.  Difficult mask ventilation due to facial hair and thick tongue.  Oral airway placed, with adequate ventilation.  Pt relaxed with Sux.  Oral airway removed for DL and small amount of green fluid noted on oral airway.  Suctioned and placed in trendelenberg.  DL with Hyacinth MeekerMiller 3 x1 unsuccessful.  Mask airway replaced and pt ventilated.  Airway suctioned and produced small green gastric contents.  SpO2 down to 80, with good mask ventilation.  Called for Glidescope, and SpO2 improved to to 94.  VL x1 with Glidescope #4.  Small amount of green gastric contents noted on larynx and VC.  7.5 ETT placed with ease.  ETT suctioned x3 produced small volume green contents.  Case canceled.  Pt emerged with continuous suctioning and no more gastric contents in airway.  Extubated and to PACU for observation.

## 2015-10-06 NOTE — Anesthesia Preprocedure Evaluation (Signed)
Anesthesia Evaluation  Patient identified by MRN, date of birth, ID band  Reviewed: Allergy & Precautions, H&P , NPO status , Patient's Chart, lab work & pertinent test results  Airway Mallampati: II  TM Distance: >3 FB Neck ROM: full    Dental no notable dental hx.    Pulmonary sleep apnea ,    Pulmonary exam normal        Cardiovascular  Rhythm:regular Rate:Normal     Neuro/Psych    GI/Hepatic   Endo/Other  diabetes  Renal/GU      Musculoskeletal   Abdominal   Peds  Hematology   Anesthesia Other Findings   Reproductive/Obstetrics                             Anesthesia Physical Anesthesia Plan  ASA: II  Anesthesia Plan: General ETT   Post-op Pain Management:    Induction:   Airway Management Planned:   Additional Equipment:   Intra-op Plan:   Post-operative Plan:   Informed Consent: I have reviewed the patients History and Physical, chart, labs and discussed the procedure including the risks, benefits and alternatives for the proposed anesthesia with the patient or authorized representative who has indicated his/her understanding and acceptance.     Plan Discussed with: CRNA  Anesthesia Plan Comments:         Anesthesia Quick Evaluation

## 2015-10-06 NOTE — Progress Notes (Signed)
Dr. Elenore RotaJuengel at bedside to speak with patient & family.

## 2015-10-06 NOTE — H&P (Signed)
  H&P has been reviewed and no changes necessary. To be downloaded later. 

## 2015-10-06 NOTE — Transfer of Care (Signed)
Immediate Anesthesia Transfer of Care Note  Patient: Ronnie Mejia  Procedure(s) Performed: Procedure(s): CANCELLED PROCEDURE (N/A)  Patient Location: PACU  Anesthesia Type: General ETT  Level of Consciousness: awake, alert  and patient cooperative  Airway and Oxygen Therapy: Patient Spontanous Breathing and Patient connected to supplemental oxygen  Post-op Assessment: Post-op Vital signs reviewed, Patient's Cardiovascular Status Stable, Respiratory Function Stable, Patent Airway and No signs of Nausea or vomiting  Post-op Vital Signs: Reviewed and stable  Complications: No apparent anesthesia complications

## 2015-10-07 ENCOUNTER — Encounter: Payer: Self-pay | Admitting: Otolaryngology

## 2015-10-14 ENCOUNTER — Other Ambulatory Visit: Payer: Managed Care, Other (non HMO)

## 2015-10-14 ENCOUNTER — Encounter: Payer: Self-pay | Admitting: *Deleted

## 2015-10-14 NOTE — Patient Instructions (Signed)
  Your procedure is scheduled on: 10-17-15  Report to [facility] 2ND FLOOR To find out your arrival time please call [phone] between 1PM - 3PM on 10-14-15  Remember: Instructions that are not followed completely may result in serious medical risk, up to and including death, or upon the discretion of your surgeon and anesthesiologist your surgery may need to be rescheduled.    _X___ 1. Do not eat food or drink liquids after midnight. No gum chewing or hard candies.     _X___ 2. No Alcohol for 24 hours before or after surgery.   ____ 3. Bring all medications with you on the day of surgery if instructed.    ____ 4. Notify your doctor if there is any change in your medical condition     (cold, fever, infections).     Do not wear jewelry, make-up, hairpins, clips or nail polish.  Do not wear lotions, powders, or perfumes. You may wear deodorant.  Do not shave 48 hours prior to surgery. Men may shave face and neck.  Do not bring valuables to the hospital.    [facility] is not responsible for any belongings or valuables.               Contacts, dentures or bridgework may not be worn into surgery.  Leave your suitcase in the car. After surgery it may be brought to your room.  For patients admitted to the hospital, discharge time is determined by your treatment team.   Patients discharged the day of surgery will not be allowed to drive home.   Please read over the following fact sheets that you were given:     _X___ Take these medicines the morning of surgery with A SIP OF WATER:    1. PRILOSEC  2.   3.   4.  5.  6.  ____ Fleet Enema (as directed)   ____ Use CHG Soap as directed  ____ Use inhalers on the day of surgery  _X___ Stop metformin 2 days prior to surgery-LAST DOSE OF METFORMIN 10-14-15 (FRIDAY)   ____ Take 1/2 of usual insulin dose the night before surgery and none on the morning of surgery.   ____ Stop Coumadin/Plavix/aspirin-N/A  ____  Stop Anti-inflammatories-NO NSAIDS OR ASA PRODUCTS-TYLENOL OK   ____ Stop supplements until after surgery.    ____ Bring C-Pap to the hospital.

## 2015-10-14 NOTE — Pre-Procedure Instructions (Signed)
PT WAS PHONE INTERVIEW FOR UPCOMING SURGERY ON 10-17-15 SINCE HE ASPIRATED DURING INDUCTION AT [facility] ON 10-06-15 AND PROCEDURE WAS CANCELLED.  PT DENIED HAVING ANY SOB, COUGH, FEVER SINCE RECENT ASPIRATION SINCE THAT TIME.  THE ONLY THING THAT PT COMPLAINS OF IS HOARSENESS THAT WONT GO AWAY SINCE HIS INTUBATION ON 10-06-15.   ALL NOTES FROM [facility]HESIA AT [facility] PLACED ON CHART FOR ANESTHESIA [facility] AT [facility] TO REVIEW.

## 2015-10-17 ENCOUNTER — Encounter: Payer: Self-pay | Admitting: *Deleted

## 2015-10-17 ENCOUNTER — Ambulatory Visit: Payer: BLUE CROSS/BLUE SHIELD | Admitting: *Deleted

## 2015-10-17 ENCOUNTER — Encounter
Admission: RE | Disposition: A | Discharge status: Home / Self Care | Payer: Self-pay | Source: Ambulatory Visit | Attending: Otolaryngology

## 2015-10-17 ENCOUNTER — Ambulatory Visit
Admission: RE | Admit: 2015-10-17 | Discharge: 2015-10-17 | Disposition: A | Discharge status: Home / Self Care | Payer: BLUE CROSS/BLUE SHIELD | Source: Ambulatory Visit | Attending: Otolaryngology | Admitting: Otolaryngology

## 2015-10-17 DIAGNOSIS — J342 Deviated nasal septum: Secondary | ICD-10-CM | POA: Diagnosis present

## 2015-10-17 DIAGNOSIS — E119 Type 2 diabetes mellitus without complications: Secondary | ICD-10-CM | POA: Diagnosis not present

## 2015-10-17 DIAGNOSIS — K219 Gastro-esophageal reflux disease without esophagitis: Secondary | ICD-10-CM | POA: Insufficient documentation

## 2015-10-17 DIAGNOSIS — Z79899 Other long term (current) drug therapy: Secondary | ICD-10-CM | POA: Insufficient documentation

## 2015-10-17 DIAGNOSIS — J343 Hypertrophy of nasal turbinates: Secondary | ICD-10-CM | POA: Diagnosis not present

## 2015-10-17 DIAGNOSIS — G4733 Obstructive sleep apnea (adult) (pediatric): Secondary | ICD-10-CM | POA: Diagnosis not present

## 2015-10-17 DIAGNOSIS — I10 Essential (primary) hypertension: Secondary | ICD-10-CM | POA: Diagnosis not present

## 2015-10-17 HISTORY — DX: Other complications of anesthesia, initial encounter: T88.59XA

## 2015-10-17 HISTORY — DX: Adverse effect of unspecified anesthetic, initial encounter: T41.45XA

## 2015-10-17 HISTORY — PX: NASAL SEPTOPLASTY W/ TURBINOPLASTY: SHX2070

## 2015-10-17 HISTORY — DX: Dysphonia: R49.0

## 2015-10-17 LAB — GLUCOSE, CAPILLARY
Glucose-Capillary: 176 mg/dL — ABNORMAL HIGH (ref 65–99)
Glucose-Capillary: 149 mg/dL — ABNORMAL HIGH (ref 65–99)

## 2015-10-17 SURGERY — SEPTOPLASTY, NOSE, WITH NASAL TURBINATE REDUCTION
Anesthesia: General | Site: Nose | Laterality: Bilateral | Wound class: Clean Contaminated

## 2015-10-17 MED ORDER — FENTANYL CITRATE (PF) 100 MCG/2ML IJ SOLN
INTRAMUSCULAR | Status: AC
  Filled 2015-10-17: qty 2

## 2015-10-17 MED ORDER — ONDANSETRON HCL 4 MG/2ML IJ SOLN
INTRAMUSCULAR | Status: DC | PRN
  Administered 2015-10-17 (×2): 4 mg via INTRAVENOUS

## 2015-10-17 MED ORDER — BACITRACIN ZINC 500 UNIT/GM EX OINT
TOPICAL_OINTMENT | CUTANEOUS | Status: AC
  Filled 2015-10-17: qty 28.35

## 2015-10-17 MED ORDER — FENTANYL CITRATE (PF) 100 MCG/2ML IJ SOLN
INTRAMUSCULAR | Status: DC | PRN
  Administered 2015-10-17: 50 ug via INTRAVENOUS
  Administered 2015-10-17: 100 ug via INTRAVENOUS
  Administered 2015-10-17: 50 ug via INTRAVENOUS

## 2015-10-17 MED ORDER — HYDROCODONE-ACETAMINOPHEN 5-325 MG PO TABS
ORAL_TABLET | ORAL | Status: AC
  Filled 2015-10-17: qty 1

## 2015-10-17 MED ORDER — DEXAMETHASONE SODIUM PHOSPHATE 10 MG/ML IJ SOLN
INTRAMUSCULAR | Status: DC | PRN
  Administered 2015-10-17: 10 mg via INTRAVENOUS

## 2015-10-17 MED ORDER — HYDROCODONE-ACETAMINOPHEN 5-325 MG PO TABS
1.0000 | ORAL_TABLET | ORAL | Status: DC | PRN
  Administered 2015-10-17: 1 via ORAL

## 2015-10-17 MED ORDER — PHENYLEPHRINE HCL 10 % OP SOLN
OPHTHALMIC | Status: DC | PRN
  Administered 2015-10-17: 20 mL via NASAL

## 2015-10-17 MED ORDER — LIDOCAINE-EPINEPHRINE (PF) 1 %-1:200000 IJ SOLN
INTRAMUSCULAR | Status: DC | PRN
  Administered 2015-10-17: 6 mL

## 2015-10-17 MED ORDER — SODIUM CHLORIDE 0.9 % IV SOLN
INTRAVENOUS | Status: DC
  Administered 2015-10-17: 11:00:00 via INTRAVENOUS

## 2015-10-17 MED ORDER — SUGAMMADEX SODIUM 200 MG/2ML IV SOLN
INTRAVENOUS | Status: DC | PRN
  Administered 2015-10-17: 185 mg via INTRAVENOUS

## 2015-10-17 MED ORDER — SUCCINYLCHOLINE CHLORIDE 20 MG/ML IJ SOLN
INTRAMUSCULAR | Status: DC | PRN
  Administered 2015-10-17: 100 mg via INTRAVENOUS

## 2015-10-17 MED ORDER — ROCURONIUM BROMIDE 100 MG/10ML IV SOLN
INTRAVENOUS | Status: DC | PRN
  Administered 2015-10-17: 10 mg via INTRAVENOUS
  Administered 2015-10-17: 25 mg via INTRAVENOUS
  Administered 2015-10-17: 5 mg via INTRAVENOUS

## 2015-10-17 MED ORDER — PHENYLEPHRINE HCL 10 % OP SOLN
Freq: Once | OPHTHALMIC | Status: DC
  Filled 2015-10-17: qty 10

## 2015-10-17 MED ORDER — LIDOCAINE-EPINEPHRINE (PF) 1 %-1:200000 IJ SOLN
INTRAMUSCULAR | Status: AC
  Filled 2015-10-17: qty 30

## 2015-10-17 MED ORDER — PROPOFOL 10 MG/ML IV BOLUS
INTRAVENOUS | Status: DC | PRN
Start: 2015-10-17 — End: 2015-10-17
  Administered 2015-10-17: 200 mg via INTRAVENOUS

## 2015-10-17 MED ORDER — CEFAZOLIN SODIUM-DEXTROSE 2-3 GM-% IV SOLR
INTRAVENOUS | Status: AC
  Administered 2015-10-17: 2 g via INTRAVENOUS
  Filled 2015-10-17: qty 50

## 2015-10-17 MED ORDER — FENTANYL CITRATE (PF) 100 MCG/2ML IJ SOLN
25.0000 ug | INTRAMUSCULAR | Status: DC | PRN
  Administered 2015-10-17 (×2): 25 ug via INTRAVENOUS

## 2015-10-17 MED ORDER — MIDAZOLAM HCL 2 MG/2ML IJ SOLN
INTRAMUSCULAR | Status: DC | PRN
  Administered 2015-10-17: 2 mg via INTRAVENOUS

## 2015-10-17 MED ORDER — LIDOCAINE HCL (PF) 4 % IJ SOLN
INTRAMUSCULAR | Status: AC
  Filled 2015-10-17: qty 10

## 2015-10-17 MED ORDER — ONDANSETRON HCL 4 MG/2ML IJ SOLN: 4.0000 mg | Freq: Once | INTRAMUSCULAR | Status: DC | PRN

## 2015-10-17 MED ORDER — LIDOCAINE HCL (CARDIAC) 20 MG/ML IV SOLN
INTRAVENOUS | Status: DC | PRN
  Administered 2015-10-17: 100 mg via INTRAVENOUS

## 2015-10-17 MED ORDER — CEFAZOLIN SODIUM-DEXTROSE 2-3 GM-% IV SOLR
2.0000 g | Freq: Once | INTRAVENOUS | Status: AC
  Administered 2015-10-17: 2 g via INTRAVENOUS

## 2015-10-17 SURGICAL SUPPLY — 22 items
BLADE SURG 15 STRL LF DISP TIS (BLADE) ×1 IMPLANT
BLADE SURG 15 STRL SS (BLADE) ×3
CANISTER SUCT 1200ML W/VALVE (MISCELLANEOUS) ×3 IMPLANT
COAG SUCT 10F 3.5MM HAND CTRL (MISCELLANEOUS) ×3 IMPLANT
DRSG NASAL 4CM NASOPORE (MISCELLANEOUS) ×3 IMPLANT
GLOVE EXAM LX STRL 7.5 (GLOVE) ×3 IMPLANT
GOWN STRL REUS W/ TWL LRG LVL3 (GOWN DISPOSABLE) ×2 IMPLANT
GOWN STRL REUS W/TWL LRG LVL3 (GOWN DISPOSABLE) ×6
LABEL OR SOLS (LABEL) ×3 IMPLANT
NDL SPNL 25GX3.5 QUINCKE BL (NEEDLE) ×1 IMPLANT
NEEDLE SPNL 25GX3.5 QUINCKE BL (NEEDLE) ×3 IMPLANT
NS IRRIG 500ML POUR BTL (IV SOLUTION) ×3 IMPLANT
PACK HEAD/NECK (MISCELLANEOUS) ×3 IMPLANT
PAD GROUND ADULT SPLIT (MISCELLANEOUS) ×3 IMPLANT
PATTIES SURGICAL .5 X3 (DISPOSABLE) ×3 IMPLANT
SPLINT NASAL REUTER (MISCELLANEOUS) ×3 IMPLANT
SUT CHROMIC 3-0 (SUTURE) ×3
SUT CHROMIC 3-0 KS 27XMFL CR (SUTURE) ×1
SUT ETHILON 3-0 KS 30 BLK (SUTURE) ×3 IMPLANT
SUT PLAIN GUT 4-0 (SUTURE) ×3 IMPLANT
SUTURE CHRMC 3-0 KS 27XMFL CR (SUTURE) ×1 IMPLANT
SYR 3ML LL SCALE MARK (SYRINGE) ×3 IMPLANT

## 2015-10-17 NOTE — Anesthesia Postprocedure Evaluation (Signed)
Anesthesia Post Note  Patient: Ronnie Mejia  Procedure(s) Performed: Procedure(s) (LRB): Nasal Septoplasty, Bilateral Partial Reduction Inferior Turbinates  (Bilateral)  Patient location during evaluation: PACU Anesthesia Type: General Level of consciousness: awake and alert Pain management: pain level controlled Vital Signs Assessment: post-procedure vital signs reviewed and stable Respiratory status: spontaneous breathing Cardiovascular status: blood pressure returned to baseline Postop Assessment: no headache Anesthetic complications: no    Last Vitals:  Filed Vitals:   10/17/15 1215 10/17/15 1230  BP: 149/93 141/91  Pulse: 105   Temp: 36.6 C   Resp: 17     Last Pain:  Filed Vitals:   10/17/15 1303  PainSc: 5                  Gianne Shugars M

## 2015-10-17 NOTE — Op Note (Signed)
10/17/2015  12:03 PM  [id number]   Pre-Op Dx:  Deviated Nasal Septum, Hypertrophic Inferior Turbinates  Post-op Dx: Same  Proc: Nasal Septoplasty, Bilateral Partial Reduction Inferior Turbinates   Surg:  Tilton Marsalis H  Anes:  GOT  EBL:  50 mL  Comp:  None  Findings: Very large septal spur posteriorly of the vomer completely obstructing the airway on the right side. Some buckling of the ethmoid plate was read as well. The caudal tip buckled into the left nasal airway. Enlarged inferior turbinates.  Procedure: With the patient in a comfortable supine position,  general orotracheal anesthesia was induced without difficulty.     The patient received preoperative Afrin spray for topical decongestion and vasoconstriction.  Intravenous prophylactic antibiotics were administered.  At an appropriate level, the patient was placed in a semi-sitting position.  Nasal vibrissae were trimmed.   1% Xylocaine with 1:100,000 epinephrine, 10 cc's, was infiltrated into the anterior floor of the nose, into the nasal spine region, into the membranous columella, and finally into the submucoperichondrial plane of the septum on both sides.  Several minutes were allowed for this to take effect.  Cottoniod pledgetts soaked in Afrin and 4% Xylocaine were placed into both nasal cavities and left while the patient was prepped and draped in the standard fashion.  The materials were removed from the nose and observed to be intact and correct in number.  The nose was inspected with a headlight with the findings as described above.  A left hemitransfixion incision was sharply executed and carried down to the caudal edge of the quadrangular cartilage. The mucoperichondrium was elelvated along the quadrangular plate on both sides back to the bony-cartilaginous junction. The mucoperiostium was then elevated along the ethmoid plate and the vomer. The boney-catilaginous junction was then split with a freer elevator and the  mucoperiosteum was elevated on the opposite side. The mucoperiosteum was then elevated along the maxillary crest as needed to expose the crooked bone of the crest.  Boney spurs of the vomer and maxillary crest were removed with Lenoria Chime forceps.  The cartilaginous plate was trimmed along its posterior and inferior borders of about 2 mm of cartilage to free it up inferiorly. Some of the deviated ethmoid plate was then fractured and removed with Takahashi forceps to free up the posterior border of the quadrangular plate and allow it to swing back to the midline. The mucosal flaps were placed back into their anatomic position to allow visualization of the airways. The septum now sat in the midline with an improved airway.  A 3-0 Chromic suture on a Keith needle in used to anchor the inferior septum at the nasal spine with a through and through suture. The mucosal flaps are then sutured together using a through and through whip stitch of 4-0 Plain Gut with a mini-Keith needle. This was used to close the Scott incision as well. There was a tear mucosa at the posterior right septum where the spur was. This also was closed with the 40 plain gut.  The inferior turbinates were then inspected. An incision was created along the inferior aspect of the left inferior turbinate with removal of some of the inferior soft tissue and bone. Electrocautery was used to control bleeding in the area. The remaining turbinate was then outfractured to open up the airway further. There was no significant bleeding noted. The right turbinate was then trimmed and outfractured in a similar fashion.  The airways were then visualized and showed open passageways on both  sides that were significantly improved compared to before surgery. There was no signifcant bleeding. Nasal splints were applied to both sides of the septum using Xomed 0.255mm regular sized splints that were trimmed, and then held in position with a 3-0 Nylon through and  through suture.  The patient was turned back over to anesthesia, and awakened, extubated, and taken to the PACU in satisfactory condition.  Dispo:   PACU to home  Plan: Ice, elevation, narcotic analgesia, steroid taper, and prophylactic antibiotics for the duration of indwelling nasal foreign bodies.  We will reevaluate the patient in the office in 6 days and remove the septal splints.  Return to work in 10 days, strenuous activities in two weeks.   Ronnie Mejia H 10/17/2015 12:03 PM

## 2015-10-17 NOTE — Transfer of Care (Signed)
Immediate Anesthesia Transfer of Care Note  Patient: Ronnie Mejia  Procedure(s) Performed: Procedure(s): NASAL SEPTOPLASTY WITH TURBINATE REDUCTION (Bilateral)  Patient Location: PACU  Anesthesia Type:General  Level of Consciousness: sedated  Airway & Oxygen Therapy: Patient Spontanous Breathing and Patient connected to face mask oxygen  Post-op Assessment: Report given to RN and Post -op Vital signs reviewed and stable  Post vital signs: Reviewed and stable  Last Vitals:  Filed Vitals:   10/17/15 0927  BP: 125/82  Pulse: 69  Temp: 36.6 C  Resp: 16    Complications: No apparent anesthesia complications

## 2015-10-17 NOTE — Discharge Instructions (Signed)
AMBULATORY SURGERY  DISCHARGE INSTRUCTIONS   1) The drugs that you were given will stay in your system until tomorrow so for the next 24 hours you should not:  A) Drive an automobile B) Make any legal decisions C) Drink any alcoholic beverage   2) You may resume regular meals tomorrow.  Today it is better to start with liquids and gradually work up to solid foods.  You may eat anything you prefer, but it is better to start with liquids, then soup and crackers, and gradually work up to solid foods.   3) Please notify your doctor immediately if you have any unusual bleeding, trouble breathing, redness and pain at the surgery site, drainage, fever, or pain not relieved by medication.    4) Additional Instructions:        Please contact your physician with any problems or Same Day Surgery at [phone], Monday through Friday 6 am to 4 pm, or [place] at [facility] Main number at [phone].Septoplasty, Care After Refer to this sheet in the next few weeks. These instructions provide you with information about caring for yourself after your procedure. Your health care provider may also give you more specific instructions. Your treatment has been planned according to current medical practices, but problems sometimes occur. Call your health care provider if you have any problems or questions after your procedure. WHAT TO EXPECT AFTER THE PROCEDURE After your procedure, it is typical to have the following:  Mild headache.  Stuffy nose.  Feeling of fullness in your ears.  Bloody fluid coming from your nose. HOME CARE INSTRUCTIONS Medicines  If you were prescribed an antibiotic medicine, finish it all even if you start to feel better.  Take medicines only as directed by your health care provider.  You may be directed to clean your nostrils with a saline nasal spray. This will help to clear the crusts and blood clots in your nose. The solution can be found as an  over-the-counter solution or made at home as directed by your health care provider.  You may need to take laxatives or stool softeners as directed by your health care provider. What to Avoid  Do not blow your nose for 2 weeks after surgery or as directed by your health care provider.  Do not lift anything heavier than 10 lb (4.5 kg) for 2 weeks or as directed by your health care provider.  Avoid strenuous activities that can cause nosebleeds for 2 weeks. These include activities such as running or playing sports.  Avoid very hot or steamy showers for several days after surgery or as directed by your health care provider. Eating and Drinking  Avoid eating hot and spicy foods for several days after surgery or as directed by your health care provider.  Eat plenty of fiber to keep your stools soft, especially while you are taking pain medicine. This helps you to avoid straining, which can cause a nosebleed.  Drink enough fluid to keep your urine clear or pale yellow. General Instructions  Raise (elevate) your head while you are lying down.  Keep all follow-up visits as directed by your health care provider. This is important.  If you have nasal splints, follow your health care provider's instructions about removal.  If your nose was packed with gauze, follow your health care provider's instructions about removal. SEEK MEDICAL CARE IF:  You develop swelling or increased pain in your nose.  You have yellowish-white fluid (pus) coming from your nose.  You have severe  diarrhea.  You have ongoing (persistent) nausea.  You cannot breathe through your nose.  You have a fever. SEEK IMMEDIATE MEDICAL CARE IF:  You are short of breath.  You feel dizzy or you faint.  You have vision changes.  You are bleeding heavily from your nose.  You are vomiting.  You have a severe headache or a stiff neck.   This information is not intended to replace advice given to you by your health  care provider. Make sure you discuss any questions you have with your health care provider.   Document Released: 10/15/2005 Document Revised: 11/05/2014 Document Reviewed: 05/26/2014 Elsevier Interactive Patient Education [employer]

## 2015-10-17 NOTE — H&P (Signed)
  H&P has been reviewed and no changes necessary. To be downloaded later. 

## 2015-10-17 NOTE — Anesthesia Procedure Notes (Signed)
Procedure Name: Intubation Date/Time: 10/17/2015 10:57 AM Performed by: Michaele OfferSAVAGE, Norrin Shreffler Pre-anesthesia Checklist: Patient identified, Emergency Drugs available, Suction available, Patient being monitored and Timeout performed Patient Re-evaluated:Patient Re-evaluated prior to inductionOxygen Delivery Method: Circle system utilized Preoxygenation: Pre-oxygenation with 100% oxygen Intubation Type: IV induction, Rapid sequence and Cricoid Pressure applied Laryngoscope Size: Mac and 4 Grade View: Grade II Tube type: Oral Rae Tube size: 7.5 mm Number of attempts: 1 Airway Equipment and Method: Rigid stylet Placement Confirmation: ETT inserted through vocal cords under direct vision,  positive ETCO2 and breath sounds checked- equal and bilateral Tube secured with: Tape Dental Injury: Teeth and Oropharynx as per pre-operative assessment

## 2015-10-17 NOTE — Anesthesia Preprocedure Evaluation (Signed)
Anesthesia Evaluation  Patient identified by MRN, date of birth, ID band Patient awake    Reviewed: Allergy & Precautions, NPO status , Patient's Chart, lab work & pertinent test results  Airway Mallampati: II  TM Distance: >3 FB Neck ROM: Full    Dental  (+) Partial Upper   Pulmonary sleep apnea and Continuous Positive Airway Pressure Ventilation ,  Has CPAP, does not use it often, if at all.   Pulmonary exam normal        Cardiovascular Exercise Tolerance: Good (-) hypertensionNormal cardiovascular exam     Neuro/Psych    GI/Hepatic GERD  Medicated and Controlled,  Endo/Other  diabetes, Type 2BG 176.  Renal/GU      Musculoskeletal   Abdominal (+) + obese,  Abdomen: soft.    Peds  Hematology   Anesthesia Other Findings   Reproductive/Obstetrics                             Anesthesia Physical Anesthesia Plan  ASA: III  Anesthesia Plan: General   Post-op Pain Management:    Induction: Intravenous  Airway Management Planned: Oral ETT  Additional Equipment:   Intra-op Plan:   Post-operative Plan: Extubation in OR  Informed Consent: I have reviewed the patients History and Physical, chart, labs and discussed the procedure including the risks, benefits and alternatives for the proposed anesthesia with the patient or authorized representative who has indicated his/her understanding and acceptance.     Plan Discussed with: CRNA  Anesthesia Plan Comments: (RSI. Treated reflux, but had bile in his throat post induction if [place], Prior surgeries have been uneventful.)        Anesthesia Quick Evaluation

## 2015-10-20 ENCOUNTER — Ambulatory Visit: Admit: 2015-10-20 | Payer: Managed Care, Other (non HMO) | Admitting: Otolaryngology

## 2015-10-20 HISTORY — DX: Deviated nasal septum: J34.2

## 2015-10-20 HISTORY — DX: Essential (primary) hypertension: I10

## 2015-10-20 HISTORY — DX: Nasal congestion: R09.81

## 2015-10-20 SURGERY — SEPTOPLASTY, NOSE
Anesthesia: General

## 2016-04-30 IMAGING — US US ABDOMEN COMPLETE
1 series · 14 of 25 positions shown · non-contrast
Comparison: None.

CLINICAL DATA: Pancreatitis.

EXAM:
ULTRASOUND ABDOMEN COMPLETE

[Series 1: us abdomen complete · 0.31mm/px · 14 of 90 slices shown]
[im 1/90]
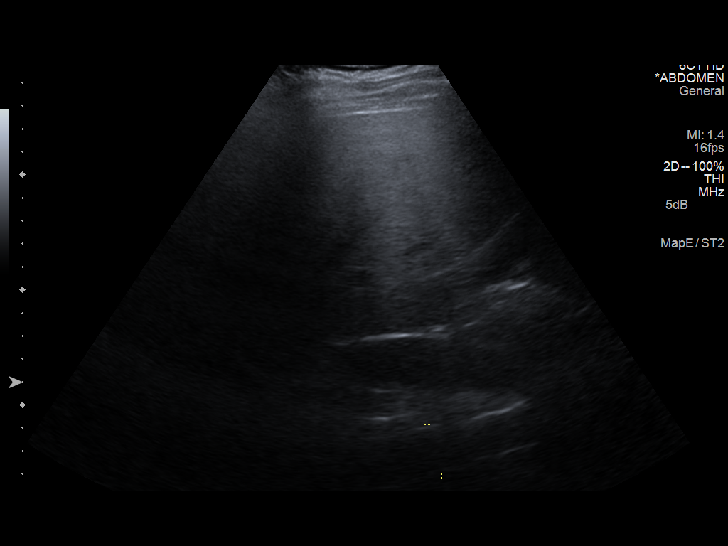
[im 8/90]
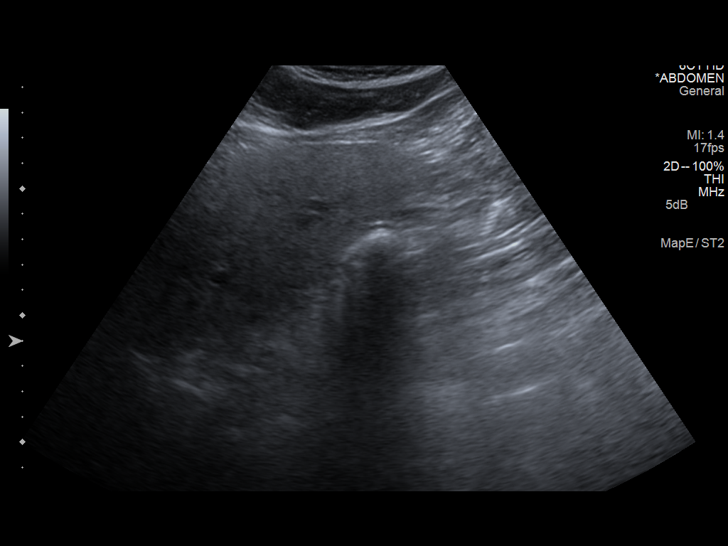
[im 15/90]
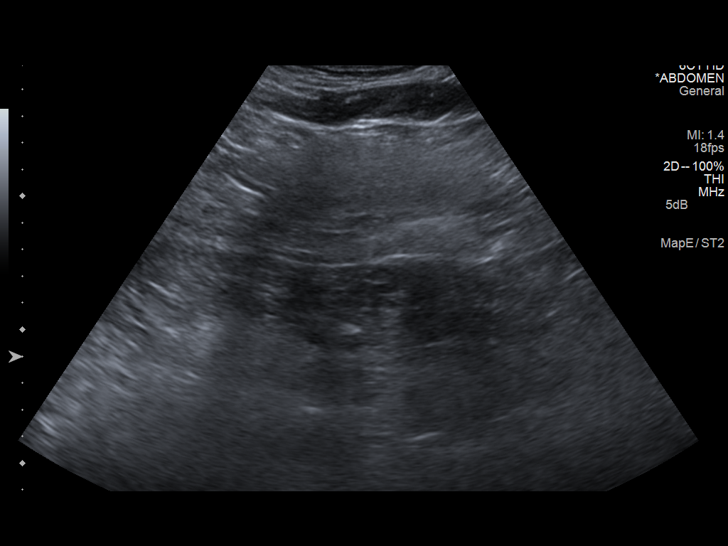
[im 23/90]
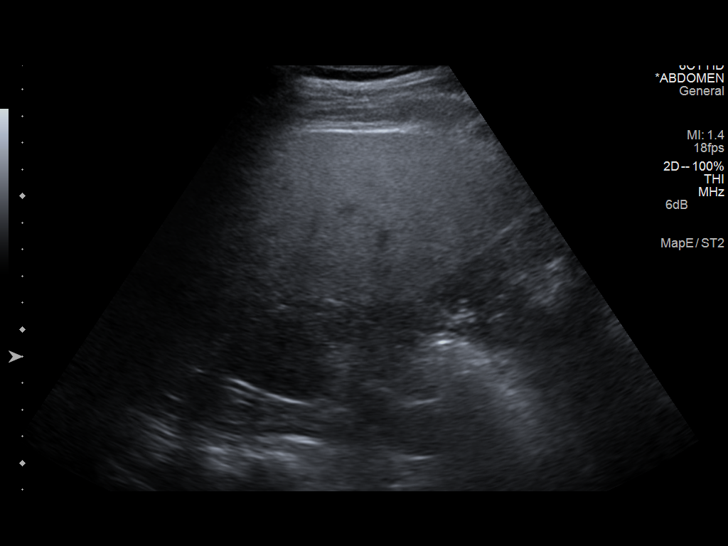
[im 30/90]
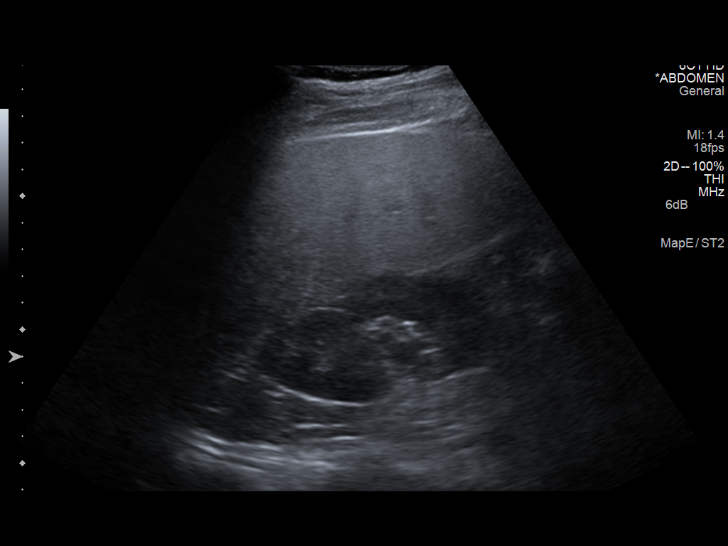
[im 34/90]
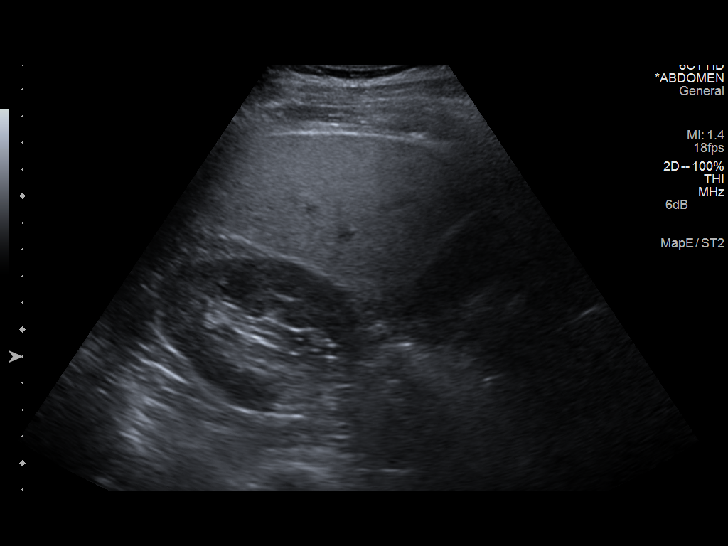
[im 41/90]
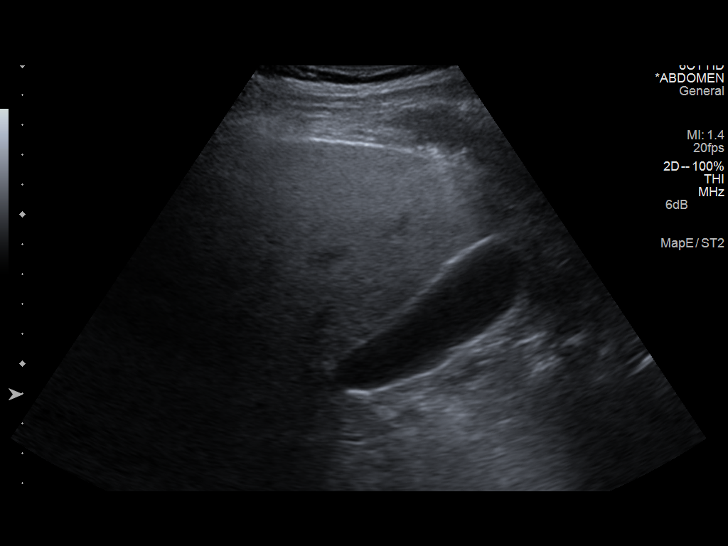
[im 49/90]
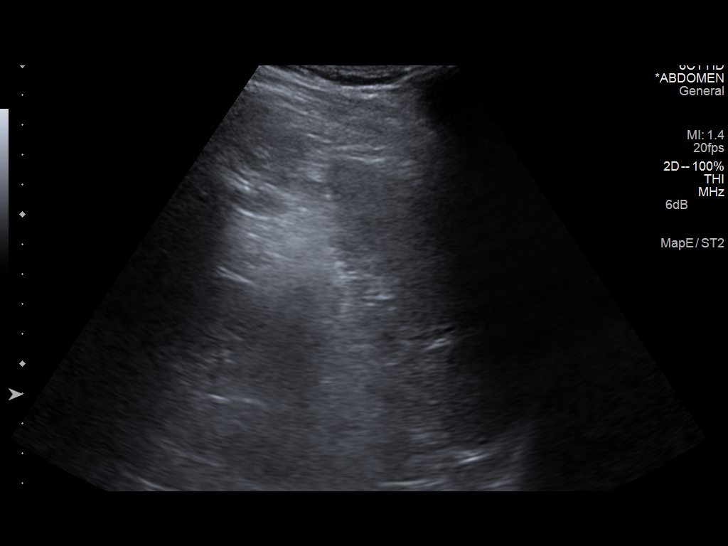
[im 56/90]
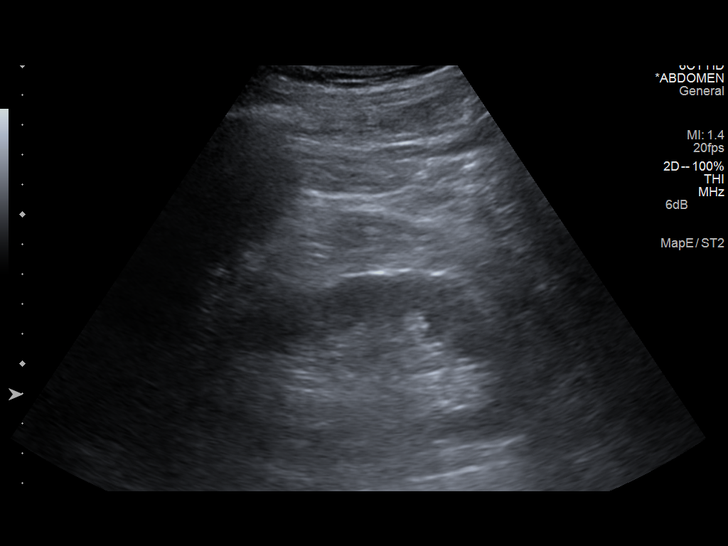
[im 60/90]
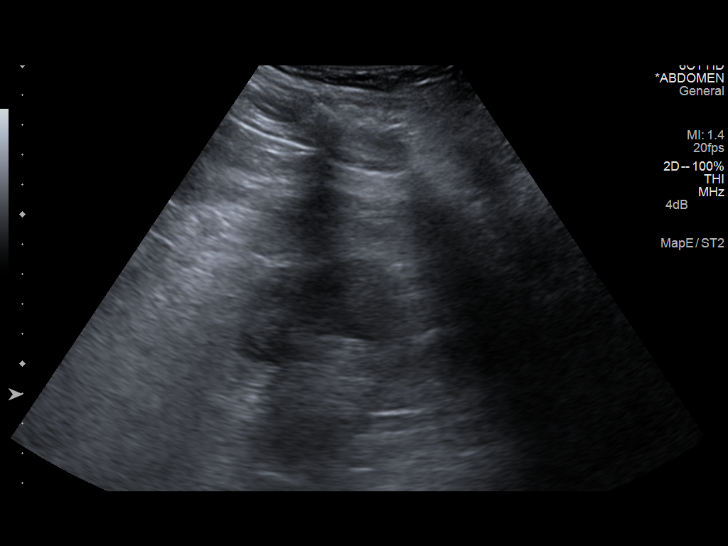
[im 67/90]
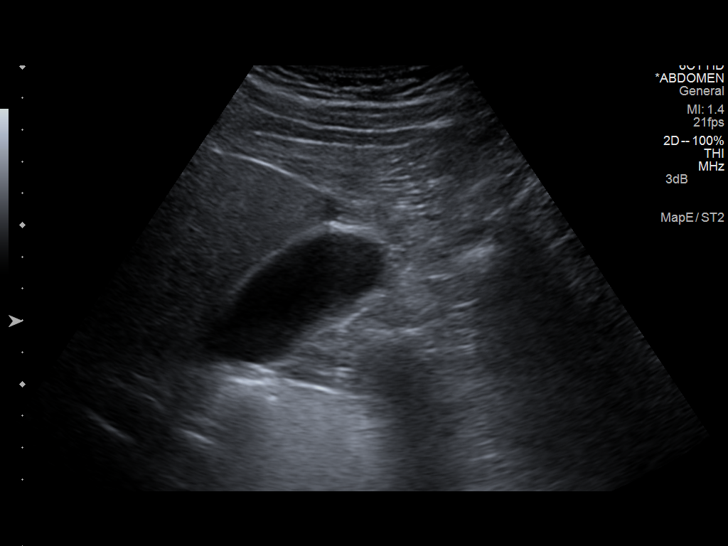
[im 75/90]
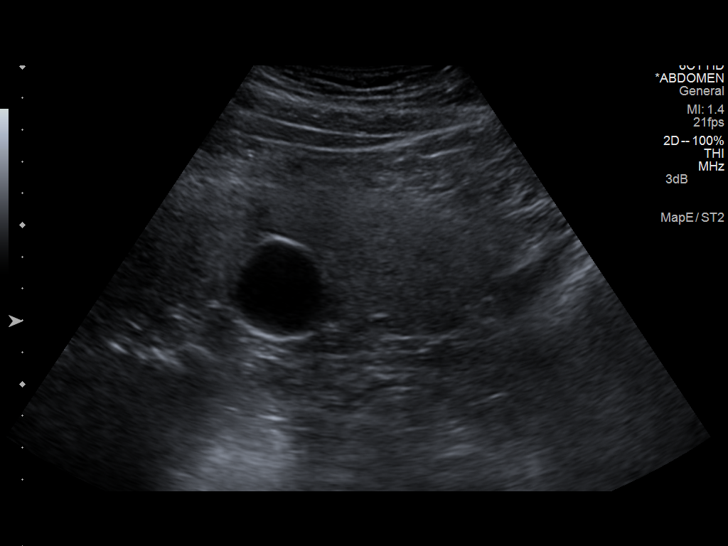
[im 82/90]
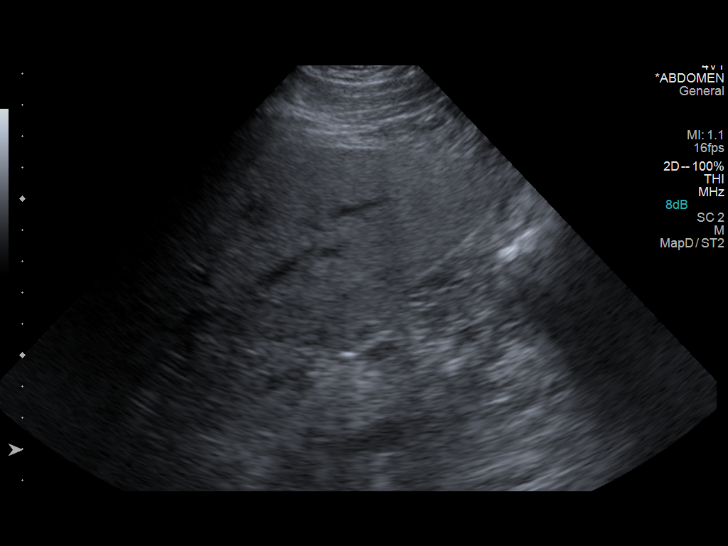
[im 90/90]
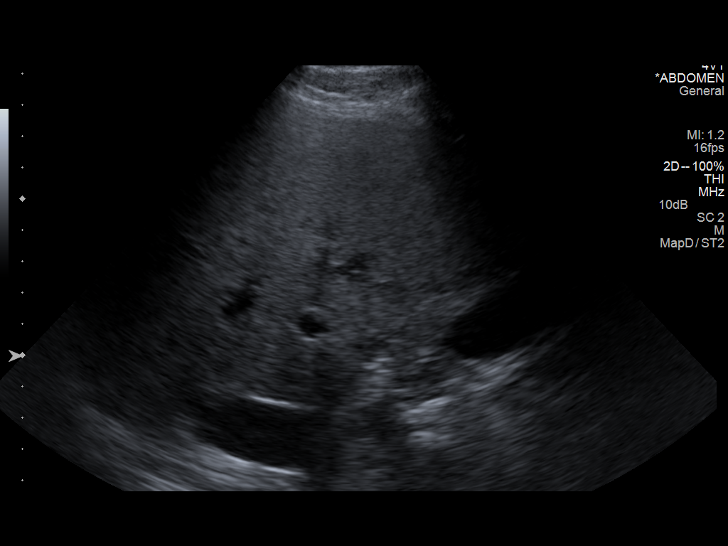

[14 of 25 positions shown; findings below may reference images not displayed]

FINDINGS: Gallbladder:

No gallstones or wall thickening visualized. No sonographic Murphy
sign noted.

Common bile duct:

Diameter: 4.3 mm

Liver:

No focal lesion identified. Within normal limits in parenchymal
echogenicity.

IVC:

No abnormality visualized.

Pancreas:

Visualized portion unremarkable.

Spleen:

Size and appearance within normal limits.

Right Kidney:

Length: 11.2 cm. Echogenicity within normal limits. No mass or
hydronephrosis visualized.

Left Kidney:

Length: Left 0.6 cm. Echogenicity within normal limits. No
significant mass or hydronephrosis visualized. 3.3 cm simple cyst.

Abdominal aorta:

No aneurysm visualized.

Other findings:

None.
IMPRESSION: No significant abnormality.

## 2016-04-30 IMAGING — CR DG ABDOMEN 2V
2 series · 2 of 2 positions shown · non-contrast
Comparison: None.

CLINICAL DATA: Abdominal pain and distention. Elevated lipase of
3922.

EXAM:
ABDOMEN - 2 VIEW

[w abdomen upright]
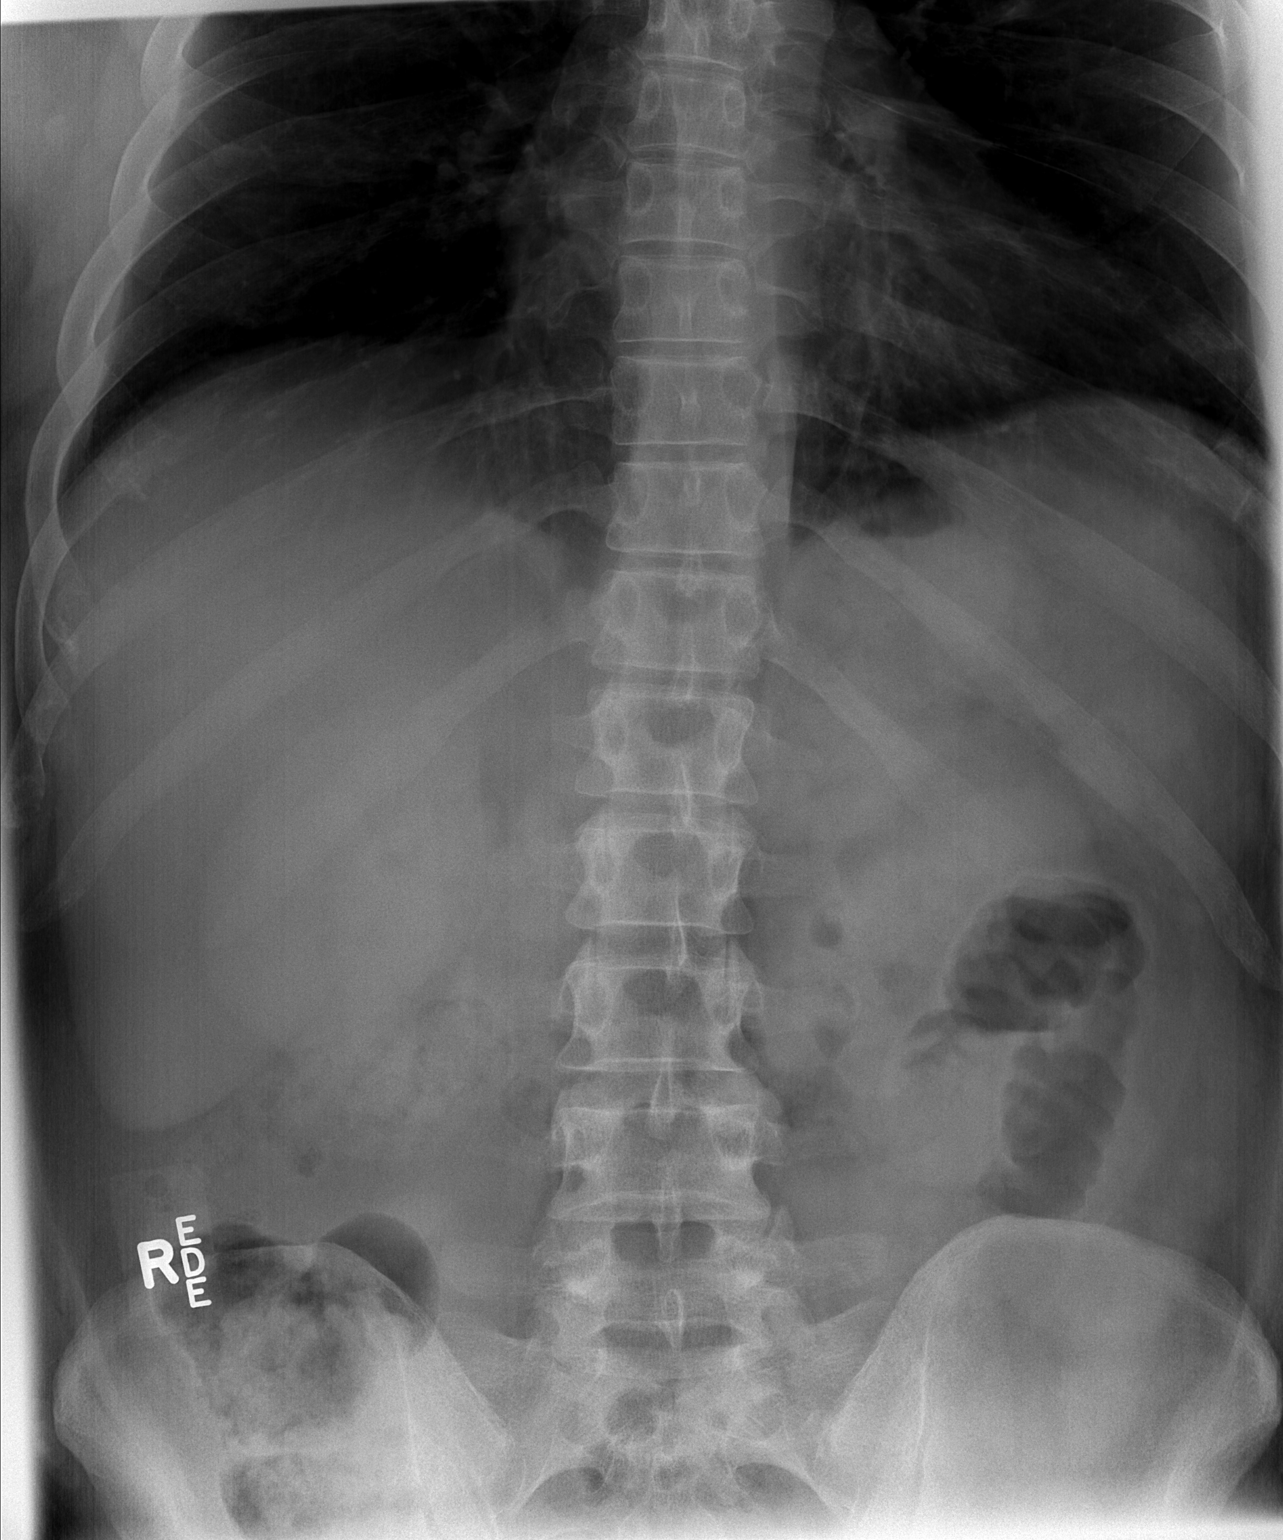

[t abdomen supine]
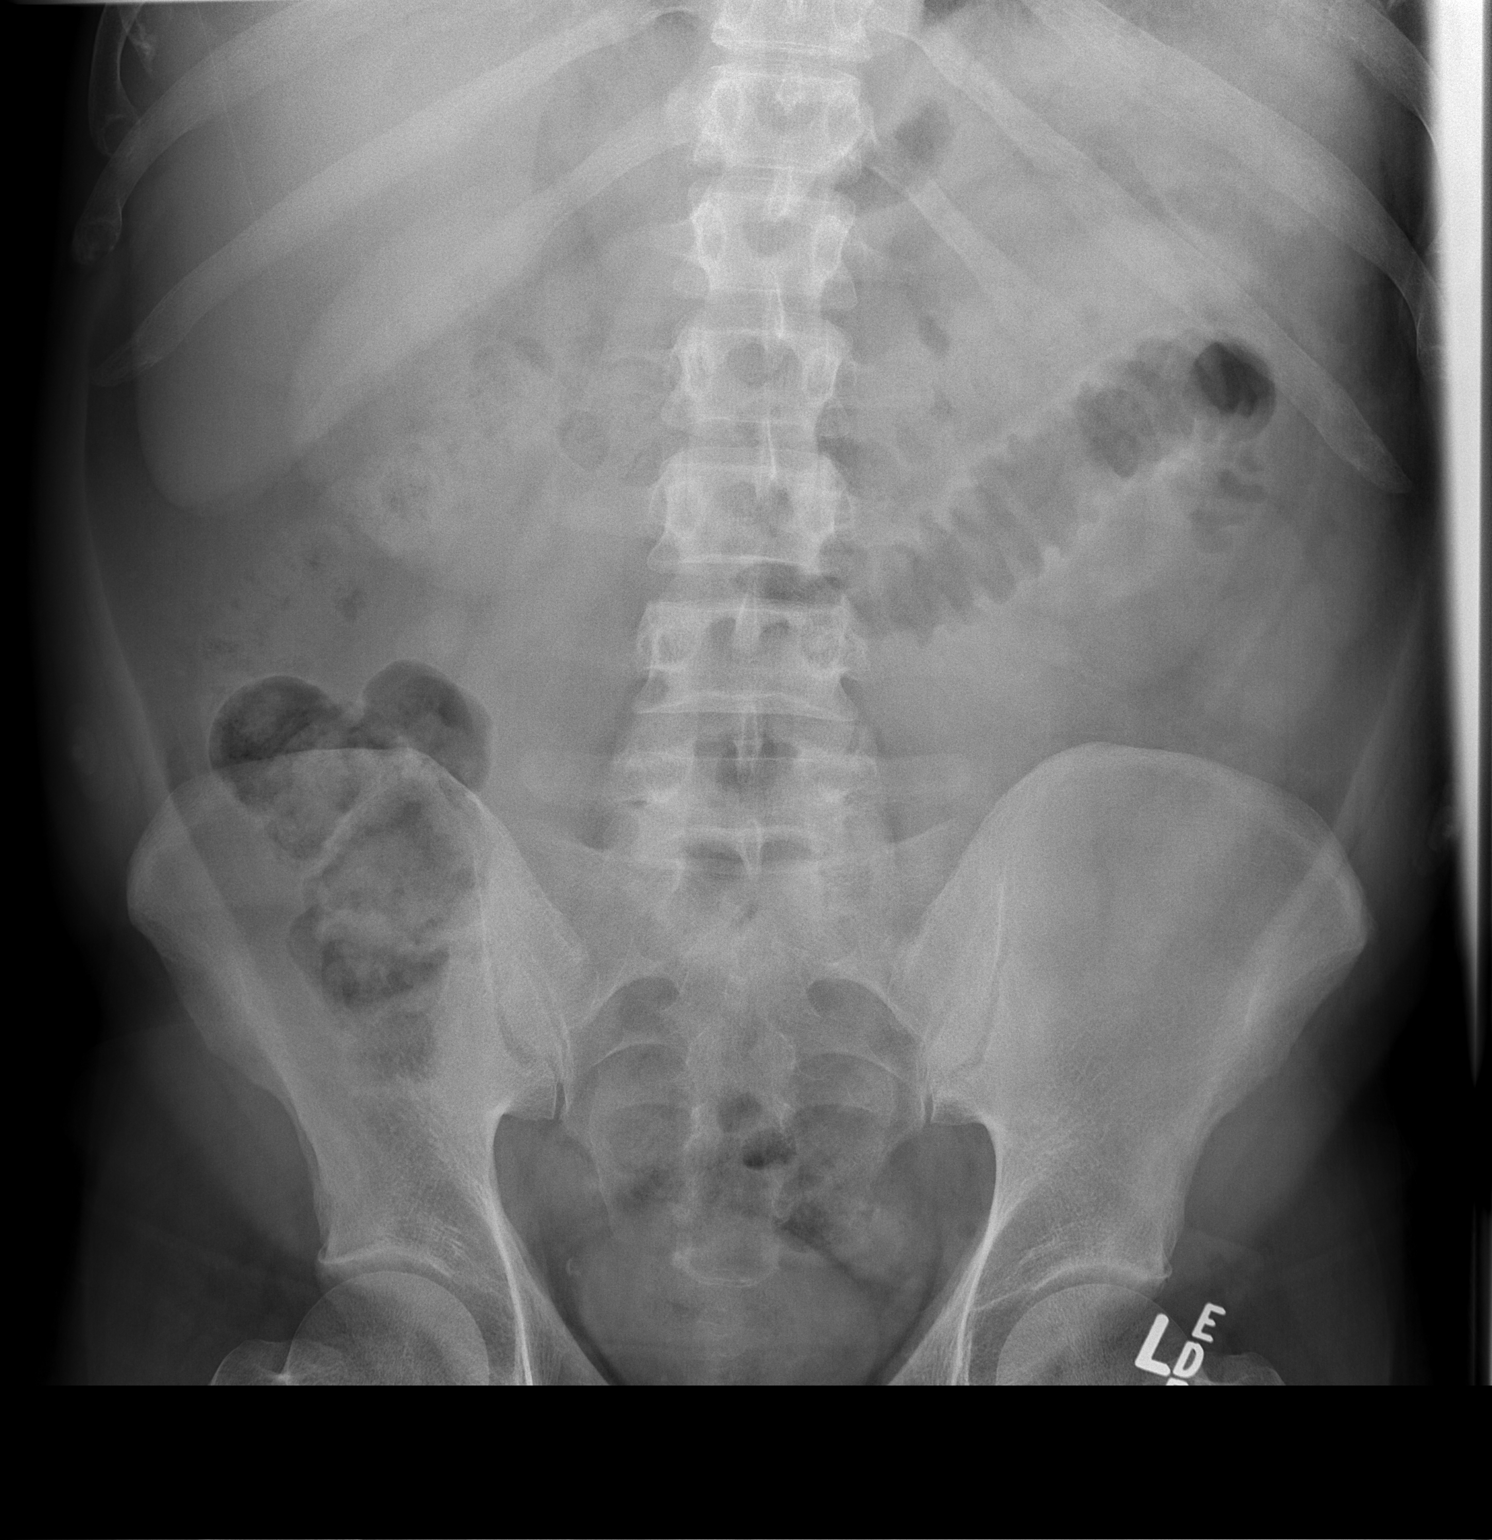

[2 of 2 positions shown; findings below may reference images not displayed]

FINDINGS: Mild prominence of a single proximal jejunal bowel loop in the left
mid abdomen is suggestive of focal ileus from pancreatitis given the
elevated lipase level. There is no evidence of small bowel
obstruction or colonic dilatation. No free air is identified. No
abnormal calcifications. Bony structures are unremarkable.
IMPRESSION: No evidence of bowel obstruction. A single mildly prominent proximal
jejunal bowel loop is suggestive of focal ileus due to pancreatitis.

## 2016-12-20 DIAGNOSIS — Z6830 Body mass index (BMI) 30.0-30.9, adult: Secondary | ICD-10-CM | POA: Diagnosis not present

## 2016-12-20 DIAGNOSIS — Z13220 Encounter for screening for lipoid disorders: Secondary | ICD-10-CM | POA: Diagnosis not present

## 2016-12-20 DIAGNOSIS — Z713 Dietary counseling and surveillance: Secondary | ICD-10-CM | POA: Diagnosis not present

## 2016-12-20 DIAGNOSIS — Z136 Encounter for screening for cardiovascular disorders: Secondary | ICD-10-CM | POA: Diagnosis not present

## 2017-05-20 DIAGNOSIS — E1165 Type 2 diabetes mellitus with hyperglycemia: Secondary | ICD-10-CM | POA: Diagnosis not present

## 2017-05-20 DIAGNOSIS — K219 Gastro-esophageal reflux disease without esophagitis: Secondary | ICD-10-CM | POA: Diagnosis not present

## 2017-05-20 DIAGNOSIS — I10 Essential (primary) hypertension: Secondary | ICD-10-CM | POA: Diagnosis not present

## 2017-05-20 DIAGNOSIS — D509 Iron deficiency anemia, unspecified: Secondary | ICD-10-CM | POA: Diagnosis not present

## 2017-09-02 DIAGNOSIS — I10 Essential (primary) hypertension: Secondary | ICD-10-CM | POA: Diagnosis not present

## 2017-09-02 DIAGNOSIS — L02429 Furuncle of limb, unspecified: Secondary | ICD-10-CM | POA: Diagnosis not present

## 2017-09-03 DIAGNOSIS — N5201 Erectile dysfunction due to arterial insufficiency: Secondary | ICD-10-CM | POA: Diagnosis not present

## 2017-10-16 ENCOUNTER — Other Ambulatory Visit: Payer: Self-pay | Admitting: Nurse Practitioner

## 2017-10-16 MED ORDER — METFORMIN HCL 500 MG PO TABS: 500.0000 mg | ORAL_TABLET | Freq: Two times a day (BID) | ORAL | 1 refills | Status: DC

## 2017-10-17 ENCOUNTER — Other Ambulatory Visit: Payer: Self-pay

## 2017-10-17 MED ORDER — METFORMIN HCL 500 MG PO TABS: 500.0000 mg | ORAL_TABLET | Freq: Two times a day (BID) | ORAL | 1 refills | Status: DC

## 2017-10-19 DIAGNOSIS — Z136 Encounter for screening for cardiovascular disorders: Secondary | ICD-10-CM | POA: Diagnosis not present

## 2017-10-19 DIAGNOSIS — Z13220 Encounter for screening for lipoid disorders: Secondary | ICD-10-CM | POA: Diagnosis not present

## 2017-10-19 DIAGNOSIS — Z713 Dietary counseling and surveillance: Secondary | ICD-10-CM | POA: Diagnosis not present

## 2017-10-19 DIAGNOSIS — Z131 Encounter for screening for diabetes mellitus: Secondary | ICD-10-CM | POA: Diagnosis not present

## 2017-11-06 ENCOUNTER — Other Ambulatory Visit: Payer: Self-pay

## 2017-11-06 MED ORDER — GLIMEPIRIDE 2 MG PO TABS: 2.0000 mg | ORAL_TABLET | Freq: Two times a day (BID) | ORAL | 3 refills | Status: DC

## 2017-11-06 MED ORDER — GLIMEPIRIDE 2 MG PO TABS: 2.0000 mg | ORAL_TABLET | Freq: Two times a day (BID) | ORAL | 1 refills | Status: DC

## 2017-11-15 DIAGNOSIS — N5201 Erectile dysfunction due to arterial insufficiency: Secondary | ICD-10-CM | POA: Diagnosis not present

## 2017-11-19 ENCOUNTER — Ambulatory Visit: Payer: Self-pay | Admitting: Nurse Practitioner

## 2017-11-20 DIAGNOSIS — N5201 Erectile dysfunction due to arterial insufficiency: Secondary | ICD-10-CM | POA: Diagnosis not present

## 2017-12-26 ENCOUNTER — Ambulatory Visit: Payer: BLUE CROSS/BLUE SHIELD | Admitting: Podiatry

## 2017-12-26 ENCOUNTER — Ambulatory Visit: Payer: BLUE CROSS/BLUE SHIELD

## 2017-12-26 ENCOUNTER — Encounter: Payer: Self-pay | Admitting: Podiatry

## 2017-12-26 ENCOUNTER — Ambulatory Visit (INDEPENDENT_AMBULATORY_CARE_PROVIDER_SITE_OTHER): Payer: BLUE CROSS/BLUE SHIELD

## 2017-12-26 VITALS — BP 152/80 | HR 89

## 2017-12-26 DIAGNOSIS — M722 Plantar fascial fibromatosis: Secondary | ICD-10-CM

## 2017-12-26 MED ORDER — MELOXICAM 15 MG PO TABS: 15.0000 mg | ORAL_TABLET | Freq: Every day | ORAL | 3 refills | Status: DC

## 2017-12-26 NOTE — Progress Notes (Signed)
   Subjective:    Patient ID: Ronnie Mejia, male    DOB: 1971-09-06, 47 y.o.   MRN: [id number]  HPI  Chief Complaint  Patient presents with  . Foot Pain    B/L bottom of heel, left worse than right x 3 months       Review of Systems  All other systems reviewed and are negative.      Objective:   Physical Exam: Vital signs are stable alert and oriented x3.  Pulses are palpable.  Neurologic sensorium is intact.  Deep tendon reflexes are intact.  Muscle strength was 5/5 dorsiflexors plantar flexors inverters everters all intrinsic musculature is intact.  Orthopedic evaluation demonstrates all joints distal to the ankle full range of motion without crepitation.  He has pain on palpation medial calcaneal tubercles bilateral left greater than right.  Radiographs taken today demonstrate an osseously mature individual with no acute findings.  Soft tissue increase in density at the plantar fascial cranial insertion site is noted bilateral.        Assessment & Plan:  Plantar fasciitis bilateral left greater than right.  Plan: Discussed etiology pathology conservative versus surgical therapies.  Start him on Mobic 15 mg 30 #1 p.o. daily with 3 refills.  I injected his bilateral heels today after sterile Betadine skin prep 20 mg Kenalog 5 mg Marcaine point maximal tenderness bilateral foot tolerated procedure well.  Dispensed plantar fascial braces and night splint.  Discussed appropriate shoe gear stretching or sizes ice therapy as your modifications will follow up with me in 1 month.

## 2017-12-26 NOTE — Patient Instructions (Signed)

## 2017-12-30 ENCOUNTER — Ambulatory Visit: Payer: BLUE CROSS/BLUE SHIELD | Admitting: Nurse Practitioner

## 2017-12-30 ENCOUNTER — Encounter: Payer: Self-pay | Admitting: Nurse Practitioner

## 2017-12-30 VITALS — BP 142/86 | HR 66 | Resp 16 | Ht 67.0 in | Wt 205.0 lb

## 2017-12-30 DIAGNOSIS — I10 Essential (primary) hypertension: Secondary | ICD-10-CM

## 2017-12-30 DIAGNOSIS — E1165 Type 2 diabetes mellitus with hyperglycemia: Secondary | ICD-10-CM

## 2017-12-30 LAB — POCT GLYCOSYLATED HEMOGLOBIN (HGB A1C): Hemoglobin A1C: 7.5

## 2017-12-30 NOTE — Progress Notes (Signed)
[facility] [address] [place], [place] [address]  Internal MEDICINE  Office Visit Note  Patient Name: Ronnie Mejia  [id number]  [id number]  Date of Service: 01/15/2018  Chief Complaint  Patient presents with  . Diabetes    Diabetes  He presents for his follow-up diabetic visit. He has type 2 diabetes mellitus. No MedicAlert identification noted. His disease course has been stable. There are no hypoglycemic associated symptoms. Pertinent negatives for hypoglycemia include no headaches, nervousness/anxiousness or tremors. Pertinent negatives for diabetes include no chest pain and no fatigue. There are no hypoglycemic complications. Symptoms are stable. There are no diabetic complications. Risk factors for coronary artery disease include diabetes mellitus. Current diabetic treatment includes oral agent (dual therapy). He is compliant with treatment most of the time. His weight is stable. He is following a low fat/cholesterol diet. Meal planning includes avoidance of concentrated sweets. He has not had a previous visit with a dietitian. He participates in exercise intermittently. There is no change in his home blood glucose trend. He sees a podiatrist.Eye exam is not current.    Pt is here for routine follow up.    Current Medication: Outpatient Encounter Medications as of 12/30/2017  Medication Sig  . glimepiride (AMARYL) 2 MG tablet Take 1 tablet (2 mg total) by mouth 2 (two) times daily.  . meloxicam (MOBIC) 15 MG tablet Take 1 tablet (15 mg total) by mouth daily.  . metFORMIN (GLUCOPHAGE) 500 MG tablet Take 1 tablet (500 mg total) by mouth 2 (two) times daily with a meal.  . omeprazole (PRILOSEC) 40 MG capsule Take 40 mg by mouth 2 (two) times daily. Midday   No facility-administered encounter medications on file as of 12/30/2017.     Surgical History: Past Surgical History:  Procedure Laterality Date  . NASAL SEPTOPLASTY W/ TURBINOPLASTY Bilateral 10/17/2015   Procedure: Nasal Septoplasty, Bilateral Partial Reduction Inferior Turbinates ;  Surgeon: Vernie Murders, MD;  Location: [facility] ORS;  Service: ENT;  Laterality: Bilateral;  . SHOULDER SURGERY Right    ARTHROSCOPIC  . WISDOM TOOTH EXTRACTION      Medical History: Past Medical History:  Diagnosis Date  . Complication of anesthesia    PT WAS SHEDULED TO HAVE IGSS AT [facility] ON 10-06-15 AND AFTER INDUCTION PT ASPIRATED SCANT AMOUNT OF GASTRIC CONTENTS AND CASE WAS CANCELLED  . Deviated septum   . Diabetes mellitus (HCC)    on metformin  . Hoarseness of voice    SINCE BEING INTUBATED ON 10-06-15  . Hypertension    PT DENIES DURING 10-14-15 PHONE INTERVIEW   . Nasal congestion    LONG TERM OBSTRUCTION/ENLARGED TURBINATES  . Pancreatitis 05/2014  . Pancreatitis   . Sleep apnea    uses  cpap    Family History: Family History  Problem Relation Age of Onset  . Diabetes Mother   . Hypertension Mother   . Diabetes Father   . Cancer Unknown        paternal aunt    Social History   Socioeconomic History  . Marital status: Married    Spouse name: Not on file  . Number of children: Not on file  . Years of education: Not on file  . Highest education level: Not on file  Social Needs  . Financial resource strain: Not on file  . Food insecurity - worry: Not on file  . Food insecurity - inability: Not on file  . Transportation needs - medical: Not on file  .  Transportation needs - non-medical: Not on file  Occupational History  . Occupation: Sports administrator: Sports coach  Tobacco Use  . Smoking status: Never Smoker  . Smokeless tobacco: Never Used  Substance and Sexual Activity  . Alcohol use: Yes    Comment: occasional, last use saturday 2-3 beers  . Drug use: No  . Sexual activity: Yes  Other Topics Concern  . Not on file  Social History Narrative  . Not on file      Review of Systems  Constitutional: Negative for activity change, chills, fatigue and  unexpected weight change.  HENT: Negative for congestion, postnasal drip, rhinorrhea, sneezing and sore throat.   Eyes: Negative.  Negative for redness.  Respiratory: Negative for cough, chest tightness and shortness of breath.   Cardiovascular: Negative for chest pain and palpitations.  Gastrointestinal: Negative for abdominal pain, constipation, diarrhea, nausea and vomiting.  Endocrine:       Improved control of blood sugars.   Genitourinary: Negative for dysuria, flank pain and frequency.  Musculoskeletal: Negative for arthralgias, back pain, joint swelling and neck pain.  Skin: Negative for rash.  Allergic/Immunologic: Negative for environmental allergies.  Neurological: Negative for tremors, numbness and headaches.  Hematological: Negative for adenopathy. Does not bruise/bleed easily.  Psychiatric/Behavioral: Negative for behavioral problems (Depression), sleep disturbance and suicidal ideas. The patient is not nervous/anxious.     Today's Vitals   12/30/17 1557  BP: (!) 142/86  Pulse: 66  Resp: 16  SpO2: 99%  Weight: 205 lb (93 kg)  Height: 5\' 7"  (1.702 m)    Physical Exam  Constitutional: He is oriented to person, place, and time. He appears well-developed and well-nourished. No distress.  HENT:  Head: Normocephalic and atraumatic.  Mouth/Throat: Oropharynx is clear and moist. No oropharyngeal exudate.  Eyes: EOM are normal. Pupils are equal, round, and reactive to light.  Neck: Normal range of motion. Neck supple. No JVD present. Carotid bruit is not present. No tracheal deviation present. No thyromegaly present.  Cardiovascular: Normal rate, regular rhythm and normal heart sounds. Exam reveals no gallop and no friction rub.  No murmur heard. Pulmonary/Chest: Effort normal and breath sounds normal. No respiratory distress. He has no wheezes. He has no rales. He exhibits no tenderness.  Abdominal: Soft. Bowel sounds are normal. There is no tenderness.  Musculoskeletal:  Normal range of motion.  Lymphadenopathy:    He has no cervical adenopathy.  Neurological: He is alert and oriented to person, place, and time. No cranial nerve deficit.  Skin: Skin is warm and dry. He is not diaphoretic.  Psychiatric: He has a normal mood and affect. His behavior is normal. Judgment and thought content normal.  Nursing note and vitals reviewed.   Assessment/Plan:  1. Uncontrolled type 2 diabetes mellitus with hyperglycemia (HCC) - POCT HgB A1C 7.5 today.  Reviewed dosing of diabetic medications and improtance of followin ADA diet. Recheck in 3 months   2. Essential hypertension bp controlled through diet and exercise. Will continue to monitor closely.   General Counseling: Raymundo verbalizes understanding of the findings of todays visit and agrees with plan of treatment. I have discussed any further diagnostic evaluation that may be needed or ordered today. We also reviewed his medications today. he has been encouraged to call the office with any questions or concerns that should arise related to todays visit.  Diabetes Counseling:  1. Addition of ACE inh/ ARB'S for nephroprotection. 2. Diabetic foot care, prevention of complications.  3.Exercise and  lose weight.  4. Diabetic eye examination, 5. Monitor blood sugar closlely. nutrition counseling.  6.Sign and symptoms of hypoglycemia including shaking sweating,confusion and headaches.  This patient was seen by Vincent GrosHeather Detra Bores, FNP- C in Collaboration with Dr Lyndon CodeFozia M Khan as a part of collaborative care agreement    Orders Placed This Encounter  Procedures  . POCT HgB A1C    Time spent: 15 Minutes   Dr Lyndon CodeFozia M Khan Internal medicine

## 2018-01-15 DIAGNOSIS — E1165 Type 2 diabetes mellitus with hyperglycemia: Secondary | ICD-10-CM | POA: Insufficient documentation

## 2018-01-15 DIAGNOSIS — I10 Essential (primary) hypertension: Secondary | ICD-10-CM | POA: Insufficient documentation

## 2018-01-23 ENCOUNTER — Encounter: Payer: Self-pay | Admitting: Podiatry

## 2018-01-23 ENCOUNTER — Ambulatory Visit (INDEPENDENT_AMBULATORY_CARE_PROVIDER_SITE_OTHER): Payer: BLUE CROSS/BLUE SHIELD | Admitting: Podiatry

## 2018-01-23 DIAGNOSIS — M722 Plantar fascial fibromatosis: Secondary | ICD-10-CM

## 2018-01-25 NOTE — Progress Notes (Signed)
He presents today for follow-up of his plantar fasciitis bilateral foot.  States that he is only taking his meloxicam whenever he needs it.  He states that my foot feels so much better nothing like it was.  States that he has not been wearing his night splint but does wear his other splint regularly.  Objective: Vital signs are stable he is alert and oriented x3.  Pulses are palpable.  He still has pain on palpation medial calcaneal tubercle.  Much decreased from previous evaluation.  Assessment: Resolving plantar fasciitis.  Plan: Recommended that he continue all of his conservative therapies including regular administration of his meloxicam.  I also recommended another injection which we performed today 20 mg of Kenalog 5 mg Marcaine point of maximal tenderness medial aspect of the right heel.  Also recommended orthotics which he was scanned for today.  Follow-up with him once those come in.

## 2018-02-07 ENCOUNTER — Other Ambulatory Visit: Payer: Self-pay

## 2018-02-07 MED ORDER — GLIMEPIRIDE 2 MG PO TABS: 2.0000 mg | ORAL_TABLET | Freq: Two times a day (BID) | ORAL | 3 refills | Status: DC

## 2018-02-13 ENCOUNTER — Other Ambulatory Visit: Payer: BLUE CROSS/BLUE SHIELD | Admitting: Orthotics

## 2018-02-13 ENCOUNTER — Other Ambulatory Visit: Payer: Self-pay | Admitting: Nurse Practitioner

## 2018-02-13 DIAGNOSIS — Z0001 Encounter for general adult medical examination with abnormal findings: Secondary | ICD-10-CM | POA: Diagnosis not present

## 2018-02-13 DIAGNOSIS — E1165 Type 2 diabetes mellitus with hyperglycemia: Secondary | ICD-10-CM | POA: Diagnosis not present

## 2018-02-13 DIAGNOSIS — N5201 Erectile dysfunction due to arterial insufficiency: Secondary | ICD-10-CM | POA: Diagnosis not present

## 2018-02-13 DIAGNOSIS — I10 Essential (primary) hypertension: Secondary | ICD-10-CM | POA: Diagnosis not present

## 2018-02-14 LAB — CBC
Hematocrit: 26.5 % — ABNORMAL LOW (ref 37.5–51.0)
Hemoglobin: 6.7 g/dL — CL (ref 13.0–17.7)
MCH: 14.8 pg — AB (ref 26.6–33.0)
MCHC: 25.3 g/dL — AB (ref 31.5–35.7)
MCV: 59 fL — ABNORMAL LOW (ref 79–97)
PLATELETS: 285 10*3/uL (ref 150–379)
RBC: 4.53 x10E6/uL (ref 4.14–5.80)
RDW: 22.2 % — ABNORMAL HIGH (ref 12.3–15.4)
WBC: 3.7 10*3/uL (ref 3.4–10.8)

## 2018-02-14 LAB — PSA, TOTAL AND FREE
PSA FREE PCT: 50 %
PSA, Free: 0.25 ng/mL
Prostate Specific Ag, Serum: 0.5 ng/mL (ref 0.0–4.0)

## 2018-02-14 LAB — COMPREHENSIVE METABOLIC PANEL
A/G RATIO: 1.2 (ref 1.2–2.2)
ALT: 25 IU/L (ref 0–44)
AST: 27 IU/L (ref 0–40)
Albumin: 4.3 g/dL (ref 3.5–5.5)
Alkaline Phosphatase: 79 IU/L (ref 39–117)
BUN/Creatinine Ratio: 17 (ref 9–20)
BUN: 13 mg/dL (ref 6–24)
Bilirubin Total: 0.4 mg/dL (ref 0.0–1.2)
CALCIUM: 9.1 mg/dL (ref 8.7–10.2)
CO2: 23 mmol/L (ref 20–29)
Chloride: 101 mmol/L (ref 96–106)
Creatinine, Ser: 0.78 mg/dL (ref 0.76–1.27)
GFR, EST AFRICAN AMERICAN: 127 mL/min/{1.73_m2} (ref >59)
GFR, EST NON AFRICAN AMERICAN: 110 mL/min/{1.73_m2} (ref >59)
GLOBULIN, TOTAL: 3.5 g/dL (ref 1.5–4.5)
Glucose: 161 mg/dL — ABNORMAL HIGH (ref 65–99)
POTASSIUM: 4.2 mmol/L (ref 3.5–5.2)
SODIUM: 138 mmol/L (ref 134–144)
TOTAL PROTEIN: 7.8 g/dL (ref 6.0–8.5)

## 2018-02-14 LAB — LIPID PANEL W/O CHOL/HDL RATIO
CHOLESTEROL TOTAL: 113 mg/dL (ref 100–199)
HDL: 31 mg/dL — ABNORMAL LOW (ref >39)
LDL CALC: 61 mg/dL (ref 0–99)
Triglycerides: 106 mg/dL (ref 0–149)
VLDL Cholesterol Cal: 21 mg/dL (ref 5–40)

## 2018-02-14 LAB — MICROALBUMIN, URINE: Microalbumin, Urine: 48.2 ug/mL

## 2018-02-14 LAB — TSH: TSH: 1.55 u[IU]/mL (ref 0.450–4.500)

## 2018-02-15 ENCOUNTER — Telehealth: Payer: Self-pay | Admitting: Nurse Practitioner

## 2018-02-16 DIAGNOSIS — K921 Melena: Secondary | ICD-10-CM | POA: Diagnosis not present

## 2018-02-16 DIAGNOSIS — E119 Type 2 diabetes mellitus without complications: Secondary | ICD-10-CM | POA: Diagnosis not present

## 2018-02-16 DIAGNOSIS — D72819 Decreased white blood cell count, unspecified: Secondary | ICD-10-CM | POA: Diagnosis not present

## 2018-02-16 DIAGNOSIS — K922 Gastrointestinal hemorrhage, unspecified: Secondary | ICD-10-CM | POA: Diagnosis not present

## 2018-02-16 DIAGNOSIS — D649 Anemia, unspecified: Secondary | ICD-10-CM | POA: Diagnosis not present

## 2018-02-16 DIAGNOSIS — G4733 Obstructive sleep apnea (adult) (pediatric): Secondary | ICD-10-CM | POA: Diagnosis not present

## 2018-02-20 DIAGNOSIS — R195 Other fecal abnormalities: Secondary | ICD-10-CM | POA: Diagnosis not present

## 2018-02-20 DIAGNOSIS — K219 Gastro-esophageal reflux disease without esophagitis: Secondary | ICD-10-CM | POA: Diagnosis not present

## 2018-02-20 DIAGNOSIS — D509 Iron deficiency anemia, unspecified: Secondary | ICD-10-CM | POA: Diagnosis not present

## 2018-02-20 DIAGNOSIS — Z1211 Encounter for screening for malignant neoplasm of colon: Secondary | ICD-10-CM | POA: Diagnosis not present

## 2018-02-24 ENCOUNTER — Other Ambulatory Visit: Payer: Self-pay | Admitting: Gastroenterology

## 2018-02-24 DIAGNOSIS — K210 Gastro-esophageal reflux disease with esophagitis: Secondary | ICD-10-CM | POA: Diagnosis not present

## 2018-02-24 DIAGNOSIS — Z1211 Encounter for screening for malignant neoplasm of colon: Secondary | ICD-10-CM | POA: Diagnosis not present

## 2018-02-24 DIAGNOSIS — D509 Iron deficiency anemia, unspecified: Secondary | ICD-10-CM | POA: Diagnosis not present

## 2018-02-24 DIAGNOSIS — K2951 Unspecified chronic gastritis with bleeding: Secondary | ICD-10-CM | POA: Diagnosis not present

## 2018-02-24 DIAGNOSIS — R195 Other fecal abnormalities: Secondary | ICD-10-CM | POA: Diagnosis not present

## 2018-02-24 DIAGNOSIS — K319 Disease of stomach and duodenum, unspecified: Secondary | ICD-10-CM | POA: Diagnosis not present

## 2018-02-26 ENCOUNTER — Encounter (HOSPITAL_COMMUNITY): Payer: Self-pay

## 2018-02-27 ENCOUNTER — Other Ambulatory Visit: Payer: Self-pay

## 2018-02-27 ENCOUNTER — Encounter (HOSPITAL_COMMUNITY): Payer: Self-pay

## 2018-02-28 ENCOUNTER — Encounter (HOSPITAL_COMMUNITY): Payer: Self-pay

## 2018-02-28 ENCOUNTER — Ambulatory Visit (HOSPITAL_COMMUNITY)
Admission: RE | Admit: 2018-02-28 | Discharge: 2018-02-28 | Disposition: A | Discharge status: Home / Self Care | Payer: BLUE CROSS/BLUE SHIELD | Source: Ambulatory Visit | Attending: Gastroenterology | Admitting: Gastroenterology

## 2018-02-28 ENCOUNTER — Ambulatory Visit (HOSPITAL_COMMUNITY): Payer: BLUE CROSS/BLUE SHIELD | Admitting: Anesthesiology

## 2018-02-28 ENCOUNTER — Encounter (HOSPITAL_COMMUNITY)
Admission: RE | Disposition: A | Discharge status: Home / Self Care | Payer: Self-pay | Source: Ambulatory Visit | Attending: Gastroenterology

## 2018-02-28 DIAGNOSIS — Z833 Family history of diabetes mellitus: Secondary | ICD-10-CM | POA: Diagnosis not present

## 2018-02-28 DIAGNOSIS — D509 Iron deficiency anemia, unspecified: Secondary | ICD-10-CM | POA: Diagnosis not present

## 2018-02-28 DIAGNOSIS — Z7984 Long term (current) use of oral hypoglycemic drugs: Secondary | ICD-10-CM | POA: Diagnosis not present

## 2018-02-28 DIAGNOSIS — I10 Essential (primary) hypertension: Secondary | ICD-10-CM | POA: Insufficient documentation

## 2018-02-28 DIAGNOSIS — E119 Type 2 diabetes mellitus without complications: Secondary | ICD-10-CM | POA: Insufficient documentation

## 2018-02-28 DIAGNOSIS — K31811 Angiodysplasia of stomach and duodenum with bleeding: Secondary | ICD-10-CM | POA: Diagnosis not present

## 2018-02-28 DIAGNOSIS — K210 Gastro-esophageal reflux disease with esophagitis: Secondary | ICD-10-CM | POA: Insufficient documentation

## 2018-02-28 DIAGNOSIS — R490 Dysphonia: Secondary | ICD-10-CM | POA: Diagnosis not present

## 2018-02-28 DIAGNOSIS — M1990 Unspecified osteoarthritis, unspecified site: Secondary | ICD-10-CM | POA: Diagnosis not present

## 2018-02-28 DIAGNOSIS — K31819 Angiodysplasia of stomach and duodenum without bleeding: Secondary | ICD-10-CM | POA: Insufficient documentation

## 2018-02-28 DIAGNOSIS — Z809 Family history of malignant neoplasm, unspecified: Secondary | ICD-10-CM | POA: Diagnosis not present

## 2018-02-28 DIAGNOSIS — K859 Acute pancreatitis without necrosis or infection, unspecified: Secondary | ICD-10-CM | POA: Diagnosis not present

## 2018-02-28 DIAGNOSIS — K449 Diaphragmatic hernia without obstruction or gangrene: Secondary | ICD-10-CM | POA: Diagnosis not present

## 2018-02-28 DIAGNOSIS — Z8249 Family history of ischemic heart disease and other diseases of the circulatory system: Secondary | ICD-10-CM | POA: Diagnosis not present

## 2018-02-28 HISTORY — DX: Nausea with vomiting, unspecified: R11.2

## 2018-02-28 HISTORY — DX: Gastro-esophageal reflux disease without esophagitis: K21.9

## 2018-02-28 HISTORY — DX: Unspecified osteoarthritis, unspecified site: M19.90

## 2018-02-28 HISTORY — PX: HOT HEMOSTASIS: SHX5433

## 2018-02-28 HISTORY — PX: ESOPHAGOGASTRODUODENOSCOPY (EGD) WITH PROPOFOL: SHX5813

## 2018-02-28 HISTORY — DX: Other specified postprocedural states: Z98.890

## 2018-02-28 LAB — GLUCOSE, CAPILLARY: Glucose-Capillary: 130 mg/dL — ABNORMAL HIGH (ref 65–99)

## 2018-02-28 SURGERY — ESOPHAGOGASTRODUODENOSCOPY (EGD) WITH PROPOFOL
Anesthesia: General

## 2018-02-28 MED ORDER — PROPOFOL 10 MG/ML IV BOLUS
INTRAVENOUS | Status: DC | PRN
  Administered 2018-02-28 (×3): 40 mg via INTRAVENOUS

## 2018-02-28 MED ORDER — PROPOFOL 10 MG/ML IV BOLUS
INTRAVENOUS | Status: AC
  Filled 2018-02-28: qty 60

## 2018-02-28 MED ORDER — LACTATED RINGERS IV SOLN
INTRAVENOUS | Status: DC
  Administered 2018-02-28: 13:00:00 via INTRAVENOUS

## 2018-02-28 MED ORDER — PROPOFOL 500 MG/50ML IV EMUL
INTRAVENOUS | Status: DC | PRN
  Administered 2018-02-28: 125 ug/kg/min via INTRAVENOUS

## 2018-02-28 MED ORDER — SODIUM CHLORIDE 0.9 % IV SOLN: INTRAVENOUS | Status: DC

## 2018-02-28 MED ORDER — ONDANSETRON HCL 4 MG/2ML IJ SOLN
INTRAMUSCULAR | Status: DC | PRN
  Administered 2018-02-28: 4 mg via INTRAVENOUS

## 2018-02-28 SURGICAL SUPPLY — 15 items

## 2018-02-28 NOTE — Op Note (Signed)
[facility] Patient Name: Ronnie Mejia Procedure Date: 02/28/2018 MRN: [id number] Attending MD: Jeani Hawking , MD Date of Birth: [birth year] CSN: [id number] Age: 47 Admit Type: Outpatient Procedure:                Upper GI endoscopy Indications:              For therapy of bleeding gastric angioectasia Providers:                Jeani Hawking, MD, [address] RN, RN, Beryle Beams, Technician, Albertina Sen[place]. Alday CRNA, CRNA Referring MD:              Medicines:                Propofol per Anesthesia Complications:            No immediate complications. Estimated Blood Loss:     Estimated blood loss was minimal. Procedure:                Pre-Anesthesia Assessment:                           - Prior to the procedure, a History and Physical                            was performed, and patient medications and                            allergies were reviewed. The patient's tolerance of                            previous anesthesia was also reviewed. The risks                            and benefits of the procedure and the sedation                            options and risks were discussed with the patient.                            All questions were answered, and informed consent                            was obtained. Prior Anticoagulants: The patient has                            taken no previous anticoagulant or antiplatelet                            agents. ASA Grade Assessment: II - A patient with                            mild systemic disease. After reviewing the risks  and benefits, the patient was deemed in                            satisfactory condition to undergo the procedure.                           - Sedation was administered by an anesthesia                            professional. Deep sedation was attained.                           After obtaining informed consent, the endoscope was                       passed under direct vision. Throughout the                            procedure, the patient's blood pressure, pulse, and                            oxygen saturations were monitored continuously. The                            EG-[id number] ([id number]) scope was introduced through the                            mouth, and advanced to the second part of duodenum.                            The upper GI endoscopy was accomplished without                            difficulty. The patient tolerated the procedure                            well. Scope In: Scope Out: Findings:      A 5 cm hiatal hernia was present.      Moderate gastric antral vascular ectasia without bleeding was present in       the gastric antrum. Coagulation for tissue destruction using monopolar       probe was successful. Estimated blood loss was minimal.      The examined duodenum was normal.      The LA Grade D esohpagitis resolved with PPI treatment. The 5 cm hiatal       hernia sac there were multiple AVMs as well as GAVE. The majority of the       lesions were successfully ablated with APC. Impression:               - 5 cm hiatal hernia.                           - Gastric antral vascular ectasia without bleeding.                            Treated  with a monopolar probe.                           - Normal examined duodenum.                           - No specimens collected. Moderate Sedation:      N/A- Per Anesthesia Care Recommendation:           - Patient has a contact number available for                            emergencies. The signs and symptoms of potential                            delayed complications were discussed with the                            patient. Return to normal activities tomorrow.                            Written discharge instructions were provided to the                            patient.                           - Resume previous diet.                            - Continue present medications.                           - Repeat upper endoscopy in 4 weeks for retreatment. Procedure Code(s):        --- Professional ---                           [id number], Esophagogastroduodenoscopy, flexible,                            transoral; with ablation of tumor(s), polyp(s), or                            other lesion(s) (includes pre- and post-dilation                            and guide wire passage, when performed) Diagnosis Code(s):        --- Professional ---                           K31.811, Angiodysplasia of stomach and duodenum                            with bleeding                           K44.9, Diaphragmatic hernia without obstruction or  gangrene CPT copyright 2017 American Medical Association. All rights reserved. The codes documented in this report are preliminary and upon coder review may  be revised to meet current compliance requirements. Jeani Hawking, MD Jeani Hawking, MD 02/28/2018 1:18:24 PM This report has been signed electronically. Number of Addenda: 0

## 2018-02-28 NOTE — Discharge Instructions (Signed)

## 2018-02-28 NOTE — H&P (Signed)
  Ronnie Mejia HPI: This 47 year old white male presents to the office for evaluation of iron deficiency anemia. He went to his PCP for a routine physical exam and labs done on 02/14/2018 revealed a hemoglobin of 6.7 gm/dl. He was advised to go to the ED. He went to [facility] ED in [place]. He was reportedly guaiac positive on exam. We do not have those labs. He as advised to start taking Ferrous Sulfate 2 per day. He has been slightly tired but denies having any syncope, near syncope or short of breath. He admits he had been taking Meloxicam for a month for plantac facitis. He was also given Pantoprazole 40 mg to take of acid reflux. He has 2-3 BM's per day. He has good appetite and his weight has been stable. He denies having any complaints of abdominal pain, nausea, vomiting, dysphagia or odynophagia. He denies having a family history of colon cancer, celiac sprue or IBD. EGD with Dr. Loreta Ave on 02/24/2018 revealed GAVE and an LA Grade D esophagitis.  Past Medical History:  Diagnosis Date  . Arthritis   . Complication of anesthesia    PT WAS SHEDULED TO HAVE IGSS AT [facility] ON 10-06-15 AND AFTER INDUCTION PT ASPIRATED SCANT AMOUNT OF GASTRIC CONTENTS AND CASE WAS CANCELLED  . Deviated septum   . Diabetes mellitus (HCC)    on metformin  . GERD (gastroesophageal reflux disease)   . Hoarseness of voice    SINCE BEING INTUBATED ON 10-06-15  . Hypertension    PT DENIES DURING 10-14-15 PHONE INTERVIEW   . Nasal congestion    LONG TERM OBSTRUCTION/ENLARGED TURBINATES  . Pancreatitis 05/2014  . Pancreatitis   . PONV (postoperative nausea and vomiting)   . Sleep apnea     had surgery no longer uses cpap    Past Surgical History:  Procedure Laterality Date  . NASAL SEPTOPLASTY W/ TURBINOPLASTY Bilateral 10/17/2015   Procedure: Nasal Septoplasty, Bilateral Partial Reduction Inferior Turbinates ;  Surgeon: Vernie Murders, MD;  Location: [facility] ORS;  Service: ENT;  Laterality: Bilateral;  .  SHOULDER SURGERY Right    ARTHROSCOPIC  . WISDOM TOOTH EXTRACTION      Family History  Problem Relation Age of Onset  . Diabetes Mother   . Hypertension Mother   . Diabetes Father   . Cancer Unknown        paternal aunt    Social History:  reports that he has never smoked. He has never used smokeless tobacco. He reports that he drinks alcohol. He reports that he does not use drugs.  Allergies: No Known Allergies  Medications:  Scheduled:  Continuous: . sodium chloride    . lactated ringers      No results found for this or any previous visit (from the past 24 hour(s)).   No results found.  ROS:  As stated above in the HPI otherwise negative.  There were no vitals taken for this visit.    PE: Gen: NAD, Alert and Oriented HEENT:  [place]/AT, EOMI Neck: Supple, no LAD Lungs: CTA Bilaterally CV: RRR without M/G/R ABM: Soft, NTND, +BS Ext: No C/C/E  Assessment/Plan: 1) GAVE. 2) IDA.  Plan: 1) EGD with APC.  Emylie Amster D 02/28/2018, 12:35 PM

## 2018-02-28 NOTE — Anesthesia Preprocedure Evaluation (Addendum)
Anesthesia Evaluation  Patient identified by MRN, date of birth, ID band Patient awake    Reviewed: Allergy & Precautions, NPO status , Patient's Chart, lab work & pertinent test results  History of Anesthesia Complications (+) PONV  Airway Mallampati: II  TM Distance: >3 FB Neck ROM: Full    Dental no notable dental hx.    Pulmonary sleep apnea ,    Pulmonary exam normal breath sounds clear to auscultation       Cardiovascular hypertension, Pt. on medications Normal cardiovascular exam Rhythm:Regular Rate:Normal     Neuro/Psych negative neurological ROS  negative psych ROS   GI/Hepatic Neg liver ROS, GERD  Medicated and Controlled,  Endo/Other  diabetes, Type 2, Oral Hypoglycemic Agents  Renal/GU negative Renal ROS  negative genitourinary   Musculoskeletal negative musculoskeletal ROS (+)   Abdominal   Peds negative pediatric ROS (+)  Hematology negative hematology ROS (+)   Anesthesia Other Findings   Reproductive/Obstetrics negative OB ROS                             Anesthesia Physical Anesthesia Plan  ASA: II  Anesthesia Plan: MAC   Post-op Pain Management:    Induction:   PONV Risk Score and Plan: 3 and Ondansetron and Treatment may vary due to age or medical condition  Airway Management Planned: Nasal Cannula  Additional Equipment:   Intra-op Plan:   Post-operative Plan:   Informed Consent: I have reviewed the patients History and Physical, chart, labs and discussed the procedure including the risks, benefits and alternatives for the proposed anesthesia with the patient or authorized representative who has indicated his/her understanding and acceptance.   Dental advisory given  Plan Discussed with: CRNA  Anesthesia Plan Comments:        Anesthesia Quick Evaluation

## 2018-02-28 NOTE — Transfer of Care (Signed)
Immediate Anesthesia Transfer of Care Note  Patient: Ronnie Mejia  Procedure(s) Performed: ESOPHAGOGASTRODUODENOSCOPY (EGD) WITH PROPOFOL (N/A ) HOT HEMOSTASIS (ARGON PLASMA COAGULATION/BICAP) (N/A )  Patient Location: PACU  Anesthesia Type:MAC  Level of Consciousness: sedated  Airway & Oxygen Therapy: Patient Spontanous Breathing and Patient connected to nasal cannula oxygen  Post-op Assessment: Report given to RN and Post -op Vital signs reviewed and stable  Post vital signs: Reviewed and stable  Last Vitals:  Vitals Value Taken Time  BP    Temp    Pulse 91 02/28/2018  1:14 PM  Resp 16 02/28/2018  1:14 PM  SpO2 100 % 02/28/2018  1:14 PM  Vitals shown include unvalidated device data.  Last Pain:  Vitals:   02/28/18 1237  TempSrc: Oral  PainSc: 0-No pain         Complications: No apparent anesthesia complications

## 2018-02-28 NOTE — Anesthesia Postprocedure Evaluation (Deleted)
Anesthesia Post Note  Patient: Ronnie Mejia  Procedure(s) Performed: ESOPHAGOGASTRODUODENOSCOPY (EGD) WITH PROPOFOL (N/A ) HOT HEMOSTASIS (ARGON PLASMA COAGULATION/BICAP) (N/A )     Patient location during evaluation: Endoscopy Anesthesia Type: General Level of consciousness: awake and alert Pain management: pain level controlled Vital Signs Assessment: post-procedure vital signs reviewed and stable Respiratory status: spontaneous breathing, nonlabored ventilation, respiratory function stable and patient connected to nasal cannula oxygen Cardiovascular status: stable and blood pressure returned to baseline Postop Assessment: no apparent nausea or vomiting Anesthetic complications: no    Last Vitals:  Vitals:   02/28/18 1315 02/28/18 1330  BP: (!) 153/67 128/83  Pulse:  77  Resp: 16 19  Temp: 37.1 C   SpO2: 100% 99%    Last Pain:  Vitals:   02/28/18 1330  TempSrc:   PainSc: 0-No pain                 Phillips Grout

## 2018-03-03 ENCOUNTER — Encounter (HOSPITAL_COMMUNITY): Payer: Self-pay | Admitting: Gastroenterology

## 2018-03-03 NOTE — Addendum Note (Signed)
Addendum  created 03/03/18 1816 by Phillips Grout, MD   Delete clinical note, Sign clinical note

## 2018-03-03 NOTE — Anesthesia Postprocedure Evaluation (Signed)
Anesthesia Post Note  Patient: Ronnie Mejia  Procedure(s) Performed: ESOPHAGOGASTRODUODENOSCOPY (EGD) WITH PROPOFOL (N/A ) HOT HEMOSTASIS (ARGON PLASMA COAGULATION/BICAP) (N/A )     Patient location during evaluation: PACU Anesthesia Type: MAC Level of consciousness: awake and alert Pain management: pain level controlled Vital Signs Assessment: post-procedure vital signs reviewed and stable Respiratory status: spontaneous breathing, nonlabored ventilation, respiratory function stable and patient connected to nasal cannula oxygen Cardiovascular status: stable and blood pressure returned to baseline Postop Assessment: no apparent nausea or vomiting Anesthetic complications: no    Last Vitals:  Vitals:   02/28/18 1315 02/28/18 1330  BP: (!) 153/67 128/83  Pulse:  77  Resp: 16 19  Temp: 37.1 C   SpO2: 100% 99%    Last Pain:  Vitals:   03/03/18 1419  TempSrc:   PainSc: 0-No pain                 Montez Hageman

## 2018-03-12 ENCOUNTER — Other Ambulatory Visit: Payer: Self-pay | Admitting: Gastroenterology

## 2018-04-17 ENCOUNTER — Encounter (HOSPITAL_COMMUNITY): Payer: Self-pay | Admitting: *Deleted

## 2018-04-18 ENCOUNTER — Encounter (HOSPITAL_COMMUNITY): Payer: Self-pay

## 2018-04-18 ENCOUNTER — Encounter (HOSPITAL_COMMUNITY)
Admission: RE | Disposition: A | Discharge status: Home / Self Care | Payer: Self-pay | Source: Ambulatory Visit | Attending: Gastroenterology

## 2018-04-18 ENCOUNTER — Other Ambulatory Visit: Payer: Self-pay

## 2018-04-18 ENCOUNTER — Ambulatory Visit (HOSPITAL_COMMUNITY): Payer: BLUE CROSS/BLUE SHIELD | Admitting: Anesthesiology

## 2018-04-18 ENCOUNTER — Ambulatory Visit (HOSPITAL_COMMUNITY)
Admission: RE | Admit: 2018-04-18 | Discharge: 2018-04-18 | Disposition: A | Discharge status: Home / Self Care | Payer: BLUE CROSS/BLUE SHIELD | Source: Ambulatory Visit | Attending: Gastroenterology | Admitting: Gastroenterology

## 2018-04-18 DIAGNOSIS — E119 Type 2 diabetes mellitus without complications: Secondary | ICD-10-CM | POA: Diagnosis not present

## 2018-04-18 DIAGNOSIS — K219 Gastro-esophageal reflux disease without esophagitis: Secondary | ICD-10-CM | POA: Insufficient documentation

## 2018-04-18 DIAGNOSIS — K859 Acute pancreatitis without necrosis or infection, unspecified: Secondary | ICD-10-CM | POA: Diagnosis not present

## 2018-04-18 DIAGNOSIS — M1990 Unspecified osteoarthritis, unspecified site: Secondary | ICD-10-CM | POA: Diagnosis not present

## 2018-04-18 DIAGNOSIS — K449 Diaphragmatic hernia without obstruction or gangrene: Secondary | ICD-10-CM | POA: Diagnosis not present

## 2018-04-18 DIAGNOSIS — K31819 Angiodysplasia of stomach and duodenum without bleeding: Secondary | ICD-10-CM | POA: Insufficient documentation

## 2018-04-18 DIAGNOSIS — Z7984 Long term (current) use of oral hypoglycemic drugs: Secondary | ICD-10-CM | POA: Diagnosis not present

## 2018-04-18 DIAGNOSIS — I10 Essential (primary) hypertension: Secondary | ICD-10-CM | POA: Diagnosis not present

## 2018-04-18 DIAGNOSIS — Q2733 Arteriovenous malformation of digestive system vessel: Secondary | ICD-10-CM | POA: Diagnosis not present

## 2018-04-18 HISTORY — PX: ESOPHAGOGASTRODUODENOSCOPY (EGD) WITH PROPOFOL: SHX5813

## 2018-04-18 HISTORY — PX: HOT HEMOSTASIS: SHX5433

## 2018-04-18 SURGERY — ESOPHAGOGASTRODUODENOSCOPY (EGD) WITH PROPOFOL
Anesthesia: Monitor Anesthesia Care

## 2018-04-18 MED ORDER — PROPOFOL 10 MG/ML IV BOLUS
INTRAVENOUS | Status: AC
  Filled 2018-04-18: qty 60

## 2018-04-18 MED ORDER — PROPOFOL 10 MG/ML IV BOLUS
INTRAVENOUS | Status: DC | PRN
  Administered 2018-04-18: 40 mg via INTRAVENOUS
  Administered 2018-04-18: 30 mg via INTRAVENOUS
  Administered 2018-04-18 (×2): 20 mg via INTRAVENOUS

## 2018-04-18 MED ORDER — SODIUM CHLORIDE 0.9 % IV SOLN: INTRAVENOUS | Status: DC

## 2018-04-18 MED ORDER — PROPOFOL 500 MG/50ML IV EMUL
INTRAVENOUS | Status: DC | PRN
  Administered 2018-04-18: 150 ug/kg/min via INTRAVENOUS

## 2018-04-18 MED ORDER — LACTATED RINGERS IV SOLN
INTRAVENOUS | Status: DC
  Administered 2018-04-18: 09:00:00 via INTRAVENOUS

## 2018-04-18 SURGICAL SUPPLY — 15 items

## 2018-04-18 NOTE — Transfer of Care (Signed)
Immediate Anesthesia Transfer of Care Note  Patient: Ronnie Mejia  Procedure(s) Performed: ESOPHAGOGASTRODUODENOSCOPY (EGD) WITH PROPOFOL (N/A ) HOT HEMOSTASIS (ARGON PLASMA COAGULATION/BICAP) (N/A )  Patient Location: PACU  Anesthesia Type:MAC  Level of Consciousness: sedated  Airway & Oxygen Therapy: Patient Spontanous Breathing and Patient connected to nasal cannula oxygen  Post-op Assessment: Report given to RN and Post -op Vital signs reviewed and stable  Post vital signs: Reviewed and stable  Last Vitals:  Vitals Value Taken Time  BP    Temp    Pulse    Resp    SpO2      Last Pain:  Vitals:   04/18/18 0918  TempSrc: Oral  PainSc: 0-No pain         Complications: No apparent anesthesia complications

## 2018-04-18 NOTE — Op Note (Signed)
[facility] Patient Name: Ronnie Mejia Procedure Date: 04/18/2018 MRN: [id number] Attending MD: Jeani Hawking , MD Date of Birth: 12/15/1970 CSN: [id number] Age: 47 Admit Type: Outpatient Procedure:                Upper GI endoscopy Indications:              Arteriovenous malformation in the stomach Providers:                Jeani Hawking, MD, Janae Sauce. Steele Berg, RN, Harrington Challenger,                            Technician Referring MD:              Medicines:                Propofol per Anesthesia Complications:            No immediate complications. Estimated Blood Loss:     Estimated blood loss: none. Procedure:                Pre-Anesthesia Assessment:                           - Prior to the procedure, a History and Physical                            was performed, and patient medications and                            allergies were reviewed. The patient's tolerance of                            previous anesthesia was also reviewed. The risks                            and benefits of the procedure and the sedation                            options and risks were discussed with the patient.                            All questions were answered, and informed consent                            was obtained. Prior Anticoagulants: The patient has                            taken no previous anticoagulant or antiplatelet                            agents. ASA Grade Assessment: II - A patient with                            mild systemic disease. After reviewing the risks  and benefits, the patient was deemed in                            satisfactory condition to undergo the procedure.                           - Sedation was administered by an anesthesia                            professional. Deep sedation was attained.                           After obtaining informed consent, the endoscope was                            passed under direct  vision. Throughout the                            procedure, the patient's blood pressure, pulse, and                            oxygen saturations were monitored continuously. The                            [id number] ([id number]) scope was introduced through the                            mouth, and advanced to the second part of duodenum.                            The upper GI endoscopy was accomplished without                            difficulty. The patient tolerated the procedure                            well. Scope In: Scope Out: Findings:      A 5 cm hiatal hernia was present.      Mild gastric antral vascular ectasia without bleeding was present in the       gastric fundus and in the gastric antrum. Coagulation for tissue       destruction using monopolar probe was successful. Estimated blood loss:       none.      The examined duodenum was normal.      The remaining GAVE in the antrum and the hiatal hernia sac were       completely ablated. Impression:               - 5 cm hiatal hernia.                           - Gastric antral vascular ectasia without bleeding.                            Treated with a monopolar probe.                           -  Normal examined duodenum.                           - No specimens collected. Moderate Sedation:      N/A- Per Anesthesia Care Recommendation:           - Patient has a contact number available for                            emergencies. The signs and symptoms of potential                            delayed complications were discussed with the                            patient. Return to normal activities tomorrow.                            Written discharge instructions were provided to the                            patient.                           - Resume previous diet.                           - Continue present medications.                           - Return to GI office in 1 year. Procedure Code(s):        ---  Professional ---                           [phone], Esophagogastroduodenoscopy, flexible,                            transoral; with ablation of tumor(s), polyp(s), or                            other lesion(s) (includes pre- and post-dilation                            and guide wire passage, when performed) Diagnosis Code(s):        --- Professional ---                           K31.819, Angiodysplasia of stomach and duodenum                            without bleeding                           K44.9, Diaphragmatic hernia without obstruction or                            gangrene  Q27.33, Arteriovenous malformation of digestive                            system vessel CPT copyright 2017 [employer]. All rights reserved. The codes documented in this report are preliminary and upon coder review may  be revised to meet current compliance requirements. Jeani Hawking, MD Jeani Hawking, MD 04/18/2018 9:58:20 AM This report has been signed electronically. Number of Addenda: 0

## 2018-04-18 NOTE — Anesthesia Preprocedure Evaluation (Signed)
Anesthesia Evaluation  Patient identified by MRN, date of birth, ID band Patient awake    Reviewed: Allergy & Precautions, NPO status , Patient's Chart, lab work & pertinent test results  History of Anesthesia Complications (+) PONV and history of anesthetic complications  Airway Mallampati: II  TM Distance: >3 FB Neck ROM: Full    Dental no notable dental hx. (+) Teeth Intact   Pulmonary sleep apnea ,    Pulmonary exam normal breath sounds clear to auscultation       Cardiovascular hypertension, Normal cardiovascular exam Rhythm:Regular Rate:Normal     Neuro/Psych negative neurological ROS  negative psych ROS   GI/Hepatic Neg liver ROS, GERD  Medicated and Controlled,  Endo/Other  diabetes, Type 2, Oral Hypoglycemic Agents  Renal/GU negative Renal ROS  negative genitourinary   Musculoskeletal negative musculoskeletal ROS (+)   Abdominal (+) + obese,   Peds negative pediatric ROS (+)  Hematology negative hematology ROS (+)   Anesthesia Other Findings   Reproductive/Obstetrics negative OB ROS                             Anesthesia Physical  Anesthesia Plan  ASA: II  Anesthesia Plan: MAC   Post-op Pain Management:    Induction:   PONV Risk Score and Plan: 3 and Ondansetron and Treatment may vary due to age or medical condition  Airway Management Planned: Nasal Cannula and Natural Airway  Additional Equipment:   Intra-op Plan:   Post-operative Plan:   Informed Consent: I have reviewed the patients History and Physical, chart, labs and discussed the procedure including the risks, benefits and alternatives for the proposed anesthesia with the patient or authorized representative who has indicated his/her understanding and acceptance.   Dental advisory given  Plan Discussed with: CRNA  Anesthesia Plan Comments:         Anesthesia Quick Evaluation

## 2018-04-18 NOTE — H&P (Signed)
  Wyatt G Taormina HPI: The patient is here for follow up treatment of his GAVE.  There were no issues s/p treatment last time.  Past Medical History:  Diagnosis Date  . Arthritis   . Complication of anesthesia    [facility] AND AFTER INDUCTION PT ASPIRATED SCANT AMOUNT OF GASTRIC CONTENTS AND CASE WAS CANCELLED  . Deviated septum   . Diabetes mellitus (HCC)    on metformin  . GERD (gastroesophageal reflux disease)   . Hoarseness of voice    SINCE BEING INTUBATED ON 10-06-15  . Hypertension    PT DENIES DURING 10-14-15 PHONE INTERVIEW   . Nasal congestion    LONG TERM OBSTRUCTION/ENLARGED TURBINATES  . Pancreatitis 05/2014  . Pancreatitis   . PONV (postoperative nausea and vomiting)   . Sleep apnea     had surgery no longer uses cpap    Past Surgical History:  Procedure Laterality Date  . ESOPHAGOGASTRODUODENOSCOPY (EGD) WITH PROPOFOL N/A 02/28/2018   Procedure: ESOPHAGOGASTRODUODENOSCOPY (EGD) WITH PROPOFOL;  Surgeon: Jeani HawkingHung, Kaylum Shrum, MD;  Location: [facility];  Service: Endoscopy;  Laterality: N/A;  . HOT HEMOSTASIS N/A 02/28/2018   Procedure: HOT HEMOSTASIS (ARGON PLASMA COAGULATION/BICAP);  Surgeon: Jeani HawkingHung, Lailanie Hasley, MD;  [facility];  Service: Endoscopy;  Laterality: N/A;  . NASAL SEPTOPLASTY W/ TURBINOPLASTY Bilateral 10/17/2015   Procedure: Nasal Septoplasty, Bilateral Partial Reduction Inferior Turbinates ;  Surgeon: Vernie MurdersPaul Juengel, MD;  Location: [facility] ORS;  Service: ENT;  Laterality: Bilateral;  . SHOULDER SURGERY Right    ARTHROSCOPIC  . WISDOM TOOTH EXTRACTION      Family History  Problem Relation Age of Onset  . Diabetes Mother   . Hypertension Mother   . Diabetes Father   . Cancer Unknown        paternal aunt    Social History:  reports that he has never smoked. He has never used smokeless tobacco. He reports that he drinks alcohol. He reports that he does not use drugs.  Allergies: No Known  Allergies  Medications:  Scheduled:  Continuous: . sodium chloride    . lactated ringers      No results found for this or any previous visit (from the past 24 hour(s)).   No results found.  ROS:  As stated above in the HPI otherwise negative.  There were no vitals taken for this visit.    PE: Gen: NAD, Alert and Oriented HEENT:  [place]/AT, EOMI Neck: Supple, no LAD Lungs: CTA Bilaterally CV: RRR without M/G/R ABM: Soft, NTND, +BS Ext: No C/C/E  Assessment/Plan: 1) GAVE - EGD with APC.  Shirla Hodgkiss D 04/18/2018, 9:19 AM

## 2018-04-18 NOTE — Discharge Instructions (Signed)

## 2018-04-21 ENCOUNTER — Encounter (HOSPITAL_COMMUNITY): Payer: Self-pay | Admitting: Gastroenterology

## 2018-04-22 NOTE — Anesthesia Postprocedure Evaluation (Signed)
Anesthesia Post Note  Patient: Ronnie Mejia  Procedure(s) Performed: ESOPHAGOGASTRODUODENOSCOPY (EGD) WITH PROPOFOL (N/A ) HOT HEMOSTASIS (ARGON PLASMA COAGULATION/BICAP) (N/A )     Patient location during evaluation: Endoscopy Anesthesia Type: MAC Level of consciousness: awake Pain management: pain level controlled Vital Signs Assessment: post-procedure vital signs reviewed and stable Respiratory status: spontaneous breathing Cardiovascular status: stable Postop Assessment: no apparent nausea or vomiting Anesthetic complications: no    Last Vitals:  Vitals:   04/18/18 1000 04/18/18 1010  BP: 136/76 113/70  Pulse: 85 84  Resp: (!) 22 14  Temp:    SpO2: 100% 99%    Last Pain:  Vitals:   04/21/18 1058  TempSrc:   PainSc: 0-No pain   Pain Goal:                 Karlye Ihrig JR,JOHN Mahealani Sulak

## 2018-05-05 ENCOUNTER — Ambulatory Visit: Payer: Self-pay | Admitting: Nurse Practitioner

## 2018-05-14 ENCOUNTER — Other Ambulatory Visit: Payer: Self-pay

## 2018-05-14 MED ORDER — GLIMEPIRIDE 2 MG PO TABS: 2.0000 mg | ORAL_TABLET | Freq: Two times a day (BID) | ORAL | 3 refills | Status: DC

## 2018-06-05 DIAGNOSIS — D509 Iron deficiency anemia, unspecified: Secondary | ICD-10-CM | POA: Diagnosis not present

## 2018-06-18 ENCOUNTER — Telehealth: Payer: Self-pay

## 2018-06-19 NOTE — Telephone Encounter (Signed)
Ok. When I get records to look at we ca refer. I have endoscopy report. No colonoscopy or updated lab work.

## 2018-06-27 ENCOUNTER — Other Ambulatory Visit: Payer: Self-pay

## 2018-06-27 ENCOUNTER — Inpatient Hospital Stay: Payer: BLUE CROSS/BLUE SHIELD

## 2018-06-27 ENCOUNTER — Encounter: Payer: Self-pay | Admitting: Oncology

## 2018-06-27 ENCOUNTER — Inpatient Hospital Stay: Payer: BLUE CROSS/BLUE SHIELD | Attending: Oncology | Admitting: Oncology

## 2018-06-27 VITALS — BP 119/79 | HR 97 | Temp 97.9°F | Resp 18 | Ht 67.0 in | Wt 204.8 lb

## 2018-06-27 DIAGNOSIS — E1165 Type 2 diabetes mellitus with hyperglycemia: Secondary | ICD-10-CM | POA: Diagnosis not present

## 2018-06-27 DIAGNOSIS — I10 Essential (primary) hypertension: Secondary | ICD-10-CM | POA: Diagnosis not present

## 2018-06-27 DIAGNOSIS — K31819 Angiodysplasia of stomach and duodenum without bleeding: Secondary | ICD-10-CM | POA: Diagnosis not present

## 2018-06-27 DIAGNOSIS — R5383 Other fatigue: Secondary | ICD-10-CM

## 2018-06-27 DIAGNOSIS — D509 Iron deficiency anemia, unspecified: Secondary | ICD-10-CM | POA: Diagnosis not present

## 2018-06-27 DIAGNOSIS — N5201 Erectile dysfunction due to arterial insufficiency: Secondary | ICD-10-CM | POA: Diagnosis not present

## 2018-06-27 LAB — CBC WITH DIFFERENTIAL/PLATELET
BASOS ABS: 0.1 10*3/uL (ref 0–0.1)
BASOS PCT: 2 %
Eosinophils Absolute: 0.1 10*3/uL (ref 0–0.7)
Eosinophils Relative: 2 %
HCT: 28.7 % — ABNORMAL LOW (ref 39.0–52.0)
HEMOGLOBIN: 8.1 g/dL — AB (ref 13.0–17.0)
Lymphocytes Relative: 41 %
Lymphs Abs: 1.5 10*3/uL (ref 1.0–3.6)
MCH: 16.3 pg — ABNORMAL LOW (ref 26.0–34.0)
MCHC: 28.1 g/dL — ABNORMAL LOW (ref 32.0–36.0)
MCV: 57.9 fL — ABNORMAL LOW (ref 80.0–100.0)
MONOS PCT: 5 %
Monocytes Absolute: 0.2 10*3/uL (ref 0.2–1.0)
NEUTROS ABS: 1.8 10*3/uL (ref 1.4–6.5)
NEUTROS PCT: 50 %
Platelets: 274 10*3/uL (ref 150–440)
RBC: 4.96 MIL/uL (ref 4.40–5.90)
RDW: 23.4 % — ABNORMAL HIGH (ref 11.5–14.5)
WBC: 3.6 10*3/uL — AB (ref 3.8–10.6)

## 2018-06-27 LAB — COMPREHENSIVE METABOLIC PANEL
ALK PHOS: 73 U/L (ref 38–126)
ALT: 29 U/L (ref 0–44)
ANION GAP: 8 (ref 5–15)
AST: 32 U/L (ref 15–41)
Albumin: 4.2 g/dL (ref 3.5–5.0)
BUN: 13 mg/dL (ref 6–20)
CALCIUM: 9.1 mg/dL (ref 8.9–10.3)
CO2: 25 mmol/L (ref 22–32)
CREATININE: 0.68 mg/dL (ref 0.61–1.24)
Chloride: 103 mmol/L (ref 98–111)
Glucose, Bld: 164 mg/dL — ABNORMAL HIGH (ref 70–99)
Potassium: 3.8 mmol/L (ref 3.5–5.1)
SODIUM: 136 mmol/L (ref 135–145)
TOTAL PROTEIN: 8.6 g/dL — AB (ref 6.5–8.1)
Total Bilirubin: 0.8 mg/dL (ref 0.3–1.2)

## 2018-06-27 LAB — IRON AND TIBC
IRON: 20 ug/dL — AB (ref 45–182)
SATURATION RATIOS: 4 % — AB (ref 17.9–39.5)
TIBC: 575 ug/dL — ABNORMAL HIGH (ref 250–450)
UIBC: 555 ug/dL

## 2018-06-27 LAB — RETICULOCYTES
RBC.: 5 MIL/uL (ref 4.40–5.90)
Retic Count, Absolute: 115 10*3/uL (ref 19.0–183.0)
Retic Ct Pct: 2.3 % (ref 0.4–3.1)

## 2018-06-27 LAB — TSH: TSH: 1.409 u[IU]/mL (ref 0.350–4.500)

## 2018-06-27 LAB — FERRITIN: FERRITIN: 8 ng/mL — AB (ref 24–336)

## 2018-06-27 LAB — VITAMIN B12: Vitamin B-12: 540 pg/mL (ref 180–914)

## 2018-06-27 LAB — FOLATE: Folate: 8.2 ng/mL (ref >5.9)

## 2018-06-27 NOTE — Progress Notes (Signed)
Hematology/Oncology Consult note [facility] Telephone:([phone] [phone]  Patient Care Team: System, Pcp Not In as PCP - General   Name of the patient: Ronnie Mejia  [id number]  15-Jul-1971    Reason for referral- anemia   Referring physician- Dr. Hermenia Fiscal  Date of visit: 06/27/18   History of presenting illness-patient is a 47 year old male who has been referred to [place] for evaluation and management of anemia.  Recent CBC from 02/16/2018 showed a white count of 3.5, H&H of 6.4/26.8 with an MCV of 63 and a platelet count of 253.  He had an EGD in June 2019 which showed a 5 cm hiatal hernia and gastric antral vascular ectasia without bleeding.  Subsequently patient has had 2 EGDs with GI and was reported to have some bleeding from his stomach which was cauterized.  He also underwent a colonoscopy which did not reveal any evidence of bleeding.  He was taking oral iron in the past but has not taken any iron since the last 3 months.  Reports no dark melanotic stools or bright red blood in his stool.  He occasionally takes Aleve for headache once a month.  He reports problems with sexual function.  Does not report any significant fatigue  ECOG PS- 0  Pain scale- 0   Review of systems- Review of Systems  Constitutional: Negative for chills, fever, malaise/fatigue and weight loss.  HENT: Negative for congestion, ear discharge and nosebleeds.   Eyes: Negative for blurred vision.  Respiratory: Negative for cough, hemoptysis, sputum production, shortness of breath and wheezing.   Cardiovascular: Negative for chest pain, palpitations, orthopnea and claudication.  Gastrointestinal: Negative for abdominal pain, blood in stool, constipation, diarrhea, heartburn, melena, nausea and vomiting.  Genitourinary: Negative for dysuria, flank pain, frequency, hematuria and urgency.       Problems with sexual function  Musculoskeletal: Negative for back pain, joint pain and  myalgias.  Skin: Negative for rash.  Neurological: Negative for dizziness, tingling, focal weakness, seizures, weakness and headaches.  Endo/Heme/Allergies: Does not bruise/bleed easily.  Psychiatric/Behavioral: Negative for depression and suicidal ideas. The patient does not have insomnia.     No Known Allergies  Patient Active Problem List   Diagnosis Date Noted  . Hypertension 01/15/2018  . Uncontrolled type 2 diabetes mellitus with hyperglycemia (HCC) 01/15/2018  . Acute pancreatitis 06/16/2014  . DM2 (diabetes mellitus, type 2) (HCC) 06/16/2014     Past Medical History:  Diagnosis Date  . Arthritis   . Complication of anesthesia    PT WAS SHEDULED TO HAVE IGSS AT [facility] ON 10-06-15 AND AFTER INDUCTION PT ASPIRATED SCANT AMOUNT OF GASTRIC CONTENTS AND CASE WAS CANCELLED  . Deviated septum   . Diabetes mellitus (HCC)    on metformin  . GERD (gastroesophageal reflux disease)   . Hoarseness of voice    SINCE BEING INTUBATED ON 10-06-15  . Hypertension    PT DENIES DURING 10-14-15 PHONE INTERVIEW   . Nasal congestion    LONG TERM OBSTRUCTION/ENLARGED TURBINATES  . Pancreatitis 05/2014  . Pancreatitis   . PONV (postoperative nausea and vomiting)   . Sleep apnea     had surgery no longer uses cpap     Past Surgical History:  Procedure Laterality Date  . ESOPHAGOGASTRODUODENOSCOPY (EGD) WITH PROPOFOL N/A 02/28/2018   Procedure: ESOPHAGOGASTRODUODENOSCOPY (EGD) WITH PROPOFOL;  Surgeon: Jeani Hawking, MD;  Location: [facility];  Service: Endoscopy;  Laterality: N/A;  . ESOPHAGOGASTRODUODENOSCOPY (EGD) WITH PROPOFOL N/A 04/18/2018  Procedure: ESOPHAGOGASTRODUODENOSCOPY (EGD) WITH PROPOFOL;  Surgeon: Jeani HawkingHung, Patrick, MD;  Location: [facility];  Service: Endoscopy;  Laterality: N/A;  . HOT HEMOSTASIS N/A 02/28/2018   Procedure: HOT HEMOSTASIS (ARGON PLASMA COAGULATION/BICAP);  Surgeon: Jeani HawkingHung, Patrick, MD;  [facility];  Service: Endoscopy;  Laterality: N/A;   . HOT HEMOSTASIS N/A 04/18/2018   Procedure: HOT HEMOSTASIS (ARGON PLASMA COAGULATION/BICAP);  Surgeon: Jeani HawkingHung, Patrick, MD;  [facility];  Service: Endoscopy;  Laterality: N/A;  . NASAL SEPTOPLASTY W/ TURBINOPLASTY Bilateral 10/17/2015   Procedure: Nasal Septoplasty, Bilateral Partial Reduction Inferior Turbinates ;  Surgeon: Vernie MurdersPaul Juengel, MD;  Location: [facility];  Service: ENT;  Laterality: Bilateral;  . SHOULDER SURGERY Right    ARTHROSCOPIC  . WISDOM TOOTH EXTRACTION      Social History   Socioeconomic History  . Marital status: Married    Spouse name: Not on file  . Number of children: Not on file  . Years of education: Not on file  . Highest education level: Not on file  Occupational History  . Occupation: Sports administratormechanic    Employer: [employer]  Social Needs  . Financial resource strain: Not on file  . Food insecurity:    Worry: Not on file    Inability: Not on file  . Transportation needs:    Medical: Not on file    Non-medical: Not on file  Tobacco Use  . Smoking status: Never Smoker  . Smokeless tobacco: Never Used  Substance and Sexual Activity  . Alcohol use: Yes    Comment: occasional, last use saturday 2-3 beers  . Drug use: No  . Sexual activity: Yes  Lifestyle  . Physical activity:    Days per week: Not on file    Minutes per session: Not on file  . Stress: Not on file  Relationships  . Social connections:    Talks on phone: Not on file    Gets together: Not on file    Attends religious service: Not on file    Active member of club or organization: Not on file    Attends meetings of clubs or organizations: Not on file    Relationship status: Not on file  . Intimate partner violence:    Fear of current or ex partner: Not on file    Emotionally abused: Not on file    Physically abused: Not on file    Forced sexual activity: Not on file  Other Topics Concern  . Not on file  Social History Narrative  . Not on file     Family History  Problem  Relation Age of Onset  . Diabetes Mother   . Hypertension Mother   . Diabetes Father   . Cancer Unknown        paternal aunt     Current Outpatient Medications:  .  dexlansoprazole (DEXILANT) 60 MG capsule, Take 60 mg by mouth daily., Disp: , Rfl:  .  glimepiride (AMARYL) 2 MG tablet, Take 1 tablet (2 mg total) by mouth 2 (two) times daily., Disp: 60 tablet, Rfl: 3 .  metFORMIN (GLUCOPHAGE) 500 MG tablet, Take 500 mg by mouth 2 (two) times daily., Disp: , Rfl:    Physical exam:  Vitals:   06/27/18 1330  BP: 119/79  Pulse: 97  Resp: 18  Temp: 97.9 F (36.6 C)  TempSrc: Tympanic  SpO2: 97%  Weight: 204 lb 12.8 oz (92.9 kg)  Height: 5\' 7"  (1.702 m)   Physical Exam  Constitutional: He is oriented to person,  place, and time. He appears well-developed and well-nourished.  HENT:  Head: Normocephalic and atraumatic.  Eyes: Pupils are equal, round, and reactive to light. EOM are normal.  Neck: Normal range of motion.  Cardiovascular: Normal rate, regular rhythm and normal heart sounds.  Pulmonary/Chest: Effort normal and breath sounds normal.  Abdominal: Soft. Bowel sounds are normal. He exhibits no distension. There is no tenderness.  Neurological: He is alert and oriented to person, place, and time.  Skin: Skin is warm and dry.       CMP Latest Ref Rng & Units 02/13/2018  Glucose 65 - 99 mg/dL 604(V)  BUN 6 - 24 mg/dL 13  Creatinine 4.09 - 8.11 mg/dL 9.14  Sodium 782 - 956 mmol/L 138  Potassium 3.5 - 5.2 mmol/L 4.2  Chloride 96 - 106 mmol/L 101  CO2 20 - 29 mmol/L 23  Calcium 8.7 - 10.2 mg/dL 9.1  Total Protein 6.0 - 8.5 g/dL 7.8  Total Bilirubin 0.0 - 1.2 mg/dL 0.4  Alkaline Phos 39 - 117 IU/L 79  AST 0 - 40 IU/L 27  ALT 0 - 44 IU/L 25   CBC Latest Ref Rng & Units 02/13/2018  WBC 3.4 - 10.8 x10E3/uL 3.7  Hemoglobin 13.0 - 17.7 g/dL 6.7(LL)  Hematocrit 37.5 - 51.0 % 26.5(L)  Platelets 150 - 379 x10E3/uL 285     Assessment and plan- Patient is a 47 y.o. male  referred for microcytic anemia  Microcytic anemia likely secondary to iron deficiency.  Today I will check a CBC with differential, CMP, ferritin and iron studies, B12 and folate, reticulocyte count, haptoglobin, TSH.  If there is evidence of significant iron deficiency I will plan to schedule him for 2 doses of Feraheme 510 mg IV weekly.  I will see him back in 2 weeks time to discuss the results of his blood work.  Etiology of iron deficiency anemia likely secondary to bleeding from gastric antral vascular ectasia.  We will obtain outside GI records for our review as well   Thank you for this kind referral and the opportunity to participate in the care of this patient   Visit Diagnosis 1. Microcytic anemia     Dr. Owens Shark, MD, MPH [facility] at [facility] [phone] 06/27/2018  2:28 PM

## 2018-06-28 LAB — HAPTOGLOBIN: HAPTOGLOBIN: 64 mg/dL (ref 34–200)

## 2018-07-07 ENCOUNTER — Inpatient Hospital Stay: Payer: BLUE CROSS/BLUE SHIELD | Attending: Oncology

## 2018-07-07 VITALS — BP 135/73 | HR 91 | Temp 97.0°F | Resp 18

## 2018-07-07 DIAGNOSIS — K31819 Angiodysplasia of stomach and duodenum without bleeding: Secondary | ICD-10-CM | POA: Diagnosis not present

## 2018-07-07 DIAGNOSIS — D500 Iron deficiency anemia secondary to blood loss (chronic): Secondary | ICD-10-CM | POA: Diagnosis not present

## 2018-07-07 DIAGNOSIS — D509 Iron deficiency anemia, unspecified: Secondary | ICD-10-CM

## 2018-07-07 MED ORDER — SODIUM CHLORIDE 0.9 % IV SOLN
510.0000 mg | Freq: Once | INTRAVENOUS | Status: AC
  Administered 2018-07-07: 510 mg via INTRAVENOUS
  Filled 2018-07-07: qty 17

## 2018-07-07 MED ORDER — SODIUM CHLORIDE 0.9 % IV SOLN
Freq: Once | INTRAVENOUS | Status: AC
  Administered 2018-07-07: 15:00:00 via INTRAVENOUS
  Filled 2018-07-07: qty 250

## 2018-07-15 ENCOUNTER — Inpatient Hospital Stay: Payer: BLUE CROSS/BLUE SHIELD

## 2018-07-15 ENCOUNTER — Inpatient Hospital Stay (HOSPITAL_BASED_OUTPATIENT_CLINIC_OR_DEPARTMENT_OTHER): Payer: BLUE CROSS/BLUE SHIELD | Admitting: Oncology

## 2018-07-15 ENCOUNTER — Encounter: Payer: Self-pay | Admitting: Oncology

## 2018-07-15 VITALS — BP 123/77 | HR 79 | Temp 98.2°F | Resp 18 | Ht 67.0 in | Wt 209.0 lb

## 2018-07-15 DIAGNOSIS — D509 Iron deficiency anemia, unspecified: Secondary | ICD-10-CM

## 2018-07-15 DIAGNOSIS — K31819 Angiodysplasia of stomach and duodenum without bleeding: Secondary | ICD-10-CM

## 2018-07-15 DIAGNOSIS — D500 Iron deficiency anemia secondary to blood loss (chronic): Secondary | ICD-10-CM

## 2018-07-15 MED ORDER — SODIUM CHLORIDE 0.9 % IV SOLN
Freq: Once | INTRAVENOUS | Status: AC
  Administered 2018-07-15: 15:00:00 via INTRAVENOUS
  Filled 2018-07-15: qty 250

## 2018-07-15 MED ORDER — SODIUM CHLORIDE 0.9 % IV SOLN
510.0000 mg | Freq: Once | INTRAVENOUS | Status: AC
  Administered 2018-07-15: 510 mg via INTRAVENOUS
  Filled 2018-07-15: qty 17

## 2018-07-15 NOTE — Progress Notes (Signed)
Pt has fatigue from raising money at charity event this weekend

## 2018-07-17 NOTE — Progress Notes (Signed)
Hematology/Oncology Consult note [facility]  Telephone:([phone]) [phone] Fax:([phone]  [facility] Pcp Not In as PCP - General   Name of the patient: Ronnie StallingRonnie Mejia  [id number]  November 02, 1970   Date of visit: 07/17/18  Diagnosis- iron deficiency anemia likely due to gastric vascular ectasia  Chief complaint/ Reason for visit- discuss results of bloodwork  Heme/Onc history: patient is a 47 year old male who has been referred to us for evaluation and management of anemia.  Recent CBC from 02/16/2018 showed a white count of 3.5, H&H of 6.4/26.8 with an MCV of 63 and a platelet count of 253.  He had an EGD in June 2019 which showed a 5 cm hiatal hernia and gastric antral vascular ectasia without bleeding.  Subsequently patient has had 2 EGDs with GI and was reported to have some bleeding from his stomach which was cauterized.  He also underwent a colonoscopy which did not reveal any evidence of bleeding.  He was taking oral iron in the past but has not taken any iron since the last 3 months.  Reports no dark melanotic stools or bright red blood in his stool.  He occasionally takes Aleve for headache once a month.  He reports problems with sexual function.  Does not report any significant fatigue  Results of blood work from 06/27/2018 were as follows: CBC showed white count of 3.6, H&H of 8.1/28.7 with an MCV of 57.9 and a platelet count of 274.  CMP was within normal limits.  B12 and folate were normal.  Ferritin levels were low at 8.  Iron study showed a low iron saturation of 4%.  TIBC was elevated at 575.  Reticulocyte count was inappropriately low for the degree of anemia.  Haptoglobin was normal at 64 and TSH was normal.  Interval history-patient has not noticed any fresh blood in his stool or dark melanotic stools.  He has received 1 dose of Feraheme so far without any significant side effects.  ECOG PS- 1 Pain scale- 0 Opioid associated constipation-  no  Review of systems- Review of Systems  Constitutional: Negative for chills, fever, malaise/fatigue and weight loss.  HENT: Negative for congestion, ear discharge and nosebleeds.   Eyes: Negative for blurred vision.  Respiratory: Negative for cough, hemoptysis, sputum production, shortness of breath and wheezing.   Cardiovascular: Negative for chest pain, palpitations, orthopnea and claudication.  Gastrointestinal: Negative for abdominal pain, blood in stool, constipation, diarrhea, heartburn, melena, nausea and vomiting.  Genitourinary: Negative for dysuria, flank pain, frequency, hematuria and urgency.  Musculoskeletal: Negative for back pain, joint pain and myalgias.  Skin: Negative for rash.  Neurological: Negative for dizziness, tingling, focal weakness, seizures, weakness and headaches.  Endo/Heme/Allergies: Does not bruise/bleed easily.  Psychiatric/Behavioral: Negative for depression and suicidal ideas. The patient does not have insomnia.        No Known Allergies   Past Medical History:  Diagnosis Date  . Anemia   . Arthritis   . Complication of anesthesia    PT WAS [facility]EDUL[facility] TO HAVE [facility]SS [facility] ON 10-06-15 AND AFTER INDUCTION PT ASPIRATED SCANT AMOUNT OF GASTRIC CONTENTS AND CASE WAS CANCELLED  . Deviated septum   . Diabetes mellitus (HCC)    on metformin  . GERD (gastroesophageal reflux disease)   . Hoarseness of voice    SINCE BEING INTUBATED ON 10-06-15  . Hypertension    PT DENIES DURING 10-14-15 PHONE INTERVIEW   . Nasal congestion  LONG TERM OBSTRUCTION/ENLARGED TURBINATES  . Pancreatitis 05/2014  . Pancreatitis   . PONV (postoperative nausea and vomiting)   . Sleep apnea     had surgery no longer uses cpap     Past Surgical History:  Procedure Laterality Date  . ESOPHAGOGASTRODUODENOSCOPY (EGD) WITH PROPOFOL N/A 02/28/2018   Procedure: ESOPHAGOGASTRODUODENOSCOPY (EGD) WITH PROPOFOL;  Surgeon: Jeani Hawking, MD;  [facility];   Service: Endoscopy;  Laterality: N/A;  . ESOPHAGOGASTRODUODENOSCOPY (EGD) WITH PROPOFOL N/A 04/18/2018   Procedure: ESOPHAGOGASTRODUODENOSCOPY (EGD) WITH PROPOFOL;  Surgeon: Jeani Hawking, MD;  [facility];  Service: Endoscopy;  Laterality: N/A;  . HOT HEMOSTASIS N/A 02/28/2018   Procedure: HOT HEMOSTASIS (ARGON PLASMA COAGULATION/BICAP);  Surgeon: Jeani Hawking, MD;  [facility];  Service: Endoscopy;  Laterality: N/A;  . HOT HEMOSTASIS N/A 04/18/2018   Procedure: HOT HEMOSTASIS (ARGON PLASMA COAGULATION/BICAP);  Surgeon: Jeani Hawking, MD;  [facility];  Service: Endoscopy;  Laterality: N/A;  . NASAL SEPTOPLASTY W/ TURBINOPLASTY Bilateral 10/17/2015   Procedure: Nasal Septoplasty, Bilateral Partial Reduction Inferior Turbinates ;  Surgeon: Vernie Murders, MD;  Location: [facility];  Service: ENT;  Laterality: Bilateral;  . SHOULDER SURGERY Right    ARTHROSCOPIC  . WISDOM TOOTH EXTRACTION      Social History   Socioeconomic History  . Marital status: Married    Spouse name: Not on file  . Number of children: Not on file  . Years of education: Not on file  . Highest education level: Not on file  Occupational History  . Occupation: Sports administrator: Sports coach  Social Needs  . Financial resource strain: Not on file  . Food insecurity:    Worry: Not on file    Inability: Not on file  . Transportation needs:    Medical: Not on file    Non-medical: Not on file  Tobacco Use  . Smoking status: Never Smoker  . Smokeless tobacco: Never Used  Substance and Sexual Activity  . Alcohol use: Yes    Comment: occasional, last use saturday 2-3 beers  . Drug use: No  . Sexual activity: Yes  Lifestyle  . Physical activity:    Days per week: Not on file    Minutes per session: Not on file  . Stress: Not on file  Relationships  . Social connections:    Talks on phone: Not on file    Gets together: Not on file    Attends religious service: Not on file     Active member of club or organization: Not on file    Attends meetings of clubs or organizations: Not on file    Relationship status: Not on file  . Intimate partner violence:    Fear of current or ex partner: Not on file    Emotionally abused: Not on file    Physically abused: Not on file    Forced sexual activity: Not on file  Other Topics Concern  . Not on file  Social History Narrative  . Not on file    Family History  Problem Relation Age of Onset  . Diabetes Mother   . Hypertension Mother   . Diabetes Father   . Cancer Unknown        paternal aunt     Current Outpatient Medications:  .  dexlansoprazole (DEXILANT) 60 MG capsule, Take 60 mg by mouth daily., Disp: , Rfl:  .  glimepiride (AMARYL) 2 MG tablet, Take 1 tablet (2 mg total) by mouth 2 (  two) times daily., Disp: 60 tablet, Rfl: 3 .  metFORMIN (GLUCOPHAGE) 500 MG tablet, Take 500 mg by mouth 2 (two) times daily., Disp: , Rfl:   Physical exam:  Vitals:   07/15/18 1429  BP: 123/77  Pulse: 79  Resp: 18  Temp: 98.2 F (36.8 C)  TempSrc: Tympanic  Weight: 209 lb (94.8 kg)  Height: 5\' 7"  (1.702 m)   Physical Exam  Constitutional: He is oriented to person, place, and time. He appears well-developed and well-nourished.  HENT:  Head: Normocephalic and atraumatic.  Eyes: Pupils are equal, round, and reactive to light. EOM are normal.  Neck: Normal range of motion.  Cardiovascular: Normal rate, regular rhythm and normal heart sounds.  Pulmonary/Chest: Effort normal and breath sounds normal.  Abdominal: Soft. Bowel sounds are normal.  Neurological: He is alert and oriented to person, place, and time.  Skin: Skin is warm and dry.     CMP Latest Ref Rng & Units 06/27/2018  Glucose 70 - 99 mg/dL 161(W)  BUN 6 - 20 mg/dL 13  Creatinine 9.60 - 4.54 mg/dL 0.98  Sodium 119 - 147 mmol/L 136  Potassium 3.5 - 5.1 mmol/L 3.8  Chloride 98 - 111 mmol/L 103  CO2 22 - 32 mmol/L 25  Calcium 8.9 - 10.3 mg/dL 9.1  Total  Protein 6.5 - 8.1 g/dL 8.2(N)  Total Bilirubin 0.3 - 1.2 mg/dL 0.8  Alkaline Phos 38 - 126 U/L 73  AST 15 - 41 U/L 32  ALT 0 - 44 U/L 29   CBC Latest Ref Rng & Units 06/27/2018  WBC 3.8 - 10.6 K/uL 3.6(L)  Hemoglobin 13.0 - 17.0 g/dL 8.1(L)  Hematocrit 39.0 - 52.0 % 28.7(L)  Platelets 150 - 440 K/uL 274      Assessment and plan- Patient is a 47 y.o. male referred for anemia secondary to iron deficiency secondary to gastric antral vascular ectasia  Discussed the findings of anemia work-up with the patient in detail.  He does have significant microcytic anemia secondary to iron deficiency.  I suspect this is secondary to the gastric ectasia which was noted on EGD.  He did have subsequent argon photocoagulation done by GI and hopefully it has stabilized by now.  He has already received 1 dose of IV iron and will proceed with second dose of IV iron today.  I will see him back in 6 to 8 weeks time with repeat CBC ferritin and iron studies.  If his iron studies continue to be low he may require more doses of Feraheme.  If his anemia fails to improve despite IV iron and if he continues to have bleeding issues we will have to consider alternative treatments down the line such as octreotide.   Visit Diagnosis 1. Microcytic anemia   2. Iron deficiency anemia due to chronic blood loss      Dr. Owens Shark, MD, MPH [facility] [phone] 07/17/2018 8:38 AM

## 2018-08-05 NOTE — Telephone Encounter (Signed)
Called and spoke with patient and his wife, advising them of critically low Hgb and Hct. Patient states experiencing no symptoms. No excess fatigue or dizziness. Has not noted blood in urine or stool. No hemoptysis. Advised the patient and his wife that patient should be seen and treated in ER due to critically low values. Both he and his wife understood the significance and importance of evaluation and treatment in ER.

## 2018-08-06 ENCOUNTER — Other Ambulatory Visit: Payer: Self-pay | Admitting: Nurse Practitioner

## 2018-08-06 MED ORDER — GLIMEPIRIDE 2 MG PO TABS: 2.0000 mg | ORAL_TABLET | Freq: Two times a day (BID) | ORAL | 3 refills | Status: DC

## 2018-09-16 ENCOUNTER — Inpatient Hospital Stay: Payer: BLUE CROSS/BLUE SHIELD

## 2018-09-16 ENCOUNTER — Inpatient Hospital Stay: Payer: BLUE CROSS/BLUE SHIELD | Attending: Oncology | Admitting: Oncology

## 2018-09-16 ENCOUNTER — Other Ambulatory Visit: Payer: Self-pay

## 2018-09-16 ENCOUNTER — Encounter: Payer: Self-pay | Admitting: Oncology

## 2018-09-16 VITALS — BP 133/87 | HR 91 | Temp 97.8°F | Resp 18 | Ht 67.0 in | Wt 206.0 lb

## 2018-09-16 DIAGNOSIS — K31819 Angiodysplasia of stomach and duodenum without bleeding: Secondary | ICD-10-CM | POA: Diagnosis not present

## 2018-09-16 DIAGNOSIS — D509 Iron deficiency anemia, unspecified: Secondary | ICD-10-CM

## 2018-09-16 DIAGNOSIS — K449 Diaphragmatic hernia without obstruction or gangrene: Secondary | ICD-10-CM | POA: Diagnosis not present

## 2018-09-16 LAB — CBC WITH DIFFERENTIAL/PLATELET
ABS IMMATURE GRANULOCYTES: 0.04 10*3/uL (ref 0.00–0.07)
BASOS ABS: 0 10*3/uL (ref 0.0–0.1)
Basophils Relative: 1 %
Eosinophils Absolute: 0.3 10*3/uL (ref 0.0–0.5)
Eosinophils Relative: 6 %
HEMATOCRIT: 43 % (ref 39.0–52.0)
Hemoglobin: 13.2 g/dL (ref 13.0–17.0)
IMMATURE GRANULOCYTES: 1 %
LYMPHS ABS: 1.8 10*3/uL (ref 0.7–4.0)
LYMPHS PCT: 41 %
MCH: 25.1 pg — ABNORMAL LOW (ref 26.0–34.0)
MCHC: 30.7 g/dL (ref 30.0–36.0)
MCV: 81.9 fL (ref 80.0–100.0)
Monocytes Absolute: 0.3 10*3/uL (ref 0.1–1.0)
Monocytes Relative: 7 %
NEUTROS ABS: 2 10*3/uL (ref 1.7–7.7)
NEUTROS PCT: 44 %
Platelets: 186 10*3/uL (ref 150–400)
RBC: 5.25 MIL/uL (ref 4.22–5.81)
RDW: 20.1 % — ABNORMAL HIGH (ref 11.5–15.5)
WBC: 4.4 10*3/uL (ref 4.0–10.5)
nRBC: 0 % (ref 0.0–0.2)

## 2018-09-16 LAB — IRON AND TIBC
Iron: 28 ug/dL — ABNORMAL LOW (ref 45–182)
SATURATION RATIOS: 6 % — AB (ref 17.9–39.5)
TIBC: 482 ug/dL — AB (ref 250–450)
UIBC: 454 ug/dL

## 2018-09-16 LAB — FERRITIN: Ferritin: 17 ng/mL — ABNORMAL LOW (ref 24–336)

## 2018-09-16 NOTE — Progress Notes (Signed)
Hematology/Oncology Consult note [facility]  [phone] [phone]  Patient Care Team: System, Pcp Not In as PCP - General   Name of the patient: Ronnie StallingRonnie Newport  [id number]  [birth year]   Date of visit: 09/16/18  Diagnosis- iron deficiency anemia likely due to gastric vascular ectasia   Chief complaint/ Reason for visit-routine follow-up of iron deficiency anemia  Heme/Onc history: patient is a 47 year old male who has been referred to us for evaluation and management of anemia. Recent CBC from 02/16/2018 showed a white count of 3.5, H&H of 6.4/26.8 with an MCV of 63 and a platelet count of 253. He had an EGD in June 2019 which showed a 5 cm hiatal hernia and gastric antral vascular ectasia without bleeding.Subsequently patient has had 2 EGDs with GI and was reported to have some bleeding from his stomach which was cauterized. He also underwent a colonoscopy which did not reveal any evidence of bleeding. He was taking oral iron in the past but has not taken any iron since the last 3 months. Reports no dark melanotic stools or bright red blood in his stool. He occasionally takes Aleve for headache once a month. He reports problems with sexual function. Does not report any significant fatigue  Results of blood work from 06/27/2018 were as follows: CBC showed white count of 3.6, H&H of 8.1/28.7 with an MCV of 57.9 and a platelet count of 274.  CMP was within normal limits.  B12 and folate were normal.  Ferritin levels were low at 8.  Iron study showed a low iron saturation of 4%.  TIBC was elevated at 575.  Reticulocyte count was inappropriately low for the degree of anemia.  Haptoglobin was normal at 64 and TSH was normal.  Interval history-patient did not have significant fatigue when he was anemic and he feels no different today.  The only difference he is noticed is that he does not crave for ice as much.  Other than that he is doing  well  ECOG PS- 0 Pain scale- 0 Opioid associated constipation- no  Review of systems- Review of Systems  Constitutional: Negative for chills, fever, malaise/fatigue and weight loss.  HENT: Negative for congestion, ear discharge and nosebleeds.   Eyes: Negative for blurred vision.  Respiratory: Negative for cough, hemoptysis, sputum production, shortness of breath and wheezing.   Cardiovascular: Negative for chest pain, palpitations, orthopnea and claudication.  Gastrointestinal: Negative for abdominal pain, blood in stool, constipation, diarrhea, heartburn, melena, nausea and vomiting.  Genitourinary: Negative for dysuria, flank pain, frequency, hematuria and urgency.  Musculoskeletal: Negative for back pain, joint pain and myalgias.  Skin: Negative for rash.  Neurological: Negative for dizziness, tingling, focal weakness, seizures, weakness and headaches.  Endo/Heme/Allergies: Does not bruise/bleed easily.  Psychiatric/Behavioral: Negative for depression and suicidal ideas. The patient does not have insomnia.        No Known Allergies   Past Medical History:  Diagnosis Date  . Anemia   . Arthritis   . Complication of anesthesia    PT WAS [facility] ON [facility]06[facility]15 AND AFTER INDUCTION PT ASPIRATED SCANT AMOUNT OF GASTRIC CONTENTS AND CASE WAS CANCELLED  . Deviated septum   . Diabetes mellitus (HCC)    on metformin  . GERD (gastroesophageal reflux disease)   . Hoarseness of voice    SINCE BEING INTUBATED ON 10-06-15  . Hypertension    PT DENIES DURING 10-14-15 PHONE INTERVIEW   .  Nasal congestion    LONG TERM OBSTRUCTION/ENLARGED TURBINATES  . Pancreatitis 05/2014  . Pancreatitis   . PONV (postoperative nausea and vomiting)   . Sleep apnea     had surgery no longer uses cpap     Past Surgical History:  Procedure Laterality Date  . ESOPHAGOGASTRODUODENOSCOPY (EGD) WITH PROPOFOL N/A 02/28/2018   Procedure: ESOPHAGOGASTRODUODENOSCOPY (EGD)  WITH PROPOFOL;  Surgeon: Jeani Hawking, MD;  Location: [facility];  Service: Endoscopy;  Laterality: N/A;  . ESOPHAGOGASTRODUODENOSCOPY (EGD) WITH PROPOFOL N/A 04/18/2018   Procedure: ESOPHAGOGASTRODUODENOSCOPY (EGD) WITH PROPOFOL;  Surgeon: Jeani Hawking, MD;  Location: [facility];  Service: Endoscopy;  Laterality: N/A;  . HOT HEMOSTASIS N/A 02/28/2018   Procedure: HOT HEMOSTASIS (ARGON PLASMA COAGULATION/BICAP);  Surgeon: Jeani Hawking, MD;  Location: [facility];  Service: Endoscopy;  Laterality: N/A;  . HOT HEMOSTASIS N/A 04/18/2018   Procedure: HOT HEMOSTASIS (ARGON PLASMA COAGULATION/BICAP);  Surgeon: Jeani Hawking, MD;  Location: [facility];  Service: Endoscopy;  Laterality: N/A;  . NASAL SEPTOPLASTY W/ TURBINOPLASTY Bilateral 10/17/2015   Procedure: Nasal Septoplasty, Bilateral Partial Reduction Inferior Turbinates ;  Surgeon: Vernie Murders, MD;  Location: [facility];  Service: ENT;  Laterality: Bilateral;  . SHOULDER SURGERY Right    ARTHROSCOPIC  . WISDOM TOOTH EXTRACTION      Social History   Socioeconomic History  . Marital status: Married    Spouse name: Not on file  . Number of children: Not on file  . Years of education: Not on file  . Highest education level: Not on file  Occupational History  . Occupation: Sports administrator: Sports coach  Social Needs  . Financial resource strain: Not on file  . Food insecurity:    Worry: Not on file    Inability: Not on file  . Transportation needs:    Medical: Not on file    Non-medical: Not on file  Tobacco Use  . Smoking status: Never Smoker  . Smokeless tobacco: Never Used  Substance and Sexual Activity  . Alcohol use: Yes    Comment: occasional, last use saturday 2-3 beers  . Drug use: No  . Sexual activity: Yes  Lifestyle  . Physical activity:    Days per week: Not on file    Minutes per session: Not on file  . Stress: Not on file  Relationships  . Social connections:    Talks on phone: Not on file    Gets  together: Not on file    Attends religious service: Not on file    Active member of club or organization: Not on file    Attends meetings of clubs or organizations: Not on file    Relationship status: Not on file  . Intimate partner violence:    Fear of current or ex partner: Not on file    Emotionally abused: Not on file    Physically abused: Not on file    Forced sexual activity: Not on file  Other Topics Concern  . Not on file  Social History Narrative  . Not on file    Family History  Problem Relation Age of Onset  . Diabetes Mother   . Hypertension Mother   . Diabetes Father   . Cancer Unknown        paternal aunt     Current Outpatient Medications:  .  dexlansoprazole (DEXILANT) 60 MG capsule, Take 60 mg by mouth daily., Disp: , Rfl:  .  glimepiride (AMARYL) 2 MG tablet, Take 1 tablet (2  mg total) by mouth 2 (two) times daily., Disp: 60 tablet, Rfl: 3 .  metFORMIN (GLUCOPHAGE) 500 MG tablet, Take 500 mg by mouth 2 (two) times daily., Disp: , Rfl:   Physical exam:  Vitals:   09/16/18 1304  BP: 133/87  Pulse: 91  Resp: 18  Temp: 97.8 F (36.6 C)  TempSrc: Tympanic  SpO2: 97%  Weight: 206 lb (93.4 kg)  Height: 5\' 7"  (1.702 m)   Physical Exam  Constitutional: He is oriented to person, place, and time.  HENT:  Head: Normocephalic and atraumatic.  Eyes: Pupils are equal, round, and reactive to light. EOM are normal.  Neck: Normal range of motion.  Cardiovascular: Normal rate, regular rhythm and normal heart sounds.  Pulmonary/Chest: Effort normal and breath sounds normal.  Abdominal: Soft. Bowel sounds are normal.  Neurological: He is alert and oriented to person, place, and time.  Skin: Skin is warm and dry.     CMP Latest Ref Rng & Units 06/27/2018  Glucose 70 - 99 mg/dL 161(W)  BUN 6 - 20 mg/dL 13  Creatinine 9.60 - 4.54 mg/dL 0.98  Sodium 119 - 147 mmol/L 136  Potassium 3.5 - 5.1 mmol/L 3.8  Chloride 98 - 111 mmol/L 103  CO2 22 - 32 mmol/L 25   Calcium 8.9 - 10.3 mg/dL 9.1  Total Protein 6.5 - 8.1 g/dL 8.2(N)  Total Bilirubin 0.3 - 1.2 mg/dL 0.8  Alkaline Phos 38 - 126 U/L 73  AST 15 - 41 U/L 32  ALT 0 - 44 U/L 29   CBC Latest Ref Rng & Units 09/16/2018  WBC 4.0 - 10.5 K/uL 4.4  Hemoglobin 13.0 - 17.0 g/dL 56.2  Hematocrit 13.0 - 52.0 % 43.0  Platelets 150 - 400 K/uL 186      Assessment and plan- Patient is a 47 y.o. male with iron deficiency anemia likely secondary to gastric antral vascular ectasia  After 2 doses of IV iron patient's hemoglobin is significantly improved from 8.1-13.2.  His iron studies from today are pending.  I do not think that he will need IV iron at this time.  We will call him if his iron levels are still low.  Repeat CBC ferritin and iron studies in 3 in 6 months and I will see him back in 6 months   Visit Diagnosis 1. Iron deficiency anemia, unspecified iron deficiency anemia type      Dr. Owens Shark, MD, MPH [facility] [phone] 09/16/2018 1:18 PM

## 2018-09-16 NOTE — Progress Notes (Signed)
No new changes noted today 

## 2018-09-18 ENCOUNTER — Other Ambulatory Visit: Payer: Self-pay | Admitting: Oncology

## 2018-09-18 ENCOUNTER — Telehealth: Payer: Self-pay

## 2018-09-18 NOTE — Telephone Encounter (Signed)
Spoke with Ronnie Mejia to inform him, Per Dr Smith Robertao he need 2 doses of Feraheme. The patient was agreeable and understanding to get Feraheme treatment.

## 2018-09-18 NOTE — Telephone Encounter (Signed)
-----   Message from Creig HinesArchana C Rao, MD sent at 09/18/2018  8:04 AM EST ----- Hb has improved significantly but he is still iron deficient. I would like to give him 2 more doses of feraheme. He will keep future appointments as scheduled.Thanks, Ovidio KinArchana

## 2018-10-03 ENCOUNTER — Inpatient Hospital Stay: Payer: BLUE CROSS/BLUE SHIELD | Attending: Oncology

## 2018-10-03 VITALS — BP 123/79 | HR 71 | Temp 95.0°F | Resp 18

## 2018-10-03 DIAGNOSIS — D509 Iron deficiency anemia, unspecified: Secondary | ICD-10-CM | POA: Diagnosis not present

## 2018-10-03 MED ORDER — SODIUM CHLORIDE 0.9 % IV SOLN
Freq: Once | INTRAVENOUS | Status: AC
  Administered 2018-10-03: 11:00:00 via INTRAVENOUS
  Filled 2018-10-03: qty 250

## 2018-10-03 MED ORDER — SODIUM CHLORIDE 0.9 % IV SOLN
510.0000 mg | Freq: Once | INTRAVENOUS | Status: AC
  Administered 2018-10-03: 510 mg via INTRAVENOUS
  Filled 2018-10-03: qty 17

## 2018-10-03 NOTE — Progress Notes (Signed)
Pt declines to stay for full 30 minute observation. Pt and VS stable at discharge.

## 2018-10-10 ENCOUNTER — Inpatient Hospital Stay: Payer: BLUE CROSS/BLUE SHIELD

## 2018-10-10 VITALS — BP 122/81 | HR 72 | Temp 96.2°F | Resp 18

## 2018-10-10 DIAGNOSIS — D509 Iron deficiency anemia, unspecified: Secondary | ICD-10-CM

## 2018-10-10 MED ORDER — SODIUM CHLORIDE 0.9 % IV SOLN
510.0000 mg | Freq: Once | INTRAVENOUS | Status: AC
  Administered 2018-10-10: 510 mg via INTRAVENOUS
  Filled 2018-10-10: qty 17

## 2018-10-10 MED ORDER — SODIUM CHLORIDE 0.9 % IV SOLN
Freq: Once | INTRAVENOUS | Status: AC
  Administered 2018-10-10: 14:00:00 via INTRAVENOUS
  Filled 2018-10-10: qty 250

## 2018-10-14 ENCOUNTER — Other Ambulatory Visit: Payer: Self-pay

## 2018-10-14 MED ORDER — METFORMIN HCL 500 MG PO TABS: 500.0000 mg | ORAL_TABLET | Freq: Two times a day (BID) | ORAL | 0 refills | Status: DC

## 2018-10-14 NOTE — Telephone Encounter (Signed)
Spoke with he need appt for further refills and need Hemoglobin A1c check

## 2018-11-03 ENCOUNTER — Other Ambulatory Visit: Payer: Self-pay

## 2018-11-03 MED ORDER — GLIMEPIRIDE 2 MG PO TABS: 2.0000 mg | ORAL_TABLET | Freq: Two times a day (BID) | ORAL | 3 refills | Status: DC

## 2018-11-04 DIAGNOSIS — Z131 Encounter for screening for diabetes mellitus: Secondary | ICD-10-CM | POA: Diagnosis not present

## 2018-11-04 DIAGNOSIS — Z713 Dietary counseling and surveillance: Secondary | ICD-10-CM | POA: Diagnosis not present

## 2018-11-04 DIAGNOSIS — Z136 Encounter for screening for cardiovascular disorders: Secondary | ICD-10-CM | POA: Diagnosis not present

## 2018-11-04 DIAGNOSIS — Z13220 Encounter for screening for lipoid disorders: Secondary | ICD-10-CM | POA: Diagnosis not present

## 2018-12-17 ENCOUNTER — Inpatient Hospital Stay: Payer: BLUE CROSS/BLUE SHIELD | Attending: Oncology

## 2018-12-17 DIAGNOSIS — D509 Iron deficiency anemia, unspecified: Secondary | ICD-10-CM | POA: Diagnosis not present

## 2018-12-17 LAB — IRON AND TIBC
Iron: 110 ug/dL (ref 45–182)
Saturation Ratios: 29 % (ref 17.9–39.5)
TIBC: 374 ug/dL (ref 250–450)
UIBC: 264 ug/dL

## 2018-12-17 LAB — CBC WITH DIFFERENTIAL/PLATELET
Abs Immature Granulocytes: 0.03 10*3/uL (ref 0.00–0.07)
Basophils Absolute: 0 10*3/uL (ref 0.0–0.1)
Basophils Relative: 0 %
Eosinophils Absolute: 0.1 10*3/uL (ref 0.0–0.5)
Eosinophils Relative: 2 %
HCT: 43.7 % (ref 39.0–52.0)
Hemoglobin: 15 g/dL (ref 13.0–17.0)
Immature Granulocytes: 1 %
Lymphocytes Relative: 49 %
Lymphs Abs: 1.9 10*3/uL (ref 0.7–4.0)
MCH: 29 pg (ref 26.0–34.0)
MCHC: 34.3 g/dL (ref 30.0–36.0)
MCV: 84.5 fL (ref 80.0–100.0)
Monocytes Absolute: 0.2 10*3/uL (ref 0.1–1.0)
Monocytes Relative: 6 %
NEUTROS PCT: 42 %
Neutro Abs: 1.7 10*3/uL (ref 1.7–7.7)
Platelets: 178 10*3/uL (ref 150–400)
RBC: 5.17 MIL/uL (ref 4.22–5.81)
RDW: 15.3 % (ref 11.5–15.5)
WBC: 3.9 10*3/uL — ABNORMAL LOW (ref 4.0–10.5)
nRBC: 0 % (ref 0.0–0.2)

## 2018-12-17 LAB — FERRITIN: Ferritin: 173 ng/mL (ref 24–336)

## 2019-03-17 ENCOUNTER — Other Ambulatory Visit: Payer: BLUE CROSS/BLUE SHIELD

## 2019-03-17 ENCOUNTER — Ambulatory Visit: Payer: BLUE CROSS/BLUE SHIELD | Admitting: Oncology

## 2019-05-12 ENCOUNTER — Inpatient Hospital Stay: Payer: BC Managed Care – PPO | Attending: Oncology

## 2019-05-12 ENCOUNTER — Other Ambulatory Visit: Payer: Self-pay

## 2019-05-12 ENCOUNTER — Telehealth: Payer: Self-pay

## 2019-05-12 ENCOUNTER — Encounter: Payer: Self-pay | Admitting: Oncology

## 2019-05-12 ENCOUNTER — Inpatient Hospital Stay: Payer: BC Managed Care – PPO | Admitting: Oncology

## 2019-05-12 VITALS — BP 129/86 | HR 71 | Temp 96.2°F | Wt 196.1 lb

## 2019-05-12 DIAGNOSIS — K31819 Angiodysplasia of stomach and duodenum without bleeding: Secondary | ICD-10-CM | POA: Diagnosis not present

## 2019-05-12 DIAGNOSIS — E119 Type 2 diabetes mellitus without complications: Secondary | ICD-10-CM | POA: Diagnosis not present

## 2019-05-12 DIAGNOSIS — D509 Iron deficiency anemia, unspecified: Secondary | ICD-10-CM

## 2019-05-12 DIAGNOSIS — D709 Neutropenia, unspecified: Secondary | ICD-10-CM | POA: Diagnosis not present

## 2019-05-12 DIAGNOSIS — D500 Iron deficiency anemia secondary to blood loss (chronic): Secondary | ICD-10-CM

## 2019-05-12 LAB — CBC WITH DIFFERENTIAL/PLATELET
Abs Immature Granulocytes: 0.11 10*3/uL — ABNORMAL HIGH (ref 0.00–0.07)
Basophils Absolute: 0 10*3/uL (ref 0.0–0.1)
Basophils Relative: 0 %
Eosinophils Absolute: 0.1 10*3/uL (ref 0.0–0.5)
Eosinophils Relative: 2 %
HCT: 46.9 % (ref 39.0–52.0)
Hemoglobin: 15.9 g/dL (ref 13.0–17.0)
Immature Granulocytes: 3 %
Lymphocytes Relative: 60 %
Lymphs Abs: 2 10*3/uL (ref 0.7–4.0)
MCH: 29.7 pg (ref 26.0–34.0)
MCHC: 33.9 g/dL (ref 30.0–36.0)
MCV: 87.7 fL (ref 80.0–100.0)
Monocytes Absolute: 0.3 10*3/uL (ref 0.1–1.0)
Monocytes Relative: 8 %
Neutro Abs: 0.9 10*3/uL — ABNORMAL LOW (ref 1.7–7.7)
Neutrophils Relative %: 27 %
Platelets: 174 10*3/uL (ref 150–400)
RBC: 5.35 MIL/uL (ref 4.22–5.81)
RDW: 13.9 % (ref 11.5–15.5)
WBC: 3.3 10*3/uL — ABNORMAL LOW (ref 4.0–10.5)
nRBC: 0 % (ref 0.0–0.2)

## 2019-05-12 LAB — IRON AND TIBC
Iron: 91 ug/dL (ref 45–182)
Saturation Ratios: 23 % (ref 17.9–39.5)
TIBC: 392 ug/dL (ref 250–450)
UIBC: 301 ug/dL

## 2019-05-12 LAB — FERRITIN: Ferritin: 197 ng/mL (ref 24–336)

## 2019-05-12 NOTE — Telephone Encounter (Signed)
Contacted patient in regards to gap in care per insurance and they disconnected the call with me.

## 2019-05-12 NOTE — Progress Notes (Signed)
Hematology/Oncology Consult note [facility]  Telephone:([phone]) [phone] [phone]  Patient Care Team: System, Pcp Not In as PCP - General   Name of the patient: Ronnie Mejia  [id number]  1971/07/11   Date of visit: 05/12/19  Diagnosis-iron deficiency anemia secondary to gastric vascular ectasia  Chief complaint/ Reason for visit-routine follow-up of iron deficiency anemia  Heme/Onc history: patient is a 48 year old male who has been referred to us for evaluation and management of anemia. Recent CBC from 02/16/2018 showed a white count of 3.5, H&H of 6.4/26.8 with an MCV of 63 and a platelet count of 253. He had an EGD in June 2019 which showed a 5 cm hiatal hernia and gastric antral vascular ectasia without bleeding.Subsequently patient has had 2 EGDs with GI and was reported to have some bleeding from his stomach which was cauterized. He also underwent a colonoscopy which did not reveal any evidence of bleeding. He was taking oral iron in the past but has not taken any iron since the last 3 months. Reports no dark melanotic stools or bright red blood in his stool. He occasionally takes Aleve for headache once a month. He reports problems with sexual function. Does not report any significant fatigue  Results of blood work from 06/27/2018 were as follows: CBC showed white count of 3.6, H&H of 8.1/28.7 with an MCV of 57.9 and a platelet count of 274. CMP was within normal limits. B12 and folate were normal. Ferritin levels were low at 8. Iron study showed a low iron saturation of 4%. TIBC was elevated at 575. Reticulocyte count was inappropriately low for the degree of anemia. Haptoglobin was normal at 64 and TSH was normal.  Patient received 2 doses of Feraheme in August 2019  Interval history-overall he feels well.  Appetite and weight are stable.  Denies any over-the-counter medications or herbal supplements.  Denies any recent changes in  medications.  Denies any episodes of infections.  ECOG PS- 0 Pain scale- 0   Review of systems- Review of Systems  Constitutional: Negative for chills, fever, malaise/fatigue and weight loss.  HENT: Negative for congestion, ear discharge and nosebleeds.   Eyes: Negative for blurred vision.  Respiratory: Negative for cough, hemoptysis, sputum production, shortness of breath and wheezing.   Cardiovascular: Negative for chest pain, palpitations, orthopnea and claudication.  Gastrointestinal: Negative for abdominal pain, blood in stool, constipation, diarrhea, heartburn, melena, nausea and vomiting.  Genitourinary: Negative for dysuria, flank pain, frequency, hematuria and urgency.  Musculoskeletal: Negative for back pain, joint pain and myalgias.  Skin: Negative for rash.  Neurological: Negative for dizziness, tingling, focal weakness, seizures, weakness and headaches.  Endo/Heme/Allergies: Does not bruise/bleed easily.  Psychiatric/Behavioral: Negative for depression and suicidal ideas. The patient does not have insomnia.       No Known Allergies   Past Medical History:  Diagnosis Date  . Anemia   . Arthritis   . Complication of anesthesia    PT WAS SHEDULED TO HAVE [facility] ON 10-06-15 AND AFTER INDUCTION PT ASPIRATED SCANT AMOUNT OF GASTRIC CONTENTS AND CASE WAS CANCELLED  . Deviated septum   . Diabetes mellitus (HCC)    on metformin  . GERD (gastroesophageal reflux disease)   . Hoarseness of voice    SINCE BEING INTUBATED ON 10-06-15  . Hypertension    PT DENIES DURING 10-14-15 PHONE INTERVIEW   . Nasal congestion    LONG TERM OBSTRUCTION/ENLARGED TURBINATES  . Pancreatitis 05/2014  .  Pancreatitis   . PONV (postoperative nausea and vomiting)   . Sleep apnea     had surgery no longer uses cpap     Past Surgical History:  Procedure Laterality Date  . ESOPHAGOGASTRODUODENOSCOPY (EGD) WITH PROPOFOL N/A 02/28/2018   Procedure: ESOPHAGOGASTRODUODENOSCOPY  (EGD) WITH PROPOFOL;  Surgeon: Carol Ada, MD;  Location: [facility];  Service: Endoscopy;  Laterality: N/A;  . ESOPHAGOGASTRODUODENOSCOPY (EGD) WITH PROPOFOL N/A 04/18/2018   Procedure: ESOPHAGOGASTRODUODENOSCOPY (EGD) WITH PROPOFOL;  Surgeon: Carol Ada, MD;  Location: [facility];  Service: Endoscopy;  Laterality: N/A;  . HOT HEMOSTASIS N/A 02/28/2018   Procedure: HOT HEMOSTASIS (ARGON PLASMA COAGULATION/BICAP);  Surgeon: Carol Ada, MD;  Location: [facility];  Service: Endoscopy;  Laterality: N/A;  . HOT HEMOSTASIS N/A 04/18/2018   Procedure: HOT HEMOSTASIS (ARGON PLASMA COAGULATION/BICAP);  Surgeon: Carol Ada, MD;  Location: [facility];  Service: Endoscopy;  Laterality: N/A;  . NASAL SEPTOPLASTY W/ TURBINOPLASTY Bilateral 10/17/2015   Procedure: Nasal Septoplasty, Bilateral Partial Reduction Inferior Turbinates ;  Surgeon: Margaretha Sheffield, MD;  Location: [facility];  Service: ENT;  Laterality: Bilateral;  . SHOULDER SURGERY Right    ARTHROSCOPIC  . WISDOM TOOTH EXTRACTION      Social History   Socioeconomic History  . Marital status: Married    Spouse name: Not on file  . Number of children: Not on file  . Years of education: Not on file  . Highest education level: Not on file  Occupational History  . Occupation: Music therapist: Secretary/administrator  Social Needs  . Financial resource strain: Not on file  . Food insecurity    Worry: Not on file    Inability: Not on file  . Transportation needs    Medical: Not on file    Non-medical: Not on file  Tobacco Use  . Smoking status: Never Smoker  . Smokeless tobacco: Never Used  Substance and Sexual Activity  . Alcohol use: Yes    Comment: occasional, last use saturday 2-3 beers  . Drug use: No  . Sexual activity: Yes  Lifestyle  . Physical activity    Days per week: Not on file    Minutes per session: Not on file  . Stress: Not on file  Relationships  . Social Herbalist on phone: Not on file    Gets  together: Not on file    Attends religious service: Not on file    Active member of club or organization: Not on file    Attends meetings of clubs or organizations: Not on file    Relationship status: Not on file  . Intimate partner violence    Fear of current or ex partner: Not on file    Emotionally abused: Not on file    Physically abused: Not on file    Forced sexual activity: Not on file  Other Topics Concern  . Not on file  Social History Narrative  . Not on file    Family History  Problem Relation Age of Onset  . Diabetes Mother   . Hypertension Mother   . Diabetes Father   . Cancer Other        paternal aunt     Current Outpatient Medications:  .  dexlansoprazole (DEXILANT) 60 MG capsule, Take 60 mg by mouth daily., Disp: , Rfl:  .  glimepiride (AMARYL) 2 MG tablet, Take 1 tablet (2 mg total) by mouth 2 (two) times daily., Disp: 60 tablet, Rfl: 3 .  metFORMIN (GLUCOPHAGE) 500 MG tablet, Take 1 tablet (500 mg total) by mouth 2 (two) times daily., Disp: 180 tablet, Rfl: 0  Physical exam:  Vitals:   05/12/19 0945  BP: 129/86  Pulse: 71  Temp: (!) 96.2 F (35.7 C)  TempSrc: Tympanic  Weight: 196 lb 1.6 oz (89 kg)   Physical Exam HENT:     Head: Normocephalic and atraumatic.  Eyes:     Pupils: Pupils are equal, round, and reactive to light.  Neck:     Musculoskeletal: Normal range of motion.  Cardiovascular:     Rate and Rhythm: Normal rate and regular rhythm.     Heart sounds: Normal heart sounds.  Pulmonary:     Effort: Pulmonary effort is normal.     Breath sounds: Normal breath sounds.  Abdominal:     General: Bowel sounds are normal.     Palpations: Abdomen is soft.  Skin:    General: Skin is warm and dry.  Neurological:     Mental Status: He is alert and oriented to person, place, and time.      CMP Latest Ref Rng & Units 06/27/2018  Glucose 70 - 99 mg/dL 454(U164(H)  BUN 6 - 20 mg/dL 13  Creatinine 9.810.61 - 1.911.24 mg/dL 4.780.68  Sodium 295135 - 621145 mmol/L  136  Potassium 3.5 - 5.1 mmol/L 3.8  Chloride 98 - 111 mmol/L 103  CO2 22 - 32 mmol/L 25  Calcium 8.9 - 10.3 mg/dL 9.1  Total Protein 6.5 - 8.1 g/dL 3.0(Q8.6(H)  Total Bilirubin 0.3 - 1.2 mg/dL 0.8  Alkaline Phos 38 - 126 U/L 73  AST 15 - 41 U/L 32  ALT 0 - 44 U/L 29   CBC Latest Ref Rng & Units 05/12/2019  WBC 4.0 - 10.5 K/uL 3.3(L)  Hemoglobin 13.0 - 17.0 g/dL 65.715.9  Hematocrit 84.639.0 - 52.0 % 46.9  Platelets 150 - 400 K/uL 174     Assessment and plan- Patient is a 48 y.o. male with following issues:  1.  Iron deficiency anemia: Patient has not required IV iron close to a year now.  Hemoglobin is now normal and iron studies are normal.  He did not require IV iron at this time.  2.  Leukopenia/neutropenia.  Patient was noted to have mild leukopenia with a white count of 3.9 3 months ago.  Was down to 3.3 today.  ANC also down from 1.7-0.9.  Patient reports no new changes in his medications or over-the-counter medications or herbal supplements.  I am inclined to monitor this conservatively with a repeat CBC with differential in 3 months I will also check B12, folate, HIV and hepatitis C antibody at that time.  We will check a smear review today I will see him back in 6 months time with a CBC with differential ferritin and iron studies     Visit Diagnosis 1. Iron deficiency anemia, unspecified iron deficiency anemia type   2. Neutropenia, unspecified type [facility](HCC)      Dr. Owens SharkArchana Rao, MD, MPH [facility] [phone] 05/12/2019 10:47 AM

## 2019-05-26 ENCOUNTER — Ambulatory Visit (INDEPENDENT_AMBULATORY_CARE_PROVIDER_SITE_OTHER): Payer: BC Managed Care – PPO | Admitting: Adult Health

## 2019-05-26 ENCOUNTER — Other Ambulatory Visit: Payer: Self-pay

## 2019-05-26 VITALS — BP 130/94 | HR 80 | Resp 16 | Ht 67.0 in | Wt 197.0 lb

## 2019-05-26 DIAGNOSIS — Z0001 Encounter for general adult medical examination with abnormal findings: Secondary | ICD-10-CM

## 2019-05-26 DIAGNOSIS — R300 Dysuria: Secondary | ICD-10-CM | POA: Diagnosis not present

## 2019-05-26 DIAGNOSIS — I10 Essential (primary) hypertension: Secondary | ICD-10-CM

## 2019-05-26 DIAGNOSIS — E1165 Type 2 diabetes mellitus with hyperglycemia: Secondary | ICD-10-CM | POA: Diagnosis not present

## 2019-05-26 DIAGNOSIS — N521 Erectile dysfunction due to diseases classified elsewhere: Secondary | ICD-10-CM

## 2019-05-26 DIAGNOSIS — K219 Gastro-esophageal reflux disease without esophagitis: Secondary | ICD-10-CM

## 2019-05-26 LAB — POCT GLYCOSYLATED HEMOGLOBIN (HGB A1C): Hemoglobin A1C: 9.8 % — AB (ref 4.0–5.6)

## 2019-05-26 NOTE — Progress Notes (Signed)
[facility] [place], [place] [address]  Internal MEDICINE  Office Visit Note  Patient Name: Ronnie Mejia  [id number]  [id number]  Date of Service: 05/26/2019   Complaints/HPI Pt is here for establishment of PCP. Chief Complaint  Patient presents with  . Medical Management of Chronic Issues    wbc count is low per patient   . Annual Exam  . Quality Metric Gaps    eye exam , foot exam   . Hypertension  . Gastroesophageal Reflux   HPI Pt is here for annual wellness visit. He has not been seen for awhile. He has a history of HTN, DM and GERD. His hemoglobin A1C is 9.8 today. His blood pressure is elevated 130/94 today.  He denies any current issues with his GERD.          Current Medication: Outpatient Encounter Medications as of 05/26/2019  Medication Sig  . dexlansoprazole (DEXILANT) 60 MG capsule Take 60 mg by mouth daily.  Marland Kitchen glimepiride (AMARYL) 2 MG tablet Take 1 tablet (2 mg total) by mouth 2 (two) times daily.  . metFORMIN (GLUCOPHAGE) 500 MG tablet Take 1 tablet (500 mg total) by mouth 2 (two) times daily.   No facility-administered encounter medications on file as of 05/26/2019.     Surgical History: Past Surgical History:  Procedure Laterality Date  . ESOPHAGOGASTRODUODENOSCOPY (EGD) WITH PROPOFOL N/A 02/28/2018   Procedure: ESOPHAGOGASTRODUODENOSCOPY (EGD) WITH PROPOFOL;  Surgeon: Carol Ada, MD;  Location: [facility];  Service: Endoscopy;  Laterality: N/A;  . ESOPHAGOGASTRODUODENOSCOPY (EGD) WITH PROPOFOL N/A 04/18/2018   Procedure: ESOPHAGOGASTRODUODENOSCOPY (EGD) WITH PROPOFOL;  Surgeon: Carol Ada, MD;  Location: [facility];  Service: Endoscopy;  Laterality: N/A;  . HOT HEMOSTASIS N/A 02/28/2018   Procedure: HOT HEMOSTASIS (ARGON PLASMA COAGULATION/BICAP);  Surgeon: Carol Ada, MD;  Location: [facility];  Service: Endoscopy;  Laterality: N/A;  . HOT HEMOSTASIS N/A 04/18/2018   Procedure: HOT HEMOSTASIS (ARGON PLASMA  COAGULATION/BICAP);  Surgeon: Carol Ada, MD;  Location: [facility];  Service: Endoscopy;  Laterality: N/A;  . NASAL SEPTOPLASTY W/ TURBINOPLASTY Bilateral 10/17/2015   Procedure: Nasal Septoplasty, Bilateral Partial Reduction Inferior Turbinates ;  Surgeon: Margaretha Sheffield, MD;  Location: [facility];  Service: ENT;  Laterality: Bilateral;  . SHOULDER SURGERY Right    ARTHROSCOPIC  . WISDOM TOOTH EXTRACTION      Medical History: Past Medical History:  Diagnosis Date  . Anemia   . Arthritis   . Complication of anesthesia    PT WAS SHEDULED TO HAVE IGSS AT [place] ON 10-06-15 AND AFTER INDUCTION PT ASPIRATED SCANT AMOUNT OF GASTRIC CONTENTS AND CASE WAS CANCELLED  . Deviated septum   . Diabetes mellitus ([place])    on metformin  . GERD (gastroesophageal reflux disease)   . Hoarseness of voice    SINCE BEING INTUBATED ON 10-06-15  . Hypertension    PT DENIES DURING 10-14-15 PHONE INTERVIEW   . Nasal congestion    LONG TERM OBSTRUCTION/ENLARGED TURBINATES  . Pancreatitis 05/2014  . Pancreatitis   . PONV (postoperative nausea and vomiting)   . Sleep apnea     had surgery no longer uses cpap    Family History: Family History  Problem Relation Age of Onset  . Diabetes Mother   . Hypertension Mother   . Diabetes Father   . Cancer Other        paternal aunt    Social History   Socioeconomic History  . Marital status: Married  Spouse name: Not on file  . Number of children: Not on file  . Years of education: Not on file  . Highest education level: Not on file  Occupational History  . Occupation: Sports administratormechanic    Employer: [employer]  Social Needs  . Financial resource strain: Not on file  . Food insecurity    Worry: Not on file    Inability: Not on file  . Transportation needs    Medical: Not on file    Non-medical: Not on file  Tobacco Use  . Smoking status: Never Smoker  . Smokeless tobacco: Never Used  Substance and Sexual Activity  . Alcohol use: Yes     Comment: occasional, last use saturday 2-3 beers  . Drug use: No  . Sexual activity: Yes  Lifestyle  . Physical activity    Days per week: Not on file    Minutes per session: Not on file  . Stress: Not on file  Relationships  . Social Musicianconnections    Talks on phone: Not on file    Gets together: Not on file    Attends religious service: Not on file    Active member of club or organization: Not on file    Attends meetings of clubs or organizations: Not on file    Relationship status: Not on file  . Intimate partner violence    Fear of current or ex partner: Not on file    Emotionally abused: Not on file    Physically abused: Not on file    Forced sexual activity: Not on file  Other Topics Concern  . Not on file  Social History Narrative  . Not on file     Review of Systems  Constitutional: Negative.  Negative for chills, fatigue and unexpected weight change.  HENT: Negative.  Negative for congestion, rhinorrhea, sneezing and sore throat.   Eyes: Negative for redness.  Respiratory: Negative.  Negative for cough, chest tightness and shortness of breath.   Cardiovascular: Negative.  Negative for chest pain and palpitations.  Gastrointestinal: Negative.  Negative for abdominal pain, constipation, diarrhea, nausea and vomiting.  Endocrine: Negative.   Genitourinary: Negative.  Negative for dysuria and frequency.  Musculoskeletal: Negative.  Negative for arthralgias, back pain, joint swelling and neck pain.  Skin: Negative.  Negative for rash.  Allergic/Immunologic: Negative.   Neurological: Negative.  Negative for tremors and numbness.  Hematological: Negative for adenopathy. Does not bruise/bleed easily.  Psychiatric/Behavioral: Negative.  Negative for behavioral problems, sleep disturbance and suicidal ideas. The patient is not nervous/anxious.     Vital Signs: BP (!) 130/94 (BP Location: Left Arm, Patient Position: Sitting, Cuff Size: Normal)   Pulse 80   Resp 16   Ht 5'  7" (1.702 m)   Wt 197 lb (89.4 kg)   SpO2 98%   BMI 30.85 kg/m    Physical Exam Vitals signs and nursing note reviewed.  Constitutional:      General: He is not in acute distress.    Appearance: He is well-developed. He is not diaphoretic.  HENT:     Head: Normocephalic and atraumatic.     Mouth/Throat:     Pharynx: No oropharyngeal exudate.  Eyes:     Pupils: Pupils are equal, round, and reactive to light.  Neck:     Musculoskeletal: Normal range of motion and neck supple.     Thyroid: No thyromegaly.     Vascular: No JVD.     Trachea: No tracheal deviation.  Cardiovascular:  Rate and Rhythm: Normal rate and regular rhythm.     Heart sounds: Normal heart sounds. No murmur. No friction rub. No gallop.   Pulmonary:     Effort: Pulmonary effort is normal. No respiratory distress.     Breath sounds: Normal breath sounds. No wheezing or rales.  Chest:     Chest wall: No tenderness.  Abdominal:     Palpations: Abdomen is soft.     Tenderness: There is no abdominal tenderness. There is no guarding.  Musculoskeletal: Normal range of motion.  Lymphadenopathy:     Cervical: No cervical adenopathy.  Skin:    General: Skin is warm and dry.  Neurological:     Mental Status: He is alert and oriented to person, place, and time.     Cranial Nerves: No cranial nerve deficit.  Psychiatric:        Behavior: Behavior normal.        Thought Content: Thought content normal.        Judgment: Judgment normal.     Assessment/Plan: 140/88 1. Encounter for general adult medical examination with abnormal findings PT will get labs drawn, and will review results when available.  - CBC with Differential/Platelet - Lipid Panel With LDL/HDL Ratio - TSH - T4, free - Comprehensive metabolic panel - PSA  2. Uncontrolled type 2 diabetes mellitus with hyperglycemia (HCC) Pts A1C is 9.8 today. Will increase to 1500mg  of metformin daily.  Pt will check his blood sugar and keep a log.   -  Microalbumin, urine - POCT HgB A1C  3. Essential hypertension Pt recheck today was 140/88. Will follow up at next visit.   4. Gastroesophageal reflux disease without esophagitis Continue dexilant as prescribed.   5. Dysuria - UA/M w/rflx Culture, Routine - Microscopic Examination  6. Erectile dysfunction due to diseases classified elsewhere PT provided with General Counseling: Ronnie Mejia verbalizes understanding of the findings of todays visit and agrees with plan of treatment. I have discussed any further diagnostic evaluation that may be needed or ordered today. We also reviewed his medications today. he has been encouraged to call the office with any questions or concerns that should arise related to todays visit.  Orders Placed This Encounter  Procedures  . Microalbumin, urine  . UA/M w/rflx Culture, Routine  . POCT HgB A1C    No orders of the defined types were placed in this encounter.   Time spent: 30 Minutes   This patient was seen by Blima LedgerAdam Meliya Mcconahy AGNP-C in Collaboration with Dr Lyndon CodeFozia M Khan as a part of collaborative care agreement  Johnna AcostaAdam J. Jamell Laymon AGNP-C Internal Medicine

## 2019-05-28 LAB — MICROSCOPIC EXAMINATION
Casts: NONE SEEN /lpf
Epithelial Cells (non renal): NONE SEEN /hpf (ref 0–10)
WBC, UA: NONE SEEN /hpf (ref 0–5)

## 2019-05-28 LAB — UA/M W/RFLX CULTURE, ROUTINE
Bilirubin, UA: NEGATIVE
Ketones, UA: NEGATIVE
Leukocytes,UA: NEGATIVE
Nitrite, UA: NEGATIVE
RBC, UA: NEGATIVE
Specific Gravity, UA: >=1.03 — AB (ref 1.005–1.030)
Urobilinogen, Ur: 1 mg/dL (ref 0.2–1.0)
pH, UA: 5 (ref 5.0–7.5)

## 2019-05-28 LAB — MICROALBUMIN, URINE: Microalbumin, Urine: 91 ug/mL

## 2019-08-12 ENCOUNTER — Inpatient Hospital Stay: Payer: BC Managed Care – PPO

## 2019-08-18 ENCOUNTER — Inpatient Hospital Stay: Payer: BC Managed Care – PPO | Attending: Oncology

## 2019-08-26 ENCOUNTER — Ambulatory Visit (INDEPENDENT_AMBULATORY_CARE_PROVIDER_SITE_OTHER): Payer: BC Managed Care – PPO | Admitting: Internal Medicine

## 2019-08-26 ENCOUNTER — Other Ambulatory Visit: Payer: Self-pay

## 2019-08-26 ENCOUNTER — Encounter: Payer: Self-pay | Admitting: Nurse Practitioner

## 2019-08-26 DIAGNOSIS — D509 Iron deficiency anemia, unspecified: Secondary | ICD-10-CM

## 2019-08-26 DIAGNOSIS — E1165 Type 2 diabetes mellitus with hyperglycemia: Secondary | ICD-10-CM

## 2019-08-26 DIAGNOSIS — K219 Gastro-esophageal reflux disease without esophagitis: Secondary | ICD-10-CM

## 2019-08-26 LAB — POCT GLYCOSYLATED HEMOGLOBIN (HGB A1C): Hemoglobin A1C: 10.7 % — AB (ref 4.0–5.6)

## 2019-08-26 MED ORDER — ACCU-CHEK FASTCLIX LANCETS MISC: 2 refills | Status: AC

## 2019-08-26 MED ORDER — ACCU-CHEK GUIDE VI STRP: 1.0000 | ORAL_STRIP | Freq: Two times a day (BID) | 3 refills | Status: AC

## 2019-08-26 NOTE — Progress Notes (Signed)
[facility] [address] [place], [place] [address]  Internal MEDICINE  Office Visit Note  Patient Name: Ronnie Mejia  [id number]  [id number]  Date of Service: 08/29/2019  Chief Complaint  Patient presents with  . Diabetes  . Hypertension  . Gastroesophageal Reflux  . Quality Metric Gaps    diabetic eye exam  . Memory Loss    memory is foggy, tends to forget things he should remember     HPI  Pt is here for routine follow up. He does not feel well at times, blood sugars have been up and down, has been struggling to lose weight, He has not had his Labs yet but will go soon. Hg a1c is worse than last time. Pt has h/o sleep apnea and surgical correction done, no follow up sleep study is seen in the chart. H/o pancreatitis in the past. Pt also has h/o iron deficiency anemia due to blood loss ( had gastric antral ectasia). There has been an episode of low WBC as well. Pt has been seen by hematology and GI in the past   Current Medication: Outpatient Encounter Medications as of 08/26/2019  Medication Sig  . dexlansoprazole (DEXILANT) 60 MG capsule Take 60 mg by mouth daily.  Marland Kitchen glimepiride (AMARYL) 2 MG tablet Take 1 tablet (2 mg total) by mouth 2 (two) times daily.  . metFORMIN (GLUCOPHAGE) 500 MG tablet Take 1 tablet (500 mg total) by mouth 2 (two) times daily.  . [DISCONTINUED] Accu-Chek FastClix Lancets MISC by Does not apply route. Use as directed twice daily diag E11.65  . [DISCONTINUED] glucose blood (ACCU-CHEK GUIDE) test strip 1 each by Other route 2 (two) times daily. Use as instructed twice a daily e11.65   No facility-administered encounter medications on file as of 08/26/2019.     Surgical History: Past Surgical History:  Procedure Laterality Date  . ESOPHAGOGASTRODUODENOSCOPY (EGD) WITH PROPOFOL N/A 02/28/2018   Procedure: ESOPHAGOGASTRODUODENOSCOPY (EGD) WITH PROPOFOL;  Surgeon: Jeani Hawking, MD;  Location: [facility];  Service: Endoscopy;  Laterality:  N/A;  . ESOPHAGOGASTRODUODENOSCOPY (EGD) WITH PROPOFOL N/A 04/18/2018   Procedure: ESOPHAGOGASTRODUODENOSCOPY (EGD) WITH PROPOFOL;  Surgeon: Jeani Hawking, MD;  Location: [facility];  Service: Endoscopy;  Laterality: N/A;  . HOT HEMOSTASIS N/A 02/28/2018   Procedure: HOT HEMOSTASIS (ARGON PLASMA COAGULATION/BICAP);  Surgeon: Jeani Hawking, MD;  Location: [facility];  Service: Endoscopy;  Laterality: N/A;  . HOT HEMOSTASIS N/A 04/18/2018   Procedure: HOT HEMOSTASIS (ARGON PLASMA COAGULATION/BICAP);  Surgeon: Jeani Hawking, MD;  Location: [facility];  Service: Endoscopy;  Laterality: N/A;  . NASAL SEPTOPLASTY W/ TURBINOPLASTY Bilateral 10/17/2015   Procedure: Nasal Septoplasty, Bilateral Partial Reduction Inferior Turbinates ;  Surgeon: Vernie Murders, MD;  Location: [facility];  Service: ENT;  Laterality: Bilateral;  . SHOULDER SURGERY Right    ARTHROSCOPIC  . WISDOM TOOTH EXTRACTION      Medical History: Past Medical History:  Diagnosis Date  . Anemia   . Arthritis   . Complication of anesthesia    PT WAS SHEDULED TO HAVE IGSS AT [facility] ON 10-06-15 AND AFTER INDUCTION PT ASPIRATED SCANT AMOUNT OF GASTRIC CONTENTS AND CASE WAS CANCELLED  . Deviated septum   . Diabetes mellitus (HCC)    on metformin  . GERD (gastroesophageal reflux disease)   . Hoarseness of voice    SINCE BEING INTUBATED ON 10-06-15  . Hypertension    PT DENIES DURING 10-14-15 PHONE INTERVIEW   . Nasal congestion    LONG TERM  OBSTRUCTION/ENLARGED TURBINATES  . Pancreatitis 05/2014  . Pancreatitis   . PONV (postoperative nausea and vomiting)   . Sleep apnea     had surgery no longer uses cpap    Family History: Family History  Problem Relation Age of Onset  . Diabetes Mother   . Hypertension Mother   . Diabetes Father   . Cancer Other        paternal aunt    Social History   Socioeconomic History  . Marital status: Married    Spouse name: Not on file  . Number of children: Not on file  .  Years of education: Not on file  . Highest education level: Not on file  Occupational History  . Occupation: Sports administratormechanic    Employer: [employer]  Social Needs  . Financial resource strain: Not on file  . Food insecurity    Worry: Not on file    Inability: Not on file  . Transportation needs    Medical: Not on file    Non-medical: Not on file  Tobacco Use  . Smoking status: Never Smoker  . Smokeless tobacco: Never Used  Substance and Sexual Activity  . Alcohol use: Yes    Comment: occasional,   . Drug use: No  . Sexual activity: Yes  Lifestyle  . Physical activity    Days per week: Not on file    Minutes per session: Not on file  . Stress: Not on file  Relationships  . Social Musicianconnections    Talks on phone: Not on file    Gets together: Not on file    Attends religious service: Not on file    Active member of club or organization: Not on file    Attends meetings of clubs or organizations: Not on file    Relationship status: Not on file  . Intimate partner violence    Fear of current or ex partner: Not on file    Emotionally abused: Not on file    Physically abused: Not on file    Forced sexual activity: Not on file  Other Topics Concern  . Not on file  Social History Narrative  . Not on file    Review of Systems  Constitutional: Negative for chills, fatigue and unexpected weight change.  HENT: Negative for congestion, rhinorrhea, sneezing and sore throat.   Eyes: Negative for redness.  Respiratory: Negative for cough, chest tightness and shortness of breath.   Cardiovascular: Negative for chest pain and palpitations.  Gastrointestinal: Negative for abdominal pain, constipation, diarrhea, nausea and vomiting.  Genitourinary: Negative for dysuria and frequency.  Musculoskeletal: Negative for arthralgias, back pain, joint swelling and neck pain.  Skin: Negative for rash.  Neurological: Negative.  Negative for tremors and numbness.  Hematological: Negative for  adenopathy. Does not bruise/bleed easily.  Psychiatric/Behavioral: Negative for behavioral problems (Depression), sleep disturbance and suicidal ideas. The patient is not nervous/anxious.     Vital Signs: BP (!) 127/93   Pulse 72   Temp 97.8 F (36.6 C)   Resp 16   Ht 5\' 7"  (1.702 m)   Wt 197 lb (89.4 kg)   SpO2 97%   BMI 30.85 kg/m    Physical Exam Constitutional:      General: He is not in acute distress.    Appearance: He is well-developed. He is not diaphoretic.  HENT:     Head: Normocephalic and atraumatic.     Mouth/Throat:     Pharynx: No oropharyngeal exudate.  Eyes:  Pupils: Pupils are equal, round, and reactive to light.  Neck:     Musculoskeletal: Normal range of motion and neck supple.     Thyroid: No thyromegaly.     Vascular: No JVD.     Trachea: No tracheal deviation.  Cardiovascular:     Rate and Rhythm: Normal rate and regular rhythm.     Heart sounds: Normal heart sounds. No murmur. No friction rub. No gallop.   Pulmonary:     Effort: Pulmonary effort is normal. No respiratory distress.     Breath sounds: No wheezing or rales.  Chest:     Chest wall: No tenderness.  Abdominal:     General: Bowel sounds are normal.     Palpations: Abdomen is soft.  Musculoskeletal: Normal range of motion.  Lymphadenopathy:     Cervical: No cervical adenopathy.  Skin:    General: Skin is warm and dry.  Neurological:     Mental Status: He is alert and oriented to person, place, and time.     Cranial Nerves: No cranial nerve deficit.  Psychiatric:        Behavior: Behavior normal.        Thought Content: Thought content normal.        Judgment: Judgment normal.    Assessment/Plan: 1. Type 2 diabetes mellitus with hyperglycemia, unspecified whether long term insulin use (HCC) - Worsening diabetic control, will dc current therapy, samples of Janumet 100/1000 mg po qd and Farxiga 5 mg - 10 mg are given to the pt.  - POCT HgB A1C - Ambulatory referral to  Ophthalmology  2. Gastroesophageal reflux disease without esophagitis - continue to follow with GI and Dexilant as before   3. Microcytic anemia - Hg is stable, will repeat CBC and ferritin level   General Counseling: Olamide verbalizes understanding of the findings of todays visit and agrees with plan of treatment. I have discussed any further diagnostic evaluation that may be needed or ordered today. We also reviewed his medications today. he has been encouraged to call the office with any questions or concerns that should arise related to todays visit.  Diabetes Counseling:  1. Addition of ACE inh/ ARB'S for nephroprotection. Microalbumin is updated. Statin use is discussed as well   2. Diabetic foot care, prevention of complications. Podiatry consult 3. Exercise and lose weight.  4. Diabetic eye examination, Diabetic eye exam is updated  5. Monitor blood sugar closlely. nutrition counseling.  6. Sign and symptoms of hypoglycemia including shaking sweating,confusion and headaches.   Orders Placed This Encounter  Procedures  . Ambulatory referral to Ophthalmology  . POCT HgB A1C    Time spent:25Minutes      Dr Lavera Guise Internal medicine

## 2019-09-07 ENCOUNTER — Telehealth: Payer: Self-pay | Admitting: Internal Medicine

## 2019-09-07 DIAGNOSIS — R509 Fever, unspecified: Secondary | ICD-10-CM

## 2019-09-07 NOTE — Telephone Encounter (Signed)
I gave him samples of Wilder Glade, is it low dose 5 mg or 10 mg that gave him headaches, what time he took this medicine and how the blood sugars were

## 2019-09-07 NOTE — Telephone Encounter (Signed)
Pt refused covid test

## 2019-09-07 NOTE — Telephone Encounter (Signed)
Spoke to Ronnie Mejia , pt started the [place] 5 mg , on 10/28 he took 5 mg every night for 5 days then started on the 10 mg  , patient started to have Headache on 11/4 and felt feverish on and off , pt was unable to check temperature he was camping , he stopped taking medications on Friday 11/6 and has felt nausea and low grade fever of 100.2 , Headache has not improved , pt states that his blood sugar is 140 - 160  He states that he is not eating because of the upset stomach, pt was Advised that this may be coming from something other then medication , per Dr Humphrey Rolls would go and have covid testing done, pt states that he does not want to be tested at this time, advised patient on staying home , increase fluids and tylenol for fever and headache, avoid contact with people for now, pt advised on calling back if  symptoms change , and or go to ED if worsens

## 2019-09-09 ENCOUNTER — Other Ambulatory Visit: Payer: Self-pay

## 2019-09-09 ENCOUNTER — Other Ambulatory Visit: Payer: Self-pay | Admitting: Internal Medicine

## 2019-09-09 DIAGNOSIS — Z20822 Contact with and (suspected) exposure to covid-19: Secondary | ICD-10-CM

## 2019-09-09 MED ORDER — DEXILANT 60 MG PO CPDR: 60.0000 mg | DELAYED_RELEASE_CAPSULE | Freq: Every day | ORAL | 1 refills | Status: DC

## 2019-09-09 MED ORDER — JANUMET XR 100-1000 MG PO TB24: 1.0000 | ORAL_TABLET | Freq: Every day | ORAL | 1 refills | Status: DC

## 2019-09-09 MED ORDER — FARXIGA 10 MG PO TABS: 10.0000 mg | ORAL_TABLET | Freq: Every day | ORAL | 1 refills | Status: DC

## 2019-09-11 LAB — NOVEL CORONAVIRUS, NAA: SARS-CoV-2, NAA: NOT DETECTED

## 2019-09-14 ENCOUNTER — Other Ambulatory Visit: Payer: Self-pay | Admitting: Internal Medicine

## 2019-09-14 MED ORDER — JANUMET XR 100-1000 MG PO TB24: 1.0000 | ORAL_TABLET | Freq: Every day | ORAL | 1 refills | Status: DC

## 2019-09-16 ENCOUNTER — Telehealth: Payer: Self-pay

## 2019-09-16 NOTE — Telephone Encounter (Signed)
Pt called and states that his insurance no longer covers dexilant, and janumet was over $600 for 90 day supply pt is asking for something else

## 2019-09-18 ENCOUNTER — Other Ambulatory Visit: Payer: Self-pay | Admitting: Adult Health

## 2019-09-18 MED ORDER — LINAGLIPTIN 5 MG PO TABS: 5.0000 mg | ORAL_TABLET | Freq: Every day | ORAL | 2 refills | Status: DC

## 2019-09-18 NOTE — Telephone Encounter (Signed)
Changed janument to Tradjenta,(it appears to be preferred by his insurance)  We can also try aciphex instead of dexilant if that's more affordable.

## 2019-09-18 NOTE — Telephone Encounter (Signed)
I spoke with the pharmacist at [facility] , his insurance does require 90 day supply for all medications, his Wilder Glade is covered for $15 , Janumet with coupon will also be $15. Dexilant is not covered , Aciphex was not covered, Pantoprazole is covered by insurance Verbal order was given to pharmacist .

## 2019-09-18 NOTE — Telephone Encounter (Signed)
Thank you for doing that.

## 2019-09-18 NOTE — Progress Notes (Signed)
Stop Janumet (cost), and sent RX for tradjenta.

## 2019-09-19 NOTE — Telephone Encounter (Signed)
OK thank u, let me know if he is ok with it, we can provide patient assistance as well

## 2019-10-21 ENCOUNTER — Telehealth: Payer: Self-pay

## 2019-10-21 NOTE — Telephone Encounter (Signed)
Confirmed appointment with patient. [phone]

## 2019-10-26 ENCOUNTER — Ambulatory Visit: Payer: BC Managed Care – PPO | Admitting: Adult Health

## 2019-10-26 ENCOUNTER — Encounter: Payer: Self-pay | Admitting: Adult Health

## 2019-10-26 ENCOUNTER — Other Ambulatory Visit: Payer: Self-pay

## 2019-10-26 VITALS — BP 142/90 | HR 87 | Temp 97.6°F | Resp 16 | Ht 67.0 in | Wt 193.0 lb

## 2019-10-26 DIAGNOSIS — D509 Iron deficiency anemia, unspecified: Secondary | ICD-10-CM

## 2019-10-26 DIAGNOSIS — E1165 Type 2 diabetes mellitus with hyperglycemia: Secondary | ICD-10-CM

## 2019-10-26 DIAGNOSIS — K219 Gastro-esophageal reflux disease without esophagitis: Secondary | ICD-10-CM

## 2019-10-26 DIAGNOSIS — I10 Essential (primary) hypertension: Secondary | ICD-10-CM

## 2019-10-26 DIAGNOSIS — Z0001 Encounter for general adult medical examination with abnormal findings: Secondary | ICD-10-CM | POA: Diagnosis not present

## 2019-10-26 MED ORDER — GLIMEPIRIDE 2 MG PO TABS: 2.0000 mg | ORAL_TABLET | Freq: Two times a day (BID) | ORAL | 1 refills | Status: DC

## 2019-10-26 NOTE — Progress Notes (Signed)
[facility] [place], [place] [address]  Internal MEDICINE  Office Visit Note  Patient Name: Ronnie Mejia  [id number]  [id number]  Date of Service: 10/26/2019  Chief Complaint  Patient presents with  . Diabetes  . Hypertension  . Gastroesophageal Reflux    HPI  Pt is here for follow up on HTN, GERD and DM. He has been taking janumet and farxiga since last visit.  He was not sure if he was suppose to continue to Amaryl, so he just started this back last week. He reports his sugars are usually 130-140 mg/dl in the mornings.  He reports he had an appt with hematology but due to covid it was canceled.  He needs to call and set up a new appt.    Current Medication: Outpatient Encounter Medications as of 10/26/2019  Medication Sig  . Accu-Chek FastClix Lancets MISC Use as directed twice daily diag E11.65  . dapagliflozin propanediol (FARXIGA) 10 MG TABS tablet Take 10 mg by mouth daily.  Marland Kitchen dexlansoprazole (DEXILANT) 60 MG capsule Take 1 capsule (60 mg total) by mouth daily.  Marland Kitchen glimepiride (AMARYL) 2 MG tablet Take 1 tablet (2 mg total) by mouth 2 (two) times daily.  Marland Kitchen glucose blood (ACCU-CHEK GUIDE) test strip 1 each by Other route 2 (two) times daily. Use as instructed twice a daily e11.65  . SitaGLIPtin-MetFORMIN HCl (JANUMET XR) [phone] MG TB24 Take 1 tablet by mouth daily at 12 noon.  . [DISCONTINUED] glimepiride (AMARYL) 2 MG tablet Take 1 tablet (2 mg total) by mouth 2 (two) times daily.  . [DISCONTINUED] linagliptin (TRADJENTA) 5 MG TABS tablet Take 1 tablet (5 mg total) by mouth daily.   No facility-administered encounter medications on file as of 10/26/2019.    Surgical History: Past Surgical History:  Procedure Laterality Date  . ESOPHAGOGASTRODUODENOSCOPY (EGD) WITH PROPOFOL N/A 02/28/2018   Procedure: ESOPHAGOGASTRODUODENOSCOPY (EGD) WITH PROPOFOL;  Surgeon: Carol Ada, MD;  Location: [facility];  Service: Endoscopy;  Laterality: N/A;  .  ESOPHAGOGASTRODUODENOSCOPY (EGD) WITH PROPOFOL N/A 04/18/2018   Procedure: ESOPHAGOGASTRODUODENOSCOPY (EGD) WITH PROPOFOL;  Surgeon: Carol Ada, MD;  Location: [facility];  Service: Endoscopy;  Laterality: N/A;  . HOT HEMOSTASIS N/A 02/28/2018   Procedure: HOT HEMOSTASIS (ARGON PLASMA COAGULATION/BICAP);  Surgeon: Carol Ada, MD;  Location: [facility];  Service: Endoscopy;  Laterality: N/A;  . HOT HEMOSTASIS N/A 04/18/2018   Procedure: HOT HEMOSTASIS (ARGON PLASMA COAGULATION/BICAP);  Surgeon: Carol Ada, MD;  Location: [facility];  Service: Endoscopy;  Laterality: N/A;  . NASAL SEPTOPLASTY W/ TURBINOPLASTY Bilateral 10/17/2015   Procedure: Nasal Septoplasty, Bilateral Partial Reduction Inferior Turbinates ;  Surgeon: Margaretha Sheffield, MD;  Location: [facility];  Service: ENT;  Laterality: Bilateral;  . SHOULDER SURGERY Right    ARTHROSCOPIC  . WISDOM TOOTH EXTRACTION      Medical History: Past Medical History:  Diagnosis Date  . Anemia   . Arthritis   . Complication of anesthesia    PT WAS SHEDULED TO HAVE IGSS AT [place] ON 10-06-15 AND AFTER INDUCTION PT ASPIRATED SCANT AMOUNT OF GASTRIC CONTENTS AND CASE WAS CANCELLED  . Deviated septum   . Diabetes mellitus ([place])    on metformin  . GERD (gastroesophageal reflux disease)   . Hoarseness of voice    SINCE BEING INTUBATED ON 10-06-15  . Hypertension    PT DENIES DURING 10-14-15 PHONE INTERVIEW   . Nasal congestion    LONG TERM OBSTRUCTION/ENLARGED TURBINATES  . Pancreatitis 05/2014  .  Pancreatitis   . PONV (postoperative nausea and vomiting)   . Sleep apnea     had surgery no longer uses cpap    Family History: Family History  Problem Relation Age of Onset  . Diabetes Mother   . Hypertension Mother   . Diabetes Father   . Cancer Other        paternal aunt    Social History   Socioeconomic History  . Marital status: Married    Spouse name: Not on file  . Number of children: Not on file  . Years of  education: Not on file  . Highest education level: Not on file  Occupational History  . Occupation: Sports administrator: Sports coach  Tobacco Use  . Smoking status: Never Smoker  . Smokeless tobacco: Never Used  Substance and Sexual Activity  . Alcohol use: Yes    Comment: occasional,   . Drug use: No  . Sexual activity: Yes  Other Topics Concern  . Not on file  Social History Narrative  . Not on file   Social Determinants of Health   Financial Resource Strain:   . Difficulty of Paying Living Expenses: Not on file  Food Insecurity:   . Worried About Programme researcher, broadcasting/film/video in the Last Year: Not on file  . Ran Out of Food in the Last Year: Not on file  Transportation Needs:   . Lack of Transportation (Medical): Not on file  . Lack of Transportation (Non-Medical): Not on file  Physical Activity:   . Days of Exercise per Week: Not on file  . Minutes of Exercise per Session: Not on file  Stress:   . Feeling of Stress : Not on file  Social Connections:   . Frequency of Communication with Friends and Family: Not on file  . Frequency of Social Gatherings with Friends and Family: Not on file  . Attends Religious Services: Not on file  . Active Member of Clubs or Organizations: Not on file  . Attends Banker Meetings: Not on file  . Marital Status: Not on file  Intimate Partner Violence:   . Fear of Current or Ex-Partner: Not on file  . Emotionally Abused: Not on file  . Physically Abused: Not on file  . Sexually Abused: Not on file      Review of Systems  Constitutional: Negative.  Negative for chills, fatigue and unexpected weight change.  HENT: Negative.  Negative for congestion, rhinorrhea, sneezing and sore throat.   Eyes: Negative for redness.  Respiratory: Negative.  Negative for cough, chest tightness and shortness of breath.   Cardiovascular: Negative.  Negative for chest pain and palpitations.  Gastrointestinal: Negative.  Negative for abdominal pain,  constipation, diarrhea, nausea and vomiting.  Endocrine: Negative.   Genitourinary: Negative.  Negative for dysuria and frequency.  Musculoskeletal: Negative.  Negative for arthralgias, back pain, joint swelling and neck pain.  Skin: Negative.  Negative for rash.  Allergic/Immunologic: Negative.   Neurological: Negative.  Negative for tremors and numbness.  Hematological: Negative for adenopathy. Does not bruise/bleed easily.  Psychiatric/Behavioral: Negative.  Negative for behavioral problems, sleep disturbance and suicidal ideas. The patient is not nervous/anxious.     Vital Signs: BP (!) 142/90   Pulse 87   Temp 97.6 F (36.4 C)   Resp 16   Ht 5\' 7"  (1.702 m)   Wt 193 lb (87.5 kg)   SpO2 95%   BMI 30.23 kg/m    Physical Exam Vitals  and nursing note reviewed.  Constitutional:      General: He is not in acute distress.    Appearance: He is well-developed. He is not diaphoretic.  HENT:     Head: Normocephalic and atraumatic.     Mouth/Throat:     Pharynx: No oropharyngeal exudate.  Eyes:     Pupils: Pupils are equal, round, and reactive to light.  Neck:     Thyroid: No thyromegaly.     Vascular: No JVD.     Trachea: No tracheal deviation.  Cardiovascular:     Rate and Rhythm: Normal rate and regular rhythm.     Heart sounds: Normal heart sounds. No murmur. No friction rub. No gallop.   Pulmonary:     Effort: Pulmonary effort is normal. No respiratory distress.     Breath sounds: Normal breath sounds. No wheezing or rales.  Chest:     Chest wall: No tenderness.  Abdominal:     Palpations: Abdomen is soft.     Tenderness: There is no abdominal tenderness. There is no guarding.  Musculoskeletal:        General: Normal range of motion.     Cervical back: Normal range of motion and neck supple.  Lymphadenopathy:     Cervical: No cervical adenopathy.  Skin:    General: Skin is warm and dry.  Neurological:     Mental Status: He is alert and oriented to person,  place, and time.     Cranial Nerves: No cranial nerve deficit.  Psychiatric:        Behavior: Behavior normal.        Thought Content: Thought content normal.        Judgment: Judgment normal.    Assessment/Plan: 1. Uncontrolled type 2 diabetes mellitus with hyperglycemia (HCC) Pt has not been taking Amaryl, he became confused about his instructions at the last visit.  He restarted it about a week ago.  He has been taking it once a day.  Will increase to twice daily.  Continue farxiga and janumet also.  Will follow up in one month for A1C check.    2. Gastroesophageal reflux disease without esophagitis Stable, continue pantoprazole.    3. Essential hypertension Borderline today 142/90, continue to monitor.   4. Microcytic anemia Call and make an appt to follow up with hematology as directed.  General Counseling: Frederik verbalizes understanding of the findings of todays visit and agrees with plan of treatment. I have discussed any further diagnostic evaluation that may be needed or ordered today. We also reviewed his medications today. he has been encouraged to call the office with any questions or concerns that should arise related to todays visit.    No orders of the defined types were placed in this encounter.   Meds ordered this encounter  Medications  . glimepiride (AMARYL) 2 MG tablet    Sig: Take 1 tablet (2 mg total) by mouth 2 (two) times daily.    Dispense:  180 tablet    Refill:  1    Time spent: 25 Minutes   This patient was seen by Blima LedgerAdam Nalayah Hitt AGNP-C in Collaboration with Dr Lyndon CodeFozia M Khan as a part of collaborative care agreement     Johnna AcostaAdam J. Vineet Kinney AGNP-C Internal medicine

## 2019-10-27 LAB — LIPID PANEL WITH LDL/HDL RATIO
Cholesterol, Total: 180 mg/dL (ref 100–199)
HDL: 31 mg/dL — ABNORMAL LOW (ref >39)
LDL Chol Calc (NIH): 115 mg/dL — ABNORMAL HIGH (ref 0–99)
LDL/HDL Ratio: 3.7 ratio — ABNORMAL HIGH (ref 0.0–3.6)
Triglycerides: 195 mg/dL — ABNORMAL HIGH (ref 0–149)
VLDL Cholesterol Cal: 34 mg/dL (ref 5–40)

## 2019-10-27 LAB — COMPREHENSIVE METABOLIC PANEL
ALT: 34 IU/L (ref 0–44)
AST: 30 IU/L (ref 0–40)
Albumin/Globulin Ratio: 1.4 (ref 1.2–2.2)
Albumin: 4.6 g/dL (ref 4.0–5.0)
Alkaline Phosphatase: 74 IU/L (ref 39–117)
BUN/Creatinine Ratio: 20 (ref 9–20)
BUN: 15 mg/dL (ref 6–24)
Bilirubin Total: 0.5 mg/dL (ref 0.0–1.2)
CO2: 23 mmol/L (ref 20–29)
Calcium: 9.2 mg/dL (ref 8.7–10.2)
Chloride: 102 mmol/L (ref 96–106)
Creatinine, Ser: 0.75 mg/dL — ABNORMAL LOW (ref 0.76–1.27)
GFR calc Af Amer: 127 mL/min/{1.73_m2} (ref >59)
GFR calc non Af Amer: 110 mL/min/{1.73_m2} (ref >59)
Globulin, Total: 3.3 g/dL (ref 1.5–4.5)
Glucose: 144 mg/dL — ABNORMAL HIGH (ref 65–99)
Potassium: 4.3 mmol/L (ref 3.5–5.2)
Sodium: 140 mmol/L (ref 134–144)
Total Protein: 7.9 g/dL (ref 6.0–8.5)

## 2019-10-27 LAB — CBC WITH DIFFERENTIAL/PLATELET
Basophils Absolute: 0 10*3/uL (ref 0.0–0.2)
Basos: 1 %
EOS (ABSOLUTE): 0.2 10*3/uL (ref 0.0–0.4)
Eos: 3 %
Hematocrit: 50.8 % (ref 37.5–51.0)
Hemoglobin: 17 g/dL (ref 13.0–17.7)
Immature Grans (Abs): 0.1 10*3/uL (ref 0.0–0.1)
Immature Granulocytes: 1 %
Lymphocytes Absolute: 2.8 10*3/uL (ref 0.7–3.1)
Lymphs: 55 %
MCH: 28.9 pg (ref 26.6–33.0)
MCHC: 33.5 g/dL (ref 31.5–35.7)
MCV: 86 fL (ref 79–97)
Monocytes Absolute: 0.3 10*3/uL (ref 0.1–0.9)
Monocytes: 7 %
Neutrophils Absolute: 1.6 10*3/uL (ref 1.4–7.0)
Neutrophils: 33 %
Platelets: 217 10*3/uL (ref 150–450)
RBC: 5.89 x10E6/uL — ABNORMAL HIGH (ref 4.14–5.80)
RDW: 13.2 % (ref 11.6–15.4)
WBC: 5 10*3/uL (ref 3.4–10.8)

## 2019-10-27 LAB — TSH: TSH: 1.9 u[IU]/mL (ref 0.450–4.500)

## 2019-10-27 LAB — T4, FREE: Free T4: 1.25 ng/dL (ref 0.82–1.77)

## 2019-10-27 LAB — PSA: Prostate Specific Ag, Serum: 0.5 ng/mL (ref 0.0–4.0)

## 2019-11-04 NOTE — Progress Notes (Signed)
RX plan

## 2019-11-12 ENCOUNTER — Other Ambulatory Visit: Payer: Self-pay

## 2019-11-12 ENCOUNTER — Inpatient Hospital Stay: Payer: BC Managed Care – PPO | Attending: Oncology

## 2019-11-12 ENCOUNTER — Other Ambulatory Visit: Payer: Self-pay | Admitting: *Deleted

## 2019-11-12 DIAGNOSIS — Z8249 Family history of ischemic heart disease and other diseases of the circulatory system: Secondary | ICD-10-CM | POA: Insufficient documentation

## 2019-11-12 DIAGNOSIS — D509 Iron deficiency anemia, unspecified: Secondary | ICD-10-CM | POA: Insufficient documentation

## 2019-11-12 DIAGNOSIS — Z809 Family history of malignant neoplasm, unspecified: Secondary | ICD-10-CM | POA: Diagnosis not present

## 2019-11-12 DIAGNOSIS — D709 Neutropenia, unspecified: Secondary | ICD-10-CM | POA: Diagnosis not present

## 2019-11-12 DIAGNOSIS — Z833 Family history of diabetes mellitus: Secondary | ICD-10-CM | POA: Insufficient documentation

## 2019-11-12 LAB — CBC WITH DIFFERENTIAL/PLATELET
Abs Immature Granulocytes: 0.05 10*3/uL (ref 0.00–0.07)
Basophils Absolute: 0 10*3/uL (ref 0.0–0.1)
Basophils Relative: 1 %
Eosinophils Absolute: 0.2 10*3/uL (ref 0.0–0.5)
Eosinophils Relative: 3 %
HCT: 48.1 % (ref 39.0–52.0)
Hemoglobin: 15.6 g/dL (ref 13.0–17.0)
Immature Granulocytes: 1 %
Lymphocytes Relative: 52 %
Lymphs Abs: 2.6 10*3/uL (ref 0.7–4.0)
MCH: 28.2 pg (ref 26.0–34.0)
MCHC: 32.4 g/dL (ref 30.0–36.0)
MCV: 87 fL (ref 80.0–100.0)
Monocytes Absolute: 0.3 10*3/uL (ref 0.1–1.0)
Monocytes Relative: 6 %
Neutro Abs: 1.8 10*3/uL (ref 1.7–7.7)
Neutrophils Relative %: 37 %
Platelets: 178 10*3/uL (ref 150–400)
RBC: 5.53 MIL/uL (ref 4.22–5.81)
RDW: 13.2 % (ref 11.5–15.5)
WBC: 5.1 10*3/uL (ref 4.0–10.5)
nRBC: 0 % (ref 0.0–0.2)

## 2019-11-12 LAB — HEPATITIS C ANTIBODY: HCV Ab: NONREACTIVE

## 2019-11-12 LAB — VITAMIN B12: Vitamin B-12: 630 pg/mL (ref 180–914)

## 2019-11-12 LAB — HIV ANTIBODY (ROUTINE TESTING W REFLEX): HIV Screen 4th Generation wRfx: NONREACTIVE

## 2019-11-12 LAB — FOLATE: Folate: 12.3 ng/mL (ref >5.9)

## 2019-11-16 ENCOUNTER — Inpatient Hospital Stay (HOSPITAL_BASED_OUTPATIENT_CLINIC_OR_DEPARTMENT_OTHER): Payer: BC Managed Care – PPO | Admitting: Oncology

## 2019-11-16 ENCOUNTER — Other Ambulatory Visit: Payer: Self-pay

## 2019-11-16 ENCOUNTER — Encounter: Payer: Self-pay | Admitting: Oncology

## 2019-11-16 ENCOUNTER — Inpatient Hospital Stay: Payer: BC Managed Care – PPO

## 2019-11-16 DIAGNOSIS — D509 Iron deficiency anemia, unspecified: Secondary | ICD-10-CM

## 2019-11-16 DIAGNOSIS — D709 Neutropenia, unspecified: Secondary | ICD-10-CM

## 2019-11-16 NOTE — Progress Notes (Signed)
Patient stated that he had been doing well with no concerns. 

## 2019-11-18 NOTE — Progress Notes (Signed)
I connected with Ronnie Mejia on 11/18/19 at  2:30 PM EST by video enabled telemedicine visit and verified that I am speaking with the correct person using two identifiers.   I discussed the limitations, risks, security and privacy concerns of performing an evaluation and management service by telemedicine and the availability of in-person appointments. I also discussed with the patient that there may be a patient responsible charge related to this service. The patient expressed understanding and agreed to proceed.  Other persons participating in the visit and their role in the encounter:  none  Patient's location:  work Provider's location:  work  Risk analyst Complaint:  Routine f/u of iron deficiency anemia and neutropenia  History of present illness: patient is a 49 year old male who has been referred to [place] for evaluation and management of anemia. Recent CBC from 02/16/2018 showed a white count of 3.5, H&H of 6.4/26.8 with an MCV of 63 and a platelet count of 253. He had an EGD in June 2019 which showed a 5 cm hiatal hernia and gastric antral vascular ectasia without bleeding.Subsequently patient has had 2 EGDs with GI and was reported to have some bleeding from his stomach which was cauterized. He also underwent a colonoscopy which did not reveal any evidence of bleeding. He was taking oral iron in the past but has not taken any iron since the last 3 months. Reports no dark melanotic stools or bright red blood in his stool. He occasionally takes Aleve for headache once a month. He reports problems with sexual function. Does not report any significant fatigue  Results of blood work from 06/27/2018 were as follows: CBC showed white count of 3.6, H&H of 8.1/28.7 with an MCV of 57.9 and a platelet count of 274. CMP was within normal limits. B12 and folate were normal. Ferritin levels were low at 8. Iron study showed a low iron saturation of 4%. TIBC was elevated at 575. Reticulocyte count was  inappropriately low for the degree of anemia. Haptoglobin was normal at 64 and TSH was normal.  Patient received 2 doses of Feraheme in August 2019   Interval history he is doing well overall. Denies any blood in stool or urine. Denies dark melanotic stools. No recent infections   Review of Systems  Constitutional: Negative for chills, fever, malaise/fatigue and weight loss.  HENT: Negative for congestion, ear discharge and nosebleeds.   Eyes: Negative for blurred vision.  Respiratory: Negative for cough, hemoptysis, sputum production, shortness of breath and wheezing.   Cardiovascular: Negative for chest pain, palpitations, orthopnea and claudication.  Gastrointestinal: Negative for abdominal pain, blood in stool, constipation, diarrhea, heartburn, melena, nausea and vomiting.  Genitourinary: Negative for dysuria, flank pain, frequency, hematuria and urgency.  Musculoskeletal: Negative for back pain, joint pain and myalgias.  Skin: Negative for rash.  Neurological: Negative for dizziness, tingling, focal weakness, seizures, weakness and headaches.  Endo/Heme/Allergies: Does not bruise/bleed easily.  Psychiatric/Behavioral: Negative for depression and suicidal ideas. The patient does not have insomnia.     No Known Allergies  Past Medical History:  Diagnosis Date  . Anemia   . Arthritis   . Complication of anesthesia    PT WAS SHEDULED TO HAVE IGSS AT [place] ON 10-06-15 AND AFTER INDUCTION PT ASPIRATED SCANT AMOUNT OF GASTRIC CONTENTS AND CASE WAS CANCELLED  . Deviated septum   . Diabetes mellitus ([place])    on metformin  . GERD (gastroesophageal reflux disease)   . Hoarseness of voice    SINCE BEING  INTUBATED ON 10-06-15  . Hypertension    PT DENIES DURING 10-14-15 PHONE INTERVIEW   . Nasal congestion    LONG TERM OBSTRUCTION/ENLARGED TURBINATES  . Pancreatitis 05/2014  . Pancreatitis   . PONV (postoperative nausea and vomiting)   . Sleep apnea     had surgery  no longer uses cpap    Past Surgical History:  Procedure Laterality Date  . ESOPHAGOGASTRODUODENOSCOPY (EGD) WITH PROPOFOL N/A 02/28/2018   Procedure: ESOPHAGOGASTRODUODENOSCOPY (EGD) WITH PROPOFOL;  Surgeon: Jeani Hawking, MD;  Location: [facility];  Service: Endoscopy;  Laterality: N/A;  . ESOPHAGOGASTRODUODENOSCOPY (EGD) WITH PROPOFOL N/A 04/18/2018   Procedure: ESOPHAGOGASTRODUODENOSCOPY (EGD) WITH PROPOFOL;  Surgeon: Jeani Hawking, MD;  Location: [facility];  Service: Endoscopy;  Laterality: N/A;  . HOT HEMOSTASIS N/A 02/28/2018   Procedure: HOT HEMOSTASIS (ARGON PLASMA COAGULATION/BICAP);  Surgeon: Jeani Hawking, MD;  Location: [facility];  Service: Endoscopy;  Laterality: N/A;  . HOT HEMOSTASIS N/A 04/18/2018   Procedure: HOT HEMOSTASIS (ARGON PLASMA COAGULATION/BICAP);  Surgeon: Jeani Hawking, MD;  Location: [facility];  Service: Endoscopy;  Laterality: N/A;  . NASAL SEPTOPLASTY W/ TURBINOPLASTY Bilateral 10/17/2015   Procedure: Nasal Septoplasty, Bilateral Partial Reduction Inferior Turbinates ;  Surgeon: Vernie Murders, MD;  Location: [facility];  Service: ENT;  Laterality: Bilateral;  . SHOULDER SURGERY Right    ARTHROSCOPIC  . WISDOM TOOTH EXTRACTION      Social History   Socioeconomic History  . Marital status: Married    Spouse name: Not on file  . Number of children: Not on file  . Years of education: Not on file  . Highest education level: Not on file  Occupational History  . Occupation: Sports administrator: Sports coach  Tobacco Use  . Smoking status: Never Smoker  . Smokeless tobacco: Never Used  Substance and Sexual Activity  . Alcohol use: Yes    Comment: occasional,   . Drug use: No  . Sexual activity: Yes  Other Topics Concern  . Not on file  Social History Narrative  . Not on file   Social Determinants of Health   Financial Resource Strain:   . Difficulty of Paying Living Expenses: Not on file  Food Insecurity:   . Worried About Programme researcher, broadcasting/film/video  in the Last Year: Not on file  . Ran Out of Food in the Last Year: Not on file  Transportation Needs:   . Lack of Transportation (Medical): Not on file  . Lack of Transportation (Non-Medical): Not on file  Physical Activity:   . Days of Exercise per Week: Not on file  . Minutes of Exercise per Session: Not on file  Stress:   . Feeling of Stress : Not on file  Social Connections:   . Frequency of Communication with Friends and Family: Not on file  . Frequency of Social Gatherings with Friends and Family: Not on file  . Attends Religious Services: Not on file  . Active Member of Clubs or Organizations: Not on file  . Attends Banker Meetings: Not on file  . Marital Status: Not on file  Intimate Partner Violence:   . Fear of Current or Ex-Partner: Not on file  . Emotionally Abused: Not on file  . Physically Abused: Not on file  . Sexually Abused: Not on file    Family History  Problem Relation Age of Onset  . Diabetes Mother   . Hypertension Mother   . Diabetes Father   . Cancer Other  paternal aunt     Current Outpatient Medications:  .  Accu-Chek FastClix Lancets MISC, Use as directed twice daily diag E11.65, Disp: 100 each, Rfl: 2 .  dapagliflozin propanediol (FARXIGA) 10 MG TABS tablet, Take 10 mg by mouth daily., Disp: 30 tablet, Rfl: 1 .  glimepiride (AMARYL) 2 MG tablet, Take 1 tablet (2 mg total) by mouth 2 (two) times daily., Disp: 180 tablet, Rfl: 1 .  glucose blood (ACCU-CHEK GUIDE) test strip, 1 each by Other route 2 (two) times daily. Use as instructed twice a daily e11.65, Disp: 100 each, Rfl: 3 .  pantoprazole (PROTONIX) 40 MG tablet, Take 40 mg by mouth daily., Disp: , Rfl:  .  SitaGLIPtin-MetFORMIN HCl (JANUMET XR) [phone] MG TB24, Take 1 tablet by mouth daily at 12 noon., Disp: 90 tablet, Rfl: 1  No results found.  No images are attached to the encounter.   CMP Latest Ref Rng & Units 10/26/2019  Glucose 65 - 99 mg/dL 811(W)  BUN 6 -  24 mg/dL 15  Creatinine 8.67 - 7.37 mg/dL 3.66(K)  Sodium 159 - 470 mmol/L 140  Potassium 3.5 - 5.2 mmol/L 4.3  Chloride 96 - 106 mmol/L 102  CO2 20 - 29 mmol/L 23  Calcium 8.7 - 10.2 mg/dL 9.2  Total Protein 6.0 - 8.5 g/dL 7.9  Total Bilirubin 0.0 - 1.2 mg/dL 0.5  Alkaline Phos 39 - 117 IU/L 74  AST 0 - 40 IU/L 30  ALT 0 - 44 IU/L 34   CBC Latest Ref Rng & Units 11/12/2019  WBC 4.0 - 10.5 K/uL 5.1  Hemoglobin 13.0 - 17.0 g/dL 76.1  Hematocrit 51.8 - 52.0 % 48.1  Platelets 150 - 400 K/uL 178     Observation/objective: appears in no acute distress over video visit today. Breathing is non labored  Assessment and plan: Patient is a 49 year old male with iron deficiency anemia here for routine f/u  1. Iron deficiency anemia- he has not required IV iron for over a year now. Iron studies are normal. Continue to monitor Q4 months  2. Neutropenia: unclear etiology- HIV, Hep C, b12 and folate normal. Neutropenia waxes and wanes but overall stable. Continue to monitor  Follow-up instructions:cbc with diff in 4 and 8 months. And I will see in 8 months  I discussed the assessment and treatment plan with the patient. The patient was provided an opportunity to ask questions and all were answered. The patient agreed with the plan and demonstrated an understanding of the instructions.   The patient was advised to call back or seek an in-person evaluation if the symptoms worsen or if the condition fails to improve as anticipated.   Visit Diagnosis: 1. Iron deficiency anemia, unspecified iron deficiency anemia type   2. Neutropenia, unspecified type (HCC)     Dr. Owens Shark, MD, MPH [facility] at [facility] Tel- [phone] 11/18/2019 8:55 AM

## 2019-11-19 ENCOUNTER — Telehealth: Payer: Self-pay

## 2019-11-19 NOTE — Telephone Encounter (Signed)
Confirmed virtual visit with patient. klh 

## 2019-11-19 NOTE — Telephone Encounter (Signed)
Confirmed appointment with patient and screened for covid. klh 

## 2019-11-23 ENCOUNTER — Other Ambulatory Visit: Payer: Self-pay

## 2019-11-23 ENCOUNTER — Encounter: Payer: Self-pay | Admitting: Adult Health

## 2019-11-23 ENCOUNTER — Ambulatory Visit: Payer: BC Managed Care – PPO | Admitting: Adult Health

## 2019-11-23 VITALS — BP 125/83 | HR 83 | Temp 97.7°F | Resp 16 | Ht 67.0 in | Wt 197.2 lb

## 2019-11-23 DIAGNOSIS — K219 Gastro-esophageal reflux disease without esophagitis: Secondary | ICD-10-CM | POA: Diagnosis not present

## 2019-11-23 DIAGNOSIS — I10 Essential (primary) hypertension: Secondary | ICD-10-CM

## 2019-11-23 DIAGNOSIS — D509 Iron deficiency anemia, unspecified: Secondary | ICD-10-CM

## 2019-11-23 DIAGNOSIS — E1165 Type 2 diabetes mellitus with hyperglycemia: Secondary | ICD-10-CM

## 2019-11-23 LAB — POCT GLYCOSYLATED HEMOGLOBIN (HGB A1C): Hemoglobin A1C: 6.8 % — AB (ref 4.0–5.6)

## 2019-11-23 MED ORDER — JANUMET XR 100-1000 MG PO TB24: 1.0000 | ORAL_TABLET | Freq: Every day | ORAL | 1 refills | Status: DC

## 2019-11-23 MED ORDER — PANTOPRAZOLE SODIUM 40 MG PO TBEC: 40.0000 mg | DELAYED_RELEASE_TABLET | Freq: Every day | ORAL | 1 refills | Status: DC

## 2019-11-23 MED ORDER — FARXIGA 10 MG PO TABS: 10.0000 mg | ORAL_TABLET | Freq: Every day | ORAL | 1 refills | Status: DC

## 2019-11-23 NOTE — Progress Notes (Signed)
[facility] [address] [place], [place] [address]  Internal MEDICINE  Office Visit Note  Patient Name: Ronnie Mejia  [id number]  [id number]  Date of Service: 11/23/2019  Chief Complaint  Patient presents with  . Diabetes    HPI  Pt is seen today for follow up on DM.  At our last visit his A1C was over 10, and there was some confusion about taking his Amaryl.  He is taking all of his DM medications as this time and is doing well with his diet.  He denies any exercise, he does walk a lot for his job. He denies any new complaints.    Current Medication: Outpatient Encounter Medications as of 11/23/2019  Medication Sig  . Accu-Chek FastClix Lancets MISC Use as directed twice daily diag E11.65  . dapagliflozin propanediol (FARXIGA) 10 MG TABS tablet Take 10 mg by mouth daily.  Marland Kitchen glimepiride (AMARYL) 2 MG tablet Take 1 tablet (2 mg total) by mouth 2 (two) times daily.  Marland Kitchen glucose blood (ACCU-CHEK GUIDE) test strip 1 each by Other route 2 (two) times daily. Use as instructed twice a daily e11.65  . pantoprazole (PROTONIX) 40 MG tablet Take 40 mg by mouth daily.  . SitaGLIPtin-MetFORMIN HCl (JANUMET XR) [phone] MG TB24 Take 1 tablet by mouth daily at 12 noon.   No facility-administered encounter medications on file as of 11/23/2019.    Surgical History: Past Surgical History:  Procedure Laterality Date  . ESOPHAGOGASTRODUODENOSCOPY (EGD) WITH PROPOFOL N/A 02/28/2018   Procedure: ESOPHAGOGASTRODUODENOSCOPY (EGD) WITH PROPOFOL;  Surgeon: Jeani Hawking, MD;  Location: [facility];  Service: Endoscopy;  Laterality: N/A;  . ESOPHAGOGASTRODUODENOSCOPY (EGD) WITH PROPOFOL N/A 04/18/2018   Procedure: ESOPHAGOGASTRODUODENOSCOPY (EGD) WITH PROPOFOL;  Surgeon: Jeani Hawking, MD;  Location: [facility];  Service: Endoscopy;  Laterality: N/A;  . HOT HEMOSTASIS N/A 02/28/2018   Procedure: HOT HEMOSTASIS (ARGON PLASMA COAGULATION/BICAP);  Surgeon: Jeani Hawking, MD;  Location: [facility];  Service: Endoscopy;  Laterality: N/A;  . HOT HEMOSTASIS N/A 04/18/2018   Procedure: HOT HEMOSTASIS (ARGON PLASMA COAGULATION/BICAP);  Surgeon: Jeani Hawking, MD;  Location: [facility];  Service: Endoscopy;  Laterality: N/A;  . NASAL SEPTOPLASTY W/ TURBINOPLASTY Bilateral 10/17/2015   Procedure: Nasal Septoplasty, Bilateral Partial Reduction Inferior Turbinates ;  Surgeon: Vernie Murders, MD;  Location: [facility];  Service: ENT;  Laterality: Bilateral;  . SHOULDER SURGERY Right    ARTHROSCOPIC  . WISDOM TOOTH EXTRACTION      Medical History: Past Medical History:  Diagnosis Date  . Anemia   . Arthritis   . Complication of anesthesia    PT WAS SHEDULED TO HAVE IGSS AT [facility] ON 10-06-15 AND AFTER INDUCTION PT ASPIRATED SCANT AMOUNT OF GASTRIC CONTENTS AND CASE WAS CANCELLED  . Deviated septum   . Diabetes mellitus (HCC)    on metformin  . GERD (gastroesophageal reflux disease)   . Hoarseness of voice    SINCE BEING INTUBATED ON 10-06-15  . Hypertension    PT DENIES DURING 10-14-15 PHONE INTERVIEW   . Nasal congestion    LONG TERM OBSTRUCTION/ENLARGED TURBINATES  . Pancreatitis 05/2014  . Pancreatitis   . PONV (postoperative nausea and vomiting)   . Sleep apnea     had surgery no longer uses cpap    Family History: Family History  Problem Relation Age of Onset  . Diabetes Mother   . Hypertension Mother   . Diabetes Father   . Cancer Other  paternal aunt    Social History   Socioeconomic History  . Marital status: Married    Spouse name: Not on file  . Number of children: Not on file  . Years of education: Not on file  . Highest education level: Not on file  Occupational History  . Occupation: Sports administrator: Sports coach  Tobacco Use  . Smoking status: Never Smoker  . Smokeless tobacco: Never Used  Substance and Sexual Activity  . Alcohol use: Yes    Comment: occasional  . Drug use: No  . Sexual activity: Yes  Other Topics  Concern  . Not on file  Social History Narrative  . Not on file   Social Determinants of Health   Financial Resource Strain:   . Difficulty of Paying Living Expenses: Not on file  Food Insecurity:   . Worried About Programme researcher, broadcasting/film/video in the Last Year: Not on file  . Ran Out of Food in the Last Year: Not on file  Transportation Needs:   . Lack of Transportation (Medical): Not on file  . Lack of Transportation (Non-Medical): Not on file  Physical Activity:   . Days of Exercise per Week: Not on file  . Minutes of Exercise per Session: Not on file  Stress:   . Feeling of Stress : Not on file  Social Connections:   . Frequency of Communication with Friends and Family: Not on file  . Frequency of Social Gatherings with Friends and Family: Not on file  . Attends Religious Services: Not on file  . Active Member of Clubs or Organizations: Not on file  . Attends Banker Meetings: Not on file  . Marital Status: Not on file  Intimate Partner Violence:   . Fear of Current or Ex-Partner: Not on file  . Emotionally Abused: Not on file  . Physically Abused: Not on file  . Sexually Abused: Not on file      Review of Systems  Constitutional: Negative.  Negative for chills, fatigue and unexpected weight change.  HENT: Negative.  Negative for congestion, rhinorrhea, sneezing and sore throat.   Eyes: Negative for redness.  Respiratory: Negative.  Negative for cough, chest tightness and shortness of breath.   Cardiovascular: Negative.  Negative for chest pain and palpitations.  Gastrointestinal: Negative.  Negative for abdominal pain, constipation, diarrhea, nausea and vomiting.  Endocrine: Negative.   Genitourinary: Negative.  Negative for dysuria and frequency.  Musculoskeletal: Negative.  Negative for arthralgias, back pain, joint swelling and neck pain.  Skin: Negative.  Negative for rash.  Allergic/Immunologic: Negative.   Neurological: Negative.  Negative for tremors and  numbness.  Hematological: Negative for adenopathy. Does not bruise/bleed easily.  Psychiatric/Behavioral: Negative.  Negative for behavioral problems, sleep disturbance and suicidal ideas. The patient is not nervous/anxious.     Vital Signs: BP 125/83   Pulse 83   Temp 97.7 F (36.5 C)   Resp 16   Ht 5\' 7"  (1.702 m)   Wt 197 lb 3.2 oz (89.4 kg)   SpO2 96%   BMI 30.89 kg/m    Physical Exam Vitals and nursing note reviewed.  Constitutional:      General: He is not in acute distress.    Appearance: He is well-developed. He is not diaphoretic.  HENT:     Head: Normocephalic and atraumatic.     Mouth/Throat:     Pharynx: No oropharyngeal exudate.  Eyes:     Pupils: Pupils are equal, round,  and reactive to light.  Neck:     Thyroid: No thyromegaly.     Vascular: No JVD.     Trachea: No tracheal deviation.  Cardiovascular:     Rate and Rhythm: Normal rate and regular rhythm.     Heart sounds: Normal heart sounds. No murmur. No friction rub. No gallop.   Pulmonary:     Effort: Pulmonary effort is normal. No respiratory distress.     Breath sounds: Normal breath sounds. No wheezing or rales.  Chest:     Chest wall: No tenderness.  Abdominal:     Palpations: Abdomen is soft.     Tenderness: There is no abdominal tenderness. There is no guarding.  Musculoskeletal:        General: Normal range of motion.     Cervical back: Normal range of motion and neck supple.  Lymphadenopathy:     Cervical: No cervical adenopathy.  Skin:    General: Skin is warm and dry.  Neurological:     Mental Status: He is alert and oriented to person, place, and time.     Cranial Nerves: No cranial nerve deficit.  Psychiatric:        Behavior: Behavior normal.        Thought Content: Thought content normal.        Judgment: Judgment normal.     Assessment/Plan: 1. Type 2 diabetes mellitus with hyperglycemia, unspecified whether long term insulin use (HCC) A1C improved to 6.8, once again  discussed dietary management, and importance of medication compliance.  Pt is happy.  - POCT HgB A1C - SitaGLIPtin-MetFORMIN HCl (JANUMET XR) [phone] MG TB24; Take 1 tablet by mouth daily at 12 noon.  Dispense: 90 tablet; Refill: 1 - dapagliflozin propanediol (FARXIGA) 10 MG TABS tablet; Take 10 mg by mouth daily.  Dispense: 90 tablet; Refill: 1  2. Essential hypertension Stable, continue present management.   3. Gastroesophageal reflux disease without esophagitis Stable, continue Protonix as discussed.  - pantoprazole (PROTONIX) 40 MG tablet; Take 1 tablet (40 mg total) by mouth daily.  Dispense: 90 tablet; Refill: 1  4. Microcytic anemia Follow up with hematology as directed.   General Counseling: Tyreak verbalizes understanding of the findings of todays visit and agrees with plan of treatment. I have discussed any further diagnostic evaluation that may be needed or ordered today. We also reviewed his medications today. he has been encouraged to call the office with any questions or concerns that should arise related to todays visit.    Orders Placed This Encounter  Procedures  . POCT HgB A1C    No orders of the defined types were placed in this encounter.   Time spent: 25 Minutes   This patient was seen by Orson Gear AGNP-C in Collaboration with Dr Lavera Guise as a part of collaborative care agreement     Kendell Bane AGNP-C Internal medicine

## 2020-02-18 ENCOUNTER — Telehealth: Payer: Self-pay

## 2020-02-18 NOTE — Telephone Encounter (Signed)
Confirmed appointment on 02/22/2020 and screened for covid. klh 

## 2020-02-22 ENCOUNTER — Other Ambulatory Visit: Payer: Self-pay

## 2020-02-22 ENCOUNTER — Ambulatory Visit: Payer: BC Managed Care – PPO | Admitting: Adult Health

## 2020-02-22 ENCOUNTER — Encounter: Payer: Self-pay | Admitting: Adult Health

## 2020-02-22 VITALS — BP 138/80 | HR 86 | Temp 97.8°F | Resp 16 | Ht 67.0 in | Wt 194.0 lb

## 2020-02-22 DIAGNOSIS — E1165 Type 2 diabetes mellitus with hyperglycemia: Secondary | ICD-10-CM

## 2020-02-22 DIAGNOSIS — N521 Erectile dysfunction due to diseases classified elsewhere: Secondary | ICD-10-CM

## 2020-02-22 DIAGNOSIS — I10 Essential (primary) hypertension: Secondary | ICD-10-CM

## 2020-02-22 DIAGNOSIS — K219 Gastro-esophageal reflux disease without esophagitis: Secondary | ICD-10-CM | POA: Diagnosis not present

## 2020-02-22 LAB — POCT GLYCOSYLATED HEMOGLOBIN (HGB A1C): Hemoglobin A1C: 6.4 % — AB (ref 4.0–5.6)

## 2020-02-22 NOTE — Progress Notes (Signed)
[facility] [address] [place], [place] [address]  Internal MEDICINE  Office Visit Note  Patient Name: Ronnie Mejia  [id number]  [id number]  Date of Service: 02/22/2020  Chief Complaint  Patient presents with  . Follow-up  . Diabetes  . Gastroesophageal Reflux  . Hypertension    HPI  Pt is here for follow up on DM, GERD, and HTN.  His bp was intially borderline with a diastolic 90, however at recheck was down to 138/80.  He Denies Chest pain, Shortness of breath, palpitations, headache, or blurred vision. His A1C is 6.4, which is imporved from 6.8.  He is taking janumet, farxiga and amaryl.       Current Medication: Outpatient Encounter Medications as of 02/22/2020  Medication Sig  . Accu-Chek FastClix Lancets MISC Use as directed twice daily diag E11.65  . dapagliflozin propanediol (FARXIGA) 10 MG TABS tablet Take 10 mg by mouth daily.  Marland Kitchen glimepiride (AMARYL) 2 MG tablet Take 1 tablet (2 mg total) by mouth 2 (two) times daily.  Marland Kitchen glucose blood (ACCU-CHEK GUIDE) test strip 1 each by Other route 2 (two) times daily. Use as instructed twice a daily e11.65  . pantoprazole (PROTONIX) 40 MG tablet Take 1 tablet (40 mg total) by mouth daily.  . SitaGLIPtin-MetFORMIN HCl (JANUMET XR) [phone] MG TB24 Take 1 tablet by mouth daily at 12 noon.   No facility-administered encounter medications on file as of 02/22/2020.    Surgical History: Past Surgical History:  Procedure Laterality Date  . ESOPHAGOGASTRODUODENOSCOPY (EGD) WITH PROPOFOL N/A 02/28/2018   Procedure: ESOPHAGOGASTRODUODENOSCOPY (EGD) WITH PROPOFOL;  Surgeon: Jeani Hawking, MD;  Location: [facility];  Service: Endoscopy;  Laterality: N/A;  . ESOPHAGOGASTRODUODENOSCOPY (EGD) WITH PROPOFOL N/A 04/18/2018   Procedure: ESOPHAGOGASTRODUODENOSCOPY (EGD) WITH PROPOFOL;  Surgeon: Jeani Hawking, MD;  Location: [facility];  Service: Endoscopy;  Laterality: N/A;  . HOT HEMOSTASIS N/A 02/28/2018   Procedure: HOT  HEMOSTASIS (ARGON PLASMA COAGULATION/BICAP);  Surgeon: Jeani Hawking, MD;  Location: [facility];  Service: Endoscopy;  Laterality: N/A;  . HOT HEMOSTASIS N/A 04/18/2018   Procedure: HOT HEMOSTASIS (ARGON PLASMA COAGULATION/BICAP);  Surgeon: Jeani Hawking, MD;  Location: [facility];  Service: Endoscopy;  Laterality: N/A;  . NASAL SEPTOPLASTY W/ TURBINOPLASTY Bilateral 10/17/2015   Procedure: Nasal Septoplasty, Bilateral Partial Reduction Inferior Turbinates ;  Surgeon: Vernie Murders, MD;  Location: [facility];  Service: ENT;  Laterality: Bilateral;  . SHOULDER SURGERY Right    ARTHROSCOPIC  . WISDOM TOOTH EXTRACTION      Medical History: Past Medical History:  Diagnosis Date  . Anemia   . Arthritis   . Complication of anesthesia    PT WAS SHEDULED TO HAVE IGSS AT [facility] ON 10-06-15 AND AFTER INDUCTION PT ASPIRATED SCANT AMOUNT OF GASTRIC CONTENTS AND CASE WAS CANCELLED  . Deviated septum   . Diabetes mellitus (HCC)    on metformin  . GERD (gastroesophageal reflux disease)   . Hoarseness of voice    SINCE BEING INTUBATED ON 10-06-15  . Hypertension    PT DENIES DURING 10-14-15 PHONE INTERVIEW   . Nasal congestion    LONG TERM OBSTRUCTION/ENLARGED TURBINATES  . Pancreatitis 05/2014  . Pancreatitis   . PONV (postoperative nausea and vomiting)   . Sleep apnea     had surgery no longer uses cpap    Family History: Family History  Problem Relation Age of Onset  . Diabetes Mother   . Hypertension Mother   . Diabetes Father   .  Cancer Other        paternal aunt    Social History   Socioeconomic History  . Marital status: Married    Spouse name: Not on file  . Number of children: Not on file  . Years of education: Not on file  . Highest education level: Not on file  Occupational History  . Occupation: Sports administrator: Sports coach  Tobacco Use  . Smoking status: Never Smoker  . Smokeless tobacco: Never Used  Substance and Sexual Activity  . Alcohol use:  Yes    Comment: occasional  . Drug use: No  . Sexual activity: Yes  Other Topics Concern  . Not on file  Social History Narrative  . Not on file   Social Determinants of Health   Financial Resource Strain:   . Difficulty of Paying Living Expenses:   Food Insecurity:   . Worried About Programme researcher, broadcasting/film/video in the Last Year:   . Barista in the Last Year:   Transportation Needs:   . Freight forwarder (Medical):   Marland Kitchen Lack of Transportation (Non-Medical):   Physical Activity:   . Days of Exercise per Week:   . Minutes of Exercise per Session:   Stress:   . Feeling of Stress :   Social Connections:   . Frequency of Communication with Friends and Family:   . Frequency of Social Gatherings with Friends and Family:   . Attends Religious Services:   . Active Member of Clubs or Organizations:   . Attends Banker Meetings:   Marland Kitchen Marital Status:   Intimate Partner Violence:   . Fear of Current or Ex-Partner:   . Emotionally Abused:   Marland Kitchen Physically Abused:   . Sexually Abused:       Review of Systems  Constitutional: Negative.  Negative for chills, fatigue and unexpected weight change.  HENT: Negative.  Negative for congestion, rhinorrhea, sneezing and sore throat.   Eyes: Negative for redness.  Respiratory: Negative.  Negative for cough, chest tightness and shortness of breath.   Cardiovascular: Negative.  Negative for chest pain and palpitations.  Gastrointestinal: Negative.  Negative for abdominal pain, constipation, diarrhea, nausea and vomiting.  Endocrine: Negative.   Genitourinary: Negative.  Negative for dysuria and frequency.  Musculoskeletal: Negative.  Negative for arthralgias, back pain, joint swelling and neck pain.  Skin: Negative.  Negative for rash.  Allergic/Immunologic: Negative.   Neurological: Negative.  Negative for tremors and numbness.  Hematological: Negative for adenopathy. Does not bruise/bleed easily.  Psychiatric/Behavioral:  Negative.  Negative for behavioral problems, sleep disturbance and suicidal ideas. The patient is not nervous/anxious.     Vital Signs: BP 133/90   Pulse 86   Temp 97.8 F (36.6 C)   Resp 16   Ht 5\' 7"  (1.702 m)   Wt 194 lb (88 kg)   SpO2 96%   BMI 30.38 kg/m    Physical Exam Vitals and nursing note reviewed.  Constitutional:      General: He is not in acute distress.    Appearance: He is well-developed. He is not diaphoretic.  HENT:     Head: Normocephalic and atraumatic.     Mouth/Throat:     Pharynx: No oropharyngeal exudate.  Eyes:     Pupils: Pupils are equal, round, and reactive to light.  Neck:     Thyroid: No thyromegaly.     Vascular: No JVD.     Trachea: No tracheal deviation.  Cardiovascular:     Rate and Rhythm: Normal rate and regular rhythm.     Heart sounds: Normal heart sounds. No murmur. No friction rub. No gallop.   Pulmonary:     Effort: Pulmonary effort is normal. No respiratory distress.     Breath sounds: Normal breath sounds. No wheezing or rales.  Chest:     Chest wall: No tenderness.  Abdominal:     Palpations: Abdomen is soft.     Tenderness: There is no abdominal tenderness. There is no guarding.  Musculoskeletal:        General: Normal range of motion.     Cervical back: Normal range of motion and neck supple.  Lymphadenopathy:     Cervical: No cervical adenopathy.  Skin:    General: Skin is warm and dry.  Neurological:     Mental Status: He is alert and oriented to person, place, and time.     Cranial Nerves: No cranial nerve deficit.  Psychiatric:        Behavior: Behavior normal.        Thought Content: Thought content normal.        Judgment: Judgment normal.    Assessment/Plan: 1. Uncontrolled type 2 diabetes mellitus with hyperglycemia (HCC) A1C 6.4, once again encouraged lifestyle modifications and dietary changes.  Continue medications as ordered.  - POCT HgB A1C  2. Essential hypertension Stable, continue present  management.   3. Gastroesophageal reflux disease without esophagitis No new symptoms or issues.  Continue as directed.   4. Erectile dysfunction due to diseases classified elsewhere Pt requesting eval from urology.  Sent at this time.  - Ambulatory referral to Urology  General Counseling: Roey verbalizes understanding of the findings of todays visit and agrees with plan of treatment. I have discussed any further diagnostic evaluation that may be needed or ordered today. We also reviewed his medications today. he has been encouraged to call the office with any questions or concerns that should arise related to todays visit.    Orders Placed This Encounter  Procedures  . POCT HgB A1C    No orders of the defined types were placed in this encounter.   Time spent: 30  Minutes   This patient was seen by Orson Gear AGNP-C in Collaboration with Dr Lavera Guise as a part of collaborative care agreement     Kendell Bane AGNP-C Internal medicine

## 2020-03-15 ENCOUNTER — Other Ambulatory Visit: Payer: Self-pay

## 2020-03-15 ENCOUNTER — Inpatient Hospital Stay: Payer: BC Managed Care – PPO | Attending: Oncology

## 2020-03-15 DIAGNOSIS — D509 Iron deficiency anemia, unspecified: Secondary | ICD-10-CM | POA: Insufficient documentation

## 2020-03-15 DIAGNOSIS — D709 Neutropenia, unspecified: Secondary | ICD-10-CM

## 2020-03-15 LAB — CBC WITH DIFFERENTIAL/PLATELET
Abs Immature Granulocytes: 0.08 10*3/uL — ABNORMAL HIGH (ref 0.00–0.07)
Basophils Absolute: 0 10*3/uL (ref 0.0–0.1)
Basophils Relative: 0 %
Eosinophils Absolute: 0.1 10*3/uL (ref 0.0–0.5)
Eosinophils Relative: 1 %
HCT: 46.8 % (ref 39.0–52.0)
Hemoglobin: 16.1 g/dL (ref 13.0–17.0)
Immature Granulocytes: 2 %
Lymphocytes Relative: 51 %
Lymphs Abs: 2.5 10*3/uL (ref 0.7–4.0)
MCH: 28.8 pg (ref 26.0–34.0)
MCHC: 34.4 g/dL (ref 30.0–36.0)
MCV: 83.7 fL (ref 80.0–100.0)
Monocytes Absolute: 0.3 10*3/uL (ref 0.1–1.0)
Monocytes Relative: 6 %
Neutro Abs: 1.9 10*3/uL (ref 1.7–7.7)
Neutrophils Relative %: 40 %
Platelets: 189 10*3/uL (ref 150–400)
RBC: 5.59 MIL/uL (ref 4.22–5.81)
RDW: 13.9 % (ref 11.5–15.5)
WBC: 4.9 10*3/uL (ref 4.0–10.5)
nRBC: 0 % (ref 0.0–0.2)

## 2020-03-15 LAB — FERRITIN: Ferritin: 70 ng/mL (ref 24–336)

## 2020-03-15 LAB — IRON AND TIBC
Iron: 87 ug/dL (ref 45–182)
Saturation Ratios: 21 % (ref 17.9–39.5)
TIBC: 417 ug/dL (ref 250–450)
UIBC: 330 ug/dL

## 2020-03-21 NOTE — Progress Notes (Signed)
03/22/20 11:17 AM   Ronnie Mejia 1971-07-28 [id number]  Referring provider: Kendell Bane, NP [place],  [place] [address] Chief Complaint  Patient presents with  . Erectile Dysfunction    HPI: Ronnie Mejia is a 49 y.o. M who presents today for the evaluation and management of ED.   Risk factors for ED include diabetes, HTN, and sleep apnea.   He reports of difficulties w/ achieving and maintaining erections. He notes of gradual loss of morning erections over time. He has been using Trimix 1 mL syringe started by Dr. Gloriann Loan 2 years ago initially with good effect but this has waned.Marland Kitchen   He states of no symptom relief w/ PDE-5 inhibitors.  SHIM    Row Name 03/22/20 1328         SHIM: Over the last 6 months:   How do you rate your confidence that you could get and keep an erection?  Very Low     When you had erections with sexual stimulation, how often were your erections hard enough for penetration (entering your partner)?  A Few Times (much less than half the time)     During sexual intercourse, how often were you able to maintain your erection after you had penetrated (entered) your partner?  A Few Times (much less than half the time)     During sexual intercourse, how difficult was it to maintain your erection to completion of intercourse?  Extremely Difficult     When you attempted sexual intercourse, how often was it satisfactory for you?  Almost Never or Never       SHIM Total Score   SHIM  7       PMH: Past Medical History:  Diagnosis Date  . Anemia   . Arthritis   . Complication of anesthesia    PT WAS SHEDULED TO HAVE IGSS AT [place] ON 10-06-15 AND AFTER INDUCTION PT ASPIRATED SCANT AMOUNT OF GASTRIC CONTENTS AND CASE WAS CANCELLED  . Deviated septum   . Diabetes mellitus ([place])    on metformin  . GERD (gastroesophageal reflux disease)   . Hoarseness of voice    SINCE BEING INTUBATED ON 10-06-15  . Hypertension    PT DENIES DURING  10-14-15 PHONE INTERVIEW   . Nasal congestion    LONG TERM OBSTRUCTION/ENLARGED TURBINATES  . Pancreatitis 05/2014  . Pancreatitis   . PONV (postoperative nausea and vomiting)   . Sleep apnea     had surgery no longer uses cpap    Surgical History: Past Surgical History:  Procedure Laterality Date  . ESOPHAGOGASTRODUODENOSCOPY (EGD) WITH PROPOFOL N/A 02/28/2018   Procedure: ESOPHAGOGASTRODUODENOSCOPY (EGD) WITH PROPOFOL;  Surgeon: Modine Oppenheimer Ada, MD;  Location: [facility];  Service: Endoscopy;  Laterality: N/A;  . ESOPHAGOGASTRODUODENOSCOPY (EGD) WITH PROPOFOL N/A 04/18/2018   Procedure: ESOPHAGOGASTRODUODENOSCOPY (EGD) WITH PROPOFOL;  Surgeon: Lavetta Geier Ada, MD;  Location: [facility];  Service: Endoscopy;  Laterality: N/A;  . HOT HEMOSTASIS N/A 02/28/2018   Procedure: HOT HEMOSTASIS (ARGON PLASMA COAGULATION/BICAP);  Surgeon: Prim Morace Ada, MD;  Location: [facility];  Service: Endoscopy;  Laterality: N/A;  . HOT HEMOSTASIS N/A 04/18/2018   Procedure: HOT HEMOSTASIS (ARGON PLASMA COAGULATION/BICAP);  Surgeon: Breella Vanostrand Ada, MD;  Location: [facility];  Service: Endoscopy;  Laterality: N/A;  . NASAL SEPTOPLASTY W/ TURBINOPLASTY Bilateral 10/17/2015   Procedure: Nasal Septoplasty, Bilateral Partial Reduction Inferior Turbinates ;  Surgeon: Margaretha Sheffield, MD;  Location: [facility];  Service: ENT;  Laterality: Bilateral;  .  SHOULDER SURGERY Right    ARTHROSCOPIC  . WISDOM TOOTH EXTRACTION      Home Medications:  Allergies as of 03/22/2020   No Known Allergies     Medication List       Accurate as of Mar 22, 2020 11:59 PM. If you have any questions, ask your nurse or doctor.        Accu-Chek FastClix Lancets Misc Use as directed twice daily diag E11.65   Accu-Chek Guide test strip Generic drug: glucose blood 1 each by Other route 2 (two) times daily. Use as instructed twice a daily e11.65   Farxiga 10 MG Tabs tablet Generic drug: dapagliflozin propanediol Take 10 mg by mouth  daily.   glimepiride 2 MG tablet Commonly known as: AMARYL Take 1 tablet (2 mg total) by mouth 2 (two) times daily.   Janumet XR [id number] MG Tb24 Generic drug: SitaGLIPtin-MetFORMIN HCl Take 1 tablet by mouth daily at 12 noon.   NONFORMULARY OR COMPOUNDED ITEM Super Trimix (30/2/20)-(Pap/Phent/PGE)  Dosage: Inject 0.5  Cc to titrate up per injection   Vial 34ml  Qty #10 Refills 6  [facility] [phone] Fax [phone] Started by: Vanna Scotland, MD   pantoprazole 40 MG tablet Commonly known as: PROTONIX Take 1 tablet (40 mg total) by mouth daily.   sildenafil 20 MG tablet Commonly known as: Revatio Take 1 tablet (20 mg total) by mouth as needed. Take 1-5 tabs as needed prior to intercourse Started by: Vanna Scotland, MD       Allergies: No Known Allergies  Family History: Family History  Problem Relation Age of Onset  . Diabetes Mother   . Hypertension Mother   . Diabetes Father   . Cancer Other        paternal aunt    Social History:  reports that he has never smoked. He has never used smokeless tobacco. He reports current alcohol use. He reports that he does not use drugs.   Physical Exam: BP 136/80   Pulse 90   Ht 5\' 7"  (1.702 m)   Wt 190 lb (86.2 kg)   BMI 29.76 kg/m   Constitutional:  Alert and oriented, No acute distress. HEENT: [place] AT, moist mucus membranes.  Trachea midline, no masses. Cardiovascular: No clubbing, cyanosis, or edema. Respiratory: Normal respiratory effort, no increased work of breathing. Skin: No rashes, bruises or suspicious lesions. Neurologic: Grossly intact, no focal deficits, moving all 4 extremities. Psychiatric: Normal mood and affect.  Laboratory Data:  Lab Results  Component Value Date   HGBA1C 6.4 (A) 02/22/2020   Assessment & Plan:    1. Erectile dysfunction We discussed the pathophysiology of erectile dysfunction today along with possible congestive any factors. Discussed possible treatment  options including PDE 5 inhibitors, vacuum erectile device, intracavernosal injection, MUSE, and placement of the inflatable or malleable penile prosthesis for refractory cases.  No relief w/ PDE-5 inhibitors or Trimix injections  Not interested in prothesis at this time  1 mL of Trimix ineffective so recommended to start w/ 0.5 mL of SuperTrimix and titrate up as needed.   Return if symptoms worsen or fail to improve.  [facility] [address] [place], [place] [phone]  I, [phone], am acting as a scribe for Dr. Donne Hazel,  I have reviewed the above documentation for accuracy and completeness, and I agree with the above.   Vanna Scotland, MD

## 2020-03-22 ENCOUNTER — Ambulatory Visit: Payer: BC Managed Care – PPO | Admitting: Urology

## 2020-03-22 ENCOUNTER — Other Ambulatory Visit: Payer: Self-pay

## 2020-03-22 VITALS — BP 136/80 | HR 90 | Ht 67.0 in | Wt 190.0 lb

## 2020-03-22 DIAGNOSIS — N5203 Combined arterial insufficiency and corporo-venous occlusive erectile dysfunction: Secondary | ICD-10-CM

## 2020-03-22 MED ORDER — SILDENAFIL CITRATE 20 MG PO TABS: 20.0000 mg | ORAL_TABLET | ORAL | 2 refills | Status: DC | PRN

## 2020-03-22 MED ORDER — NONFORMULARY OR COMPOUNDED ITEM: 6 refills | Status: DC

## 2020-03-25 ENCOUNTER — Other Ambulatory Visit: Payer: Self-pay | Admitting: *Deleted

## 2020-03-25 MED ORDER — SILDENAFIL CITRATE 20 MG PO TABS: 20.0000 mg | ORAL_TABLET | ORAL | 2 refills | Status: DC | PRN

## 2020-03-25 NOTE — Progress Notes (Signed)
Received PA denial from [facility], patient notified. Script sent to [employer]-texted [employer] coupon. Patient voiced understanding.

## 2020-04-30 ENCOUNTER — Other Ambulatory Visit: Payer: Self-pay | Admitting: Adult Health

## 2020-05-12 ENCOUNTER — Telehealth: Payer: Self-pay

## 2020-05-12 NOTE — Telephone Encounter (Signed)
Confirmed appointment on 05/16/2020 and screened for covid. klh  

## 2020-05-16 ENCOUNTER — Encounter: Payer: Self-pay | Admitting: Adult Health

## 2020-05-16 ENCOUNTER — Ambulatory Visit (INDEPENDENT_AMBULATORY_CARE_PROVIDER_SITE_OTHER): Payer: BC Managed Care – PPO | Admitting: Adult Health

## 2020-05-16 ENCOUNTER — Other Ambulatory Visit: Payer: Self-pay

## 2020-05-16 VITALS — BP 132/88 | HR 84 | Temp 97.2°F | Resp 16 | Ht 67.0 in | Wt 200.0 lb

## 2020-05-16 DIAGNOSIS — K219 Gastro-esophageal reflux disease without esophagitis: Secondary | ICD-10-CM | POA: Diagnosis not present

## 2020-05-16 DIAGNOSIS — I10 Essential (primary) hypertension: Secondary | ICD-10-CM | POA: Diagnosis not present

## 2020-05-16 DIAGNOSIS — N521 Erectile dysfunction due to diseases classified elsewhere: Secondary | ICD-10-CM | POA: Diagnosis not present

## 2020-05-16 DIAGNOSIS — E1165 Type 2 diabetes mellitus with hyperglycemia: Secondary | ICD-10-CM

## 2020-05-16 LAB — POCT GLYCOSYLATED HEMOGLOBIN (HGB A1C): Hemoglobin A1C: 6.7 % — AB (ref 4.0–5.6)

## 2020-05-16 MED ORDER — DAPAGLIFLOZIN PROPANEDIOL 10 MG PO TABS: 10.0000 mg | ORAL_TABLET | Freq: Every day | ORAL | 1 refills | Status: DC

## 2020-05-16 MED ORDER — PANTOPRAZOLE SODIUM 40 MG PO TBEC: 40.0000 mg | DELAYED_RELEASE_TABLET | Freq: Every day | ORAL | 1 refills | Status: DC

## 2020-05-16 MED ORDER — GLIMEPIRIDE 2 MG PO TABS: 2.0000 mg | ORAL_TABLET | Freq: Two times a day (BID) | ORAL | 1 refills | Status: DC

## 2020-05-16 MED ORDER — JANUMET XR 100-1000 MG PO TB24: 1.0000 | ORAL_TABLET | Freq: Every day | ORAL | 1 refills | Status: DC

## 2020-05-16 NOTE — Progress Notes (Signed)
[facility] [address] [place], [place] [address]  Internal MEDICINE  Office Visit Note  Patient Name: Ronnie Mejia  [id number]  [id number]  Date of Service: 05/16/2020  Chief Complaint  Patient presents with  . Follow-up    3 month    HPI  Pt is here for 3 month follow up.  He reports overall he is doing well. His HTN is controlled.  His GERD is currently controlled.  His A1C today is 6.7.  He has been taking his DM medications.    Current Medication: Outpatient Encounter Medications as of 05/16/2020  Medication Sig  . Accu-Chek FastClix Lancets MISC Use as directed twice daily diag E11.65  . dapagliflozin propanediol (FARXIGA) 10 MG TABS tablet Take 10 mg by mouth daily.  Marland Kitchen glimepiride (AMARYL) 2 MG tablet TAKE 1 TABLET BY MOUTH TWICE A DAY  . glucose blood (ACCU-CHEK GUIDE) test strip 1 each by Other route 2 (two) times daily. Use as instructed twice a daily e11.65  . NONFORMULARY OR COMPOUNDED ITEM Super Trimix (30/2/20)-(Pap/Phent/PGE)  Dosage: Inject 0.5  Cc to titrate up per injection   Vial 60ml  Qty #10 Refills 6  [facility] [phone] Fax [phone]  . pantoprazole (PROTONIX) 40 MG tablet Take 1 tablet (40 mg total) by mouth daily.  . sildenafil (REVATIO) 20 MG tablet Take 1 tablet (20 mg total) by mouth as needed. Take 1-5 tabs as needed prior to intercourse  . SitaGLIPtin-MetFORMIN HCl (JANUMET XR) [phone] MG TB24 Take 1 tablet by mouth daily at 12 noon.   No facility-administered encounter medications on file as of 05/16/2020.    Surgical History: Past Surgical History:  Procedure Laterality Date  . ESOPHAGOGASTRODUODENOSCOPY (EGD) WITH PROPOFOL N/A 02/28/2018   Procedure: ESOPHAGOGASTRODUODENOSCOPY (EGD) WITH PROPOFOL;  Surgeon: Jeani Hawking, MD;  Location: [facility];  Service: Endoscopy;  Laterality: N/A;  . ESOPHAGOGASTRODUODENOSCOPY (EGD) WITH PROPOFOL N/A 04/18/2018   Procedure: ESOPHAGOGASTRODUODENOSCOPY (EGD) WITH  PROPOFOL;  Surgeon: Jeani Hawking, MD;  Location: [facility];  Service: Endoscopy;  Laterality: N/A;  . HOT HEMOSTASIS N/A 02/28/2018   Procedure: HOT HEMOSTASIS (ARGON PLASMA COAGULATION/BICAP);  Surgeon: Jeani Hawking, MD;  Location: [facility];  Service: Endoscopy;  Laterality: N/A;  . HOT HEMOSTASIS N/A 04/18/2018   Procedure: HOT HEMOSTASIS (ARGON PLASMA COAGULATION/BICAP);  Surgeon: Jeani Hawking, MD;  Location: [facility];  Service: Endoscopy;  Laterality: N/A;  . NASAL SEPTOPLASTY W/ TURBINOPLASTY Bilateral 10/17/2015   Procedure: Nasal Septoplasty, Bilateral Partial Reduction Inferior Turbinates ;  Surgeon: Vernie Murders, MD;  Location: [facility];  Service: ENT;  Laterality: Bilateral;  . SHOULDER SURGERY Right    ARTHROSCOPIC  . WISDOM TOOTH EXTRACTION      Medical History: Past Medical History:  Diagnosis Date  . Anemia   . Arthritis   . Complication of anesthesia    PT WAS SHEDULED TO HAVE IGSS AT [facility] ON 10-06-15 AND AFTER INDUCTION PT ASPIRATED SCANT AMOUNT OF GASTRIC CONTENTS AND CASE WAS CANCELLED  . Deviated septum   . Diabetes mellitus ([facility])    on metformin  . GERD (gastroesophageal reflux disease)   . Hoarseness of voice    SINCE BEING INTUBATED ON 10-06-15  . Hypertension    PT DENIES DURING 10-14-15 PHONE INTERVIEW   . Nasal congestion    LONG TERM OBSTRUCTION/ENLARGED TURBINATES  . Pancreatitis 05/2014  . Pancreatitis   . PONV (postoperative nausea and vomiting)   . Sleep apnea     had surgery no longer  uses cpap    Family History: Family History  Problem Relation Age of Onset  . Diabetes Mother   . Hypertension Mother   . Diabetes Father   . Cancer Other        paternal aunt    Social History   Socioeconomic History  . Marital status: Married    Spouse name: Not on file  . Number of children: Not on file  . Years of education: Not on file  . Highest education level: Not on file  Occupational History  . Occupation: Merchandiser, retail: Sports coach  Tobacco Use  . Smoking status: Never Smoker  . Smokeless tobacco: Never Used  Vaping Use  . Vaping Use: Never used  Substance and Sexual Activity  . Alcohol use: Yes    Comment: occasional  . Drug use: No  . Sexual activity: Yes  Other Topics Concern  . Not on file  Social History Narrative  . Not on file   Social Determinants of Health   Financial Resource Strain:   . Difficulty of Paying Living Expenses:   Food Insecurity:   . Worried About Programme researcher, broadcasting/film/video in the Last Year:   . Barista in the Last Year:   Transportation Needs:   . Freight forwarder (Medical):   Marland Kitchen Lack of Transportation (Non-Medical):   Physical Activity:   . Days of Exercise per Week:   . Minutes of Exercise per Session:   Stress:   . Feeling of Stress :   Social Connections:   . Frequency of Communication with Friends and Family:   . Frequency of Social Gatherings with Friends and Family:   . Attends Religious Services:   . Active Member of Clubs or Organizations:   . Attends Banker Meetings:   Marland Kitchen Marital Status:   Intimate Partner Violence:   . Fear of Current or Ex-Partner:   . Emotionally Abused:   Marland Kitchen Physically Abused:   . Sexually Abused:       Review of Systems  Constitutional: Negative.  Negative for chills, fatigue and unexpected weight change.  HENT: Negative.  Negative for congestion, rhinorrhea, sneezing and sore throat.   Eyes: Negative for redness.  Respiratory: Negative.  Negative for cough, chest tightness and shortness of breath.   Cardiovascular: Negative.  Negative for chest pain and palpitations.  Gastrointestinal: Negative.  Negative for abdominal pain, constipation, diarrhea, nausea and vomiting.  Endocrine: Negative.   Genitourinary: Negative.  Negative for dysuria and frequency.  Musculoskeletal: Negative.  Negative for arthralgias, back pain, joint swelling and neck pain.  Skin: Negative.  Negative for rash.   Allergic/Immunologic: Negative.   Neurological: Negative.  Negative for tremors and numbness.  Hematological: Negative for adenopathy. Does not bruise/bleed easily.  Psychiatric/Behavioral: Negative.  Negative for behavioral problems, sleep disturbance and suicidal ideas. The patient is not nervous/anxious.     Vital Signs: BP (!) 126/96   Pulse 84   Temp (!) 97.2 F (36.2 C)   Resp 16   Ht 5\' 7"  (1.702 m)   Wt 200 lb (90.7 kg)   SpO2 98%   BMI 31.32 kg/m    Physical Exam Vitals and nursing note reviewed.  Constitutional:      General: He is not in acute distress.    Appearance: He is well-developed. He is not diaphoretic.  HENT:     Head: Normocephalic and atraumatic.     Mouth/Throat:     Pharynx:  No oropharyngeal exudate.  Eyes:     Pupils: Pupils are equal, round, and reactive to light.  Neck:     Thyroid: No thyromegaly.     Vascular: No JVD.     Trachea: No tracheal deviation.  Cardiovascular:     Rate and Rhythm: Normal rate and regular rhythm.     Heart sounds: Normal heart sounds. No murmur heard.  No friction rub. No gallop.   Pulmonary:     Effort: Pulmonary effort is normal. No respiratory distress.     Breath sounds: Normal breath sounds. No wheezing or rales.  Chest:     Chest wall: No tenderness.  Abdominal:     Palpations: Abdomen is soft.     Tenderness: There is no abdominal tenderness. There is no guarding.  Musculoskeletal:        General: Normal range of motion.     Cervical back: Normal range of motion and neck supple.  Lymphadenopathy:     Cervical: No cervical adenopathy.  Skin:    General: Skin is warm and dry.  Neurological:     Mental Status: He is alert and oriented to person, place, and time.     Cranial Nerves: No cranial nerve deficit.  Psychiatric:        Behavior: Behavior normal.        Thought Content: Thought content normal.        Judgment: Judgment normal.    Assessment/Plan: 1. Uncontrolled type 2 diabetes  mellitus with hyperglycemia (HCC) A1C is 6.7 today, continue with lifestyle modifications and take medications as prescribed.  - POCT HgB A1C  2. Essential hypertension Continue current management.   3. Gastroesophageal reflux disease without esophagitis Stable, continue Protonix as discussed.   4. Erectile dysfunction due to diseases classified elsewhere Patient saw urology.   General Counseling: Cordel verbalizes understanding of the findings of todays visit and agrees with plan of treatment. I have discussed any further diagnostic evaluation that may be needed or ordered today. We also reviewed his medications today. he has been encouraged to call the office with any questions or concerns that should arise related to todays visit.    Orders Placed This Encounter  Procedures  . POCT HgB A1C    No orders of the defined types were placed in this encounter.   Time spent: 30  Minutes   This patient was seen by Blima Ledger AGNP-C in Collaboration with Dr Lyndon Code as a part of collaborative care agreement     Johnna Acosta AGNP-C Internal medicine

## 2020-05-23 ENCOUNTER — Ambulatory Visit: Payer: BC Managed Care – PPO | Admitting: Adult Health

## 2020-07-14 ENCOUNTER — Telehealth: Payer: Self-pay | Admitting: Oncology

## 2020-07-14 NOTE — Telephone Encounter (Signed)
Writer received call on 07-14-20 requesting to change patient's appts to Oct due to wife getting new insurance effective 07-29-20. Writer phoned patient and rescheduled appts to 08-01-20.

## 2020-07-18 ENCOUNTER — Inpatient Hospital Stay: Payer: Self-pay | Admitting: Oncology

## 2020-07-18 ENCOUNTER — Inpatient Hospital Stay: Payer: Self-pay

## 2020-08-01 ENCOUNTER — Inpatient Hospital Stay (HOSPITAL_BASED_OUTPATIENT_CLINIC_OR_DEPARTMENT_OTHER): Payer: BC Managed Care – PPO | Admitting: Oncology

## 2020-08-01 ENCOUNTER — Inpatient Hospital Stay: Payer: Self-pay

## 2020-08-01 ENCOUNTER — Other Ambulatory Visit: Payer: Self-pay

## 2020-08-01 ENCOUNTER — Inpatient Hospital Stay: Payer: BC Managed Care – PPO | Attending: Oncology

## 2020-08-01 ENCOUNTER — Inpatient Hospital Stay: Payer: Self-pay | Admitting: Oncology

## 2020-08-01 ENCOUNTER — Encounter: Payer: Self-pay | Admitting: Oncology

## 2020-08-01 VITALS — BP 129/86 | HR 73 | Temp 98.1°F | Resp 16 | Ht 67.0 in | Wt 195.5 lb

## 2020-08-01 DIAGNOSIS — D709 Neutropenia, unspecified: Secondary | ICD-10-CM

## 2020-08-01 DIAGNOSIS — I10 Essential (primary) hypertension: Secondary | ICD-10-CM | POA: Insufficient documentation

## 2020-08-01 DIAGNOSIS — K219 Gastro-esophageal reflux disease without esophagitis: Secondary | ICD-10-CM | POA: Insufficient documentation

## 2020-08-01 DIAGNOSIS — M1990 Unspecified osteoarthritis, unspecified site: Secondary | ICD-10-CM | POA: Insufficient documentation

## 2020-08-01 DIAGNOSIS — Z7984 Long term (current) use of oral hypoglycemic drugs: Secondary | ICD-10-CM | POA: Diagnosis not present

## 2020-08-01 DIAGNOSIS — D509 Iron deficiency anemia, unspecified: Secondary | ICD-10-CM | POA: Diagnosis not present

## 2020-08-01 DIAGNOSIS — E119 Type 2 diabetes mellitus without complications: Secondary | ICD-10-CM | POA: Insufficient documentation

## 2020-08-01 DIAGNOSIS — Z79899 Other long term (current) drug therapy: Secondary | ICD-10-CM | POA: Diagnosis not present

## 2020-08-01 LAB — CBC WITH DIFFERENTIAL/PLATELET
Abs Immature Granulocytes: 0.16 10*3/uL — ABNORMAL HIGH (ref 0.00–0.07)
Basophils Absolute: 0 10*3/uL (ref 0.0–0.1)
Basophils Relative: 1 %
Eosinophils Absolute: 0.1 10*3/uL (ref 0.0–0.5)
Eosinophils Relative: 1 %
HCT: 49.2 % (ref 39.0–52.0)
Hemoglobin: 16.7 g/dL (ref 13.0–17.0)
Immature Granulocytes: 3 %
Lymphocytes Relative: 46 %
Lymphs Abs: 2.3 10*3/uL (ref 0.7–4.0)
MCH: 29 pg (ref 26.0–34.0)
MCHC: 33.9 g/dL (ref 30.0–36.0)
MCV: 85.4 fL (ref 80.0–100.0)
Monocytes Absolute: 0.3 10*3/uL (ref 0.1–1.0)
Monocytes Relative: 7 %
Neutro Abs: 2.1 10*3/uL (ref 1.7–7.7)
Neutrophils Relative %: 42 %
Platelets: 146 10*3/uL — ABNORMAL LOW (ref 150–400)
RBC: 5.76 MIL/uL (ref 4.22–5.81)
RDW: 13.7 % (ref 11.5–15.5)
WBC: 4.9 10*3/uL (ref 4.0–10.5)
nRBC: 0 % (ref 0.0–0.2)

## 2020-08-01 LAB — IRON AND TIBC
Iron: 80 ug/dL (ref 45–182)
Saturation Ratios: 19 % (ref 17.9–39.5)
TIBC: 413 ug/dL (ref 250–450)
UIBC: 333 ug/dL

## 2020-08-01 LAB — FERRITIN: Ferritin: 76 ng/mL (ref 24–336)

## 2020-08-01 NOTE — Progress Notes (Signed)
Hematology/Oncology Consult note [facility]  [phone] [phone]  Patient Care Team: Johnna AcostaScarboro, Adam J, NP as PCP - General (Adult Health Nurse Practitioner)   Name of the patient: Alison StallingRonnie Persichetti  [id number]  1971/03/10   Date of visit: 08/01/20  Diagnosis-iron deficiency anemia  Chief complaint/ Reason for visit-routine follow-up of iron deficiency anemia  Heme/Onc history: patient is a 49 year old male who has been referred to us for evaluation and management of anemia. Recent CBC from 02/16/2018 showed a white count of 3.5, H&H of 6.4/26.8 with an MCV of 63 and a platelet count of 253. He had an EGD in June 2019 which showed a 5 cm hiatal hernia and gastric antral vascular ectasia without bleeding.Subsequently patient has had 2 EGDs with GI and was reported to have some bleeding from his stomach which was cauterized. He also underwent a colonoscopy which did not reveal any evidence of bleeding. He was taking oral iron in the past but has not taken any iron since the last 3 months. Reports no dark melanotic stools or bright red blood in his stool. He occasionally takes Aleve for headache once a month. He reports problems with sexual function. Does not report any significant fatigue  Results of blood work from 06/27/2018 were as follows: CBC showed white count of 3.6, H&H of 8.1/28.7 with an MCV of 57.9 and a platelet count of 274. CMP was within normal limits. B12 and folate were normal. Ferritin levels were low at 8. Iron study showed a low iron saturation of 4%. TIBC was elevated at 575. Reticulocyte count was inappropriately low for the degree of anemia. Haptoglobin was normal at 64 and TSH was normal.Patient received 2 doses of Feraheme in August 2019  Interval history-reports feeling well and denies any complaints at this time.  Denies any bleeding in his stool or urine.  Denies any dark melanotic stools  ECOG PS- 1 Pain  scale- 0   Review of systems- Review of Systems  Constitutional: Negative for chills, fever, malaise/fatigue and weight loss.  HENT: Negative for congestion, ear discharge and nosebleeds.   Eyes: Negative for blurred vision.  Respiratory: Negative for cough, hemoptysis, sputum production, shortness of breath and wheezing.   Cardiovascular: Negative for chest pain, palpitations, orthopnea and claudication.  Gastrointestinal: Negative for abdominal pain, blood in stool, constipation, diarrhea, heartburn, melena, nausea and vomiting.  Genitourinary: Negative for dysuria, flank pain, frequency, hematuria and urgency.  Musculoskeletal: Negative for back pain, joint pain and myalgias.  Skin: Negative for rash.  Neurological: Negative for dizziness, tingling, focal weakness, seizures, weakness and headaches.  Endo/Heme/Allergies: Does not bruise/bleed easily.  Psychiatric/Behavioral: Negative for depression and suicidal ideas. The patient does not have insomnia.        No Known Allergies   Past Medical History:  Diagnosis Date  . Anemia   . Arthritis   . Complication of anesthesia    PT WAS SHEDULED TO HAVE [facility]SS AT [facility] ON 10-06-15 AND AFTER INDUCTION PT ASPIRATED SCANT AMOUNT OF GASTRIC CONTENTS AND CASE WAS CANCELLED  . Deviated septum   . Diabetes mellitus (HCC)    on metformin  . GERD (gastroesophageal reflux disease)   . Hoarseness of voice    SINCE BEING INTUBATED ON 10-06-15  . Hypertension    PT DENIES DURING 10-14-15 PHONE INTERVIEW   . Nasal congestion    LONG TERM OBSTRUCTION/ENLARGED TURBINATES  . Pancreatitis 05/2014  . Pancreatitis   . PONV (postoperative nausea  and vomiting)   . Sleep apnea     had surgery no longer uses cpap     Past Surgical History:  Procedure Laterality Date  . ESOPHAGOGASTRODUODENOSCOPY (EGD) WITH PROPOFOL N/A 02/28/2018   Procedure: ESOPHAGOGASTRODUODENOSCOPY (EGD) WITH PROPOFOL;  Surgeon: Jeani Hawking, MD;  Location: [facility]  END[facility]COPY;  Service: Endoscopy;  Laterality: N/A;  . ESOPHAGOGASTRODUODENOSCOPY (EGD) WITH PROPOFOL N/A 04/18/2018   Procedure: ESOPHAGOGASTRODUODENOSCOPY (EGD) WITH PROPOFOL;  Surgeon: Jeani Hawking, MD;  Location: [facility] END[facility];  Service: Endoscopy;  Laterality: N/A;  . HOT HEMOSTASIS N/A 02/28/2018   Procedure: HOT HEMOSTASIS (ARGON PLASMA COAGULATION/BICAP);  Surgeon: Jeani Hawking, MD;  Location: [facility];  Service: Endoscopy;  Laterality: N/A;  . HOT HEMOSTASIS N/A 04/18/2018   Procedure: HOT HEMOSTASIS (ARGON PLASMA COAGULATION/BICAP);  Surgeon: Jeani Hawking, MD;  Location: [facility];  Service: Endoscopy;  Laterality: N/A;  . NASAL SEPTOPLASTY W/ TURBINOPLASTY Bilateral 10/17/2015   Procedure: Nasal Septoplasty, Bilateral Partial Reduction Inferior Turbinates ;  Surgeon: Vernie Murders, MD;  Location: [facility] ORS;  Service: ENT;  Laterality: Bilateral;  . SHOULDER SURGERY Right    ARTHROSCOPIC  . WISDOM TOOTH EXTRACTION      Social History   Socioeconomic History  . Marital status: Married    Spouse name: Not on file  . Number of children: Not on file  . Years of education: Not on file  . Highest education level: Not on file  Occupational History  . Occupation: Sports administrator: Sports coach  Tobacco Use  . Smoking status: Never Smoker  . Smokeless tobacco: Never Used  Vaping Use  . Vaping Use: Never used  Substance and Sexual Activity  . Alcohol use: Yes    Comment: occasional  . Drug use: No  . Sexual activity: Yes  Other Topics Concern  . Not on file  Social History Narrative  . Not on file   Social Determinants of Health   Financial Resource Strain:   . Difficulty of Paying Living Expenses: Not on file  Food Insecurity:   . Worried About Programme researcher, broadcasting/film/video in the Last Year: Not on file  . Ran Out of Food in the Last Year: Not on file  Transportation Needs:   . Lack of Transportation (Medical): Not on file  . Lack of Transportation (Non-Medical): Not on file   Physical Activity:   . Days of Exercise per Week: Not on file  . Minutes of Exercise per Session: Not on file  Stress:   . Feeling of Stress : Not on file  Social Connections:   . Frequency of Communication with Friends and Family: Not on file  . Frequency of Social Gatherings with Friends and Family: Not on file  . Attends Religious Services: Not on file  . Active Member of Clubs or Organizations: Not on file  . Attends Banker Meetings: Not on file  . Marital Status: Not on file  Intimate Partner Violence:   . Fear of Current or Ex-Partner: Not on file  . Emotionally Abused: Not on file  . Physically Abused: Not on file  . Sexually Abused: Not on file    Family History  Problem Relation Age of Onset  . Diabetes Mother   . Hypertension Mother   . Diabetes Father   . Cancer Other        paternal aunt     Current Outpatient Medications:  .  Accu-Chek FastClix Lancets MISC, Use as directed twice daily diag E11.65, Disp: 100 each,  Rfl: 2 .  dapagliflozin propanediol (FARXIGA) 10 MG TABS tablet, Take 1 tablet (10 mg total) by mouth daily., Disp: 90 tablet, Rfl: 1 .  glimepiride (AMARYL) 2 MG tablet, Take 1 tablet (2 mg total) by mouth 2 (two) times daily., Disp: 180 tablet, Rfl: 1 .  glucose blood (ACCU-CHEK GUIDE) test strip, 1 each by Other route 2 (two) times daily. Use as instructed twice a daily e11.65, Disp: 100 each, Rfl: 3 .  pantoprazole (PROTONIX) 40 MG tablet, Take 1 tablet (40 mg total) by mouth daily., Disp: 90 tablet, Rfl: 1 .  sildenafil (REVATIO) 20 MG tablet, Take 1 tablet (20 mg total) by mouth as needed. Take 1-5 tabs as needed prior to intercourse, Disp: 10 tablet, Rfl: 2 .  SitaGLIPtin-MetFORMIN HCl (JANUMET XR) [phone] MG TB24, Take 1 tablet by mouth daily at 12 noon., Disp: 90 tablet, Rfl: 1  Physical exam:  Vitals:   08/01/20 1049  BP: 129/86  Pulse: 73  Resp: 16  Temp: 98.1 F (36.7 C)  TempSrc: Oral  Weight: 195 lb 8 oz (88.7 kg)    Height: 5\' 7"  (1.702 m)   Physical Exam Constitutional:      General: He is not in acute distress. Cardiovascular:     Rate and Rhythm: Normal rate and regular rhythm.     Heart sounds: Normal heart sounds.  Pulmonary:     Effort: Pulmonary effort is normal.     Breath sounds: Normal breath sounds.  Abdominal:     General: Bowel sounds are normal.     Palpations: Abdomen is soft.  Skin:    General: Skin is warm and dry.  Neurological:     Mental Status: He is alert and oriented to person, place, and time.      CMP Latest Ref Rng & Units 10/26/2019  Glucose 65 - 99 mg/dL 10/28/2019)  BUN 6 - 24 mg/dL 15  Creatinine 732(K - 0.25 mg/dL 4.27)  Sodium 0.62(B - 762 mmol/L 140  Potassium 3.5 - 5.2 mmol/L 4.3  Chloride 96 - 106 mmol/L 102  CO2 20 - 29 mmol/L 23  Calcium 8.7 - 10.2 mg/dL 9.2  Total Protein 6.0 - 8.5 g/dL 7.9  Total Bilirubin 0.0 - 1.2 mg/dL 0.5  Alkaline Phos 39 - 117 IU/L 74  AST 0 - 40 IU/L 30  ALT 0 - 44 IU/L 34   CBC Latest Ref Rng & Units 08/01/2020  WBC 4.0 - 10.5 K/uL 4.9  Hemoglobin 13.0 - 17.0 g/dL 10/01/2020  Hematocrit 39 - 52 % 49.2  Platelets 150 - 400 K/uL 146(L)      Assessment and plan- Patient is a 49 y.o. male with iron deficiency anemia here for routine follow-up  Patient last received IV iron back in 2019.  Since then his hemoglobin is improved and he has not required any iron since then.  Iron studies presently are normal.  In fact hemoglobin is at the upper limit of normal at 16.7.  It has remained around 15-16 for the last 2 years.  He does not require any IV iron at this time or hematology follow-up.  Patient reports that he was found to have low testosterone levels.  I did discuss with him briefly that if he were to go on testosterone replacement therapy in the future his hemoglobin levels can rise further and he may need a phlebotomy at such time.  Patient can be referred to 2020 in the future if questions or concerns arise  Visit Diagnosis 1.  Iron deficiency anemia, unspecified iron deficiency anemia type      Dr. Owens Shark, MD, MPH [facility] [phone] 08/01/2020 3:28 PM

## 2020-08-01 NOTE — Progress Notes (Signed)
Pt feels good, no concerns 

## 2020-08-15 ENCOUNTER — Encounter: Payer: Self-pay | Admitting: Adult Health

## 2020-08-15 ENCOUNTER — Other Ambulatory Visit: Payer: Self-pay

## 2020-08-15 ENCOUNTER — Ambulatory Visit (INDEPENDENT_AMBULATORY_CARE_PROVIDER_SITE_OTHER): Payer: BC Managed Care – PPO | Admitting: Adult Health

## 2020-08-15 VITALS — BP 120/78 | HR 90 | Temp 97.9°F | Resp 16 | Ht 67.0 in | Wt 198.6 lb

## 2020-08-15 DIAGNOSIS — N521 Erectile dysfunction due to diseases classified elsewhere: Secondary | ICD-10-CM | POA: Diagnosis not present

## 2020-08-15 DIAGNOSIS — Z0001 Encounter for general adult medical examination with abnormal findings: Secondary | ICD-10-CM

## 2020-08-15 DIAGNOSIS — Z125 Encounter for screening for malignant neoplasm of prostate: Secondary | ICD-10-CM

## 2020-08-15 DIAGNOSIS — R300 Dysuria: Secondary | ICD-10-CM

## 2020-08-15 DIAGNOSIS — E1165 Type 2 diabetes mellitus with hyperglycemia: Secondary | ICD-10-CM

## 2020-08-15 LAB — POCT GLYCOSYLATED HEMOGLOBIN (HGB A1C): Hemoglobin A1C: 6.5 % — AB (ref 4.0–5.6)

## 2020-08-15 LAB — POCT UA - MICROALBUMIN
Albumin/Creatinine Ratio, Urine, POC: >300
Creatinine, POC: 100 mg/dL
Microalbumin Ur, POC: 150 mg/L

## 2020-08-15 MED ORDER — JANUMET XR 100-1000 MG PO TB24: 1.0000 | ORAL_TABLET | Freq: Every day | ORAL | 2 refills | Status: DC

## 2020-08-15 NOTE — Progress Notes (Signed)
[facility] [address] [place], [place] [address]  Internal MEDICINE  Office Visit Note  Patient Name: Ronnie Mejia  [id number]  [id number]  Date of Service: 08/15/2020  Chief Complaint  Patient presents with  . Annual Exam    refill request  . Diabetes  . Hypertension  . Anemia  . policy update form    received  . Quality Metric Gaps    pneumovax     HPI Pt is here for routine health maintenance examination.  Pt is a well appearing 49 yo male.  He has a history of DM, HTN and anemia.  He reports he has been released from hematology at this time.  We will continue to monitor his blood counts for future issues.  His DM is well controlled A1C today is 6.5. His blood pressure is also well controlled. He also continues to have issues with erectile dysfunction. He was told his testosterone level was low but he did not have the $1800 dollars to be treated at the [facility].    Current Medication: Outpatient Encounter Medications as of 08/15/2020  Medication Sig  . Accu-Chek FastClix Lancets MISC Use as directed twice daily diag E11.65  . dapagliflozin propanediol (FARXIGA) 10 MG TABS tablet Take 1 tablet (10 mg total) by mouth daily.  Marland Kitchen glimepiride (AMARYL) 2 MG tablet Take 1 tablet (2 mg total) by mouth 2 (two) times daily.  Marland Kitchen glucose blood (ACCU-CHEK GUIDE) test strip 1 each by Other route 2 (two) times daily. Use as instructed twice a daily e11.65  . pantoprazole (PROTONIX) 40 MG tablet Take 1 tablet (40 mg total) by mouth daily.  . sildenafil (REVATIO) 20 MG tablet Take 1 tablet (20 mg total) by mouth as needed. Take 1-5 tabs as needed prior to intercourse  . SitaGLIPtin-MetFORMIN HCl (JANUMET XR) [phone] MG TB24 Take 1 tablet by mouth daily at 12 noon.  . [DISCONTINUED] SitaGLIPtin-MetFORMIN HCl (JANUMET XR) [phone] MG TB24 Take 1 tablet by mouth daily at 12 noon.   No facility-administered encounter medications on file as of 08/15/2020.    Surgical  History: Past Surgical History:  Procedure Laterality Date  . ESOPHAGOGASTRODUODENOSCOPY (EGD) WITH PROPOFOL N/A 02/28/2018   Procedure: ESOPHAGOGASTRODUODENOSCOPY (EGD) WITH PROPOFOL;  Surgeon: Jeani Hawking, MD;  Location: [facility];  Service: Endoscopy;  Laterality: N/A;  . ESOPHAGOGASTRODUODENOSCOPY (EGD) WITH PROPOFOL N/A 04/18/2018   Procedure: ESOPHAGOGASTRODUODENOSCOPY (EGD) WITH PROPOFOL;  Surgeon: Jeani Hawking, MD;  Location: [facility];  Service: Endoscopy;  Laterality: N/A;  . HOT HEMOSTASIS N/A 02/28/2018   Procedure: HOT HEMOSTASIS (ARGON PLASMA COAGULATION/BICAP);  Surgeon: Jeani Hawking, MD;  Location: [facility];  Service: Endoscopy;  Laterality: N/A;  . HOT HEMOSTASIS N/A 04/18/2018   Procedure: HOT HEMOSTASIS (ARGON PLASMA COAGULATION/BICAP);  Surgeon: Jeani Hawking, MD;  Location: [facility];  Service: Endoscopy;  Laterality: N/A;  . NASAL SEPTOPLASTY W/ TURBINOPLASTY Bilateral 10/17/2015   Procedure: Nasal Septoplasty, Bilateral Partial Reduction Inferior Turbinates ;  Surgeon: Vernie Murders, MD;  Location: [facility] ORS;  Service: ENT;  Laterality: Bilateral;  . SHOULDER SURGERY Right    ARTHROSCOPIC  . WISDOM TOOTH EXTRACTION      Medical History: Past Medical History:  Diagnosis Date  . Anemia   . Arthritis   . Complication of anesthesia    PT WAS SHEDULED TO HAVE IGSS AT [facility] ON 10-06-15 AND AFTER INDUCTION PT ASPIRATED SCANT AMOUNT OF GASTRIC CONTENTS AND CASE WAS CANCELLED  . Deviated septum   . Diabetes mellitus (  HCC)    on metformin  . GERD (gastroesophageal reflux disease)   . Hoarseness of voice    SINCE BEING INTUBATED ON 10-06-15  . Hypertension    PT DENIES DURING 10-14-15 PHONE INTERVIEW   . Nasal congestion    LONG TERM OBSTRUCTION/ENLARGED TURBINATES  . Pancreatitis 05/2014  . Pancreatitis   . PONV (postoperative nausea and vomiting)   . Sleep apnea     had surgery no longer uses cpap    Family History: Family History   Problem Relation Age of Onset  . Diabetes Mother   . Hypertension Mother   . Diabetes Father   . Cancer Other        paternal aunt      Review of Systems  Constitutional: Negative.  Negative for chills, fatigue and unexpected weight change.  HENT: Negative.  Negative for congestion, rhinorrhea, sneezing and sore throat.   Eyes: Negative for redness.  Respiratory: Negative.  Negative for cough, chest tightness and shortness of breath.   Cardiovascular: Negative.  Negative for chest pain and palpitations.  Gastrointestinal: Negative.  Negative for abdominal pain, constipation, diarrhea, nausea and vomiting.  Endocrine: Negative.   Genitourinary: Negative.  Negative for dysuria and frequency.  Musculoskeletal: Negative.  Negative for arthralgias, back pain, joint swelling and neck pain.  Skin: Negative.  Negative for rash.  Allergic/Immunologic: Negative.   Neurological: Negative.  Negative for tremors and numbness.  Hematological: Negative for adenopathy. Does not bruise/bleed easily.  Psychiatric/Behavioral: Negative.  Negative for behavioral problems, sleep disturbance and suicidal ideas. The patient is not nervous/anxious.      Vital Signs: BP 120/78   Pulse 90   Temp 97.9 F (36.6 C)   Resp 16   Ht 5\' 7"  (1.702 m)   Wt 198 lb 9.6 oz (90.1 kg)   SpO2 97%   BMI 31.11 kg/m    Physical Exam Vitals and nursing note reviewed.  Constitutional:      General: He is not in acute distress.    Appearance: He is well-developed. He is not diaphoretic.  HENT:     Head: Normocephalic and atraumatic.     Mouth/Throat:     Pharynx: No oropharyngeal exudate.  Eyes:     Pupils: Pupils are equal, round, and reactive to light.  Neck:     Thyroid: No thyromegaly.     Vascular: No JVD.     Trachea: No tracheal deviation.  Cardiovascular:     Rate and Rhythm: Normal rate and regular rhythm.     Heart sounds: Normal heart sounds. No murmur heard.  No friction rub. No gallop.    Pulmonary:     Effort: Pulmonary effort is normal. No respiratory distress.     Breath sounds: Normal breath sounds. No wheezing or rales.  Chest:     Chest wall: No tenderness.  Abdominal:     Palpations: Abdomen is soft.  Musculoskeletal:        General: Normal range of motion.     Cervical back: Normal range of motion and neck supple.  Lymphadenopathy:     Cervical: No cervical adenopathy.  Skin:    General: Skin is warm and dry.  Neurological:     Mental Status: He is alert and oriented to person, place, and time.     Cranial Nerves: No cranial nerve deficit.  Psychiatric:        Behavior: Behavior normal.        Thought Content: Thought content normal.  Judgment: Judgment normal.      LABS: Recent Results (from the past 2160 hour(s))  Iron and TIBC     Status: None   Collection Time: 08/01/20 10:12 AM  Result Value Ref Range   Iron 80 45 - 182 ug/dL   TIBC 660 630 - 160 ug/dL   Saturation Ratios 19 17.9 - 39.5 %   UIBC 333 ug/dL    Comment: Performed at [facility], [address] [place], [place] [address]  Ferritin     Status: None   Collection Time: 08/01/20 10:12 AM  Result Value Ref Range   Ferritin 76 24 - 336 ng/mL    Comment: Performed at [facility], [address] [place], [place] [address]  CBC with Differential/Platelet     Status: Abnormal   Collection Time: 08/01/20 10:12 AM  Result Value Ref Range   WBC 4.9 4.0 - 10.5 K/uL   RBC 5.76 4.22 - 5.81 MIL/uL   Hemoglobin 16.7 13.0 - 17.0 g/dL   HCT 22.0 39 - 52 %   MCV 85.4 80.0 - 100.0 fL   MCH 29.0 26.0 - 34.0 pg   MCHC 33.9 30.0 - 36.0 g/dL   RDW 25.4 27.0 - 62.3 %   Platelets 146 (L) 150 - 400 K/uL   nRBC 0.0 0.0 - 0.2 %   Neutrophils Relative % 42 %   Neutro Abs 2.1 1.7 - 7.7 K/uL   Lymphocytes Relative 46 %   Lymphs Abs 2.3 0.7 - 4.0 K/uL   Monocytes Relative 7 %   Monocytes Absolute 0.3 0.1 - 1.0 K/uL   Eosinophils Relative 1 %   Eosinophils Absolute 0.1  0.0 - 0.5 K/uL   Basophils Relative 1 %   Basophils Absolute 0.0 0.0 - 0.1 K/uL   Immature Granulocytes 3 %   Abs Immature Granulocytes 0.16 (H) 0.00 - 0.07 K/uL    Comment: Performed at [facility], [address] [place], [place] [address]    Assessment/Plan: 1. Encounter for general adult medical examination with abnormal findings Up to date on PHM.  - CBC with Differential/Platelet - Lipid Panel With LDL/HDL Ratio - TSH - T4, free - Comprehensive metabolic panel  2. Uncontrolled type 2 diabetes mellitus with hyperglycemia (HCC) - POCT HgB A1C - POCT UA - Microalbumin  3. Screening for prostate cancer - PSA  4. Type 2 diabetes mellitus with hyperglycemia, unspecified whether long term insulin use (HCC) Stable, A1C today is 6.5, continue janumet, farxiga and glimeperide as directed.  - SitaGLIPtin-MetFORMIN HCl (JANUMET XR) [phone] MG TB24; Take 1 tablet by mouth daily at 12 noon.  Dispense: 90 tablet; Refill: 2  5. Erectile dysfunction due to diseases classified elsewhere Plan is to check testosterone and discuss injections.  - Testosterone,Free and Total - Prolactin  6. Dysuria - UA/M w/rflx Culture, Routine  General Counseling: Ronnie Mejia verbalizes understanding of the findings of todays visit and agrees with plan of treatment. I have discussed any further diagnostic evaluation that may be needed or ordered today. We also reviewed his medications today. he has been encouraged to call the office with any questions or concerns that should arise related to todays visit.   Orders Placed This Encounter  Procedures  . UA/M w/rflx Culture, Routine  . CBC with Differential/Platelet  . Lipid Panel With LDL/HDL Ratio  . TSH  . T4, free  . Comprehensive metabolic panel  . PSA  . Testosterone,Free and Total  . Prolactin  . POCT  HgB A1C  . POCT UA - Microalbumin    Meds ordered this encounter  Medications  . SitaGLIPtin-MetFORMIN HCl (JANUMET XR) [phone] MG  TB24    Sig: Take 1 tablet by mouth daily at 12 noon.    Dispense:  90 tablet    Refill:  2    Time spent: 30 Minutes   This patient was seen by Blima LedgerAdam Vershawn Westrup AGNP-C in Collaboration with Dr Lyndon CodeFozia M Khan as a part of collaborative care agreement    Ronnie AcostaAdam J. Jamareon Mejia AGNP-C Internal Medicine

## 2020-08-16 LAB — UA/M W/RFLX CULTURE, ROUTINE
Bilirubin, UA: NEGATIVE
Leukocytes,UA: NEGATIVE
Nitrite, UA: NEGATIVE
RBC, UA: NEGATIVE
Specific Gravity, UA: >=1.03 — AB (ref 1.005–1.030)
Urobilinogen, Ur: 1 mg/dL (ref 0.2–1.0)
pH, UA: 5.5 (ref 5.0–7.5)

## 2020-08-16 LAB — MICROSCOPIC EXAMINATION
Bacteria, UA: NONE SEEN
Casts: NONE SEEN /lpf
Epithelial Cells (non renal): NONE SEEN /hpf (ref 0–10)
RBC, Urine: NONE SEEN /hpf (ref 0–2)
WBC, UA: NONE SEEN /hpf (ref 0–5)

## 2020-08-18 ENCOUNTER — Telehealth: Payer: Self-pay

## 2020-08-18 NOTE — Telephone Encounter (Signed)
Pt has new insurance as of Jul 29, 2020 and doesn't need PA for farxiga at this time.

## 2020-09-20 DIAGNOSIS — Z125 Encounter for screening for malignant neoplasm of prostate: Secondary | ICD-10-CM | POA: Diagnosis not present

## 2020-09-20 DIAGNOSIS — Z0001 Encounter for general adult medical examination with abnormal findings: Secondary | ICD-10-CM | POA: Diagnosis not present

## 2020-09-20 DIAGNOSIS — N521 Erectile dysfunction due to diseases classified elsewhere: Secondary | ICD-10-CM | POA: Diagnosis not present

## 2020-09-21 LAB — CBC WITH DIFFERENTIAL/PLATELET
Basophils Absolute: 0 10*3/uL (ref 0.0–0.2)
Basos: 1 %
EOS (ABSOLUTE): 0.1 10*3/uL (ref 0.0–0.4)
Eos: 1 %
Hematocrit: 49 % (ref 37.5–51.0)
Hemoglobin: 16.4 g/dL (ref 13.0–17.7)
Immature Grans (Abs): 0.1 10*3/uL (ref 0.0–0.1)
Immature Granulocytes: 2 %
Lymphocytes Absolute: 2.6 10*3/uL (ref 0.7–3.1)
Lymphs: 51 %
MCH: 28.4 pg (ref 26.6–33.0)
MCHC: 33.5 g/dL (ref 31.5–35.7)
MCV: 85 fL (ref 79–97)
Monocytes Absolute: 0.5 10*3/uL (ref 0.1–0.9)
Monocytes: 10 %
Neutrophils Absolute: 1.8 10*3/uL (ref 1.4–7.0)
Neutrophils: 35 %
Platelets: 176 10*3/uL (ref 150–450)
RBC: 5.77 x10E6/uL (ref 4.14–5.80)
RDW: 13.3 % (ref 11.6–15.4)
WBC: 5 10*3/uL (ref 3.4–10.8)

## 2020-09-21 LAB — COMPREHENSIVE METABOLIC PANEL
ALT: 26 IU/L (ref 0–44)
AST: 21 IU/L (ref 0–40)
Albumin/Globulin Ratio: 1.5 (ref 1.2–2.2)
Albumin: 4.5 g/dL (ref 4.0–5.0)
Alkaline Phosphatase: 75 IU/L (ref 44–121)
BUN/Creatinine Ratio: 14 (ref 9–20)
BUN: 14 mg/dL (ref 6–24)
Bilirubin Total: 0.6 mg/dL (ref 0.0–1.2)
CO2: 23 mmol/L (ref 20–29)
Calcium: 9.4 mg/dL (ref 8.7–10.2)
Chloride: 100 mmol/L (ref 96–106)
Creatinine, Ser: 0.97 mg/dL (ref 0.76–1.27)
GFR calc Af Amer: 107 mL/min/{1.73_m2} (ref >59)
GFR calc non Af Amer: 93 mL/min/{1.73_m2} (ref >59)
Globulin, Total: 3.1 g/dL (ref 1.5–4.5)
Glucose: 161 mg/dL — ABNORMAL HIGH (ref 65–99)
Potassium: 4.2 mmol/L (ref 3.5–5.2)
Sodium: 139 mmol/L (ref 134–144)
Total Protein: 7.6 g/dL (ref 6.0–8.5)

## 2020-09-21 LAB — TSH: TSH: 2.45 u[IU]/mL (ref 0.450–4.500)

## 2020-09-21 LAB — LIPID PANEL WITH LDL/HDL RATIO
Cholesterol, Total: 188 mg/dL (ref 100–199)
HDL: 31 mg/dL — ABNORMAL LOW (ref >39)
LDL Chol Calc (NIH): 128 mg/dL — ABNORMAL HIGH (ref 0–99)
LDL/HDL Ratio: 4.1 ratio — ABNORMAL HIGH (ref 0.0–3.6)
Triglycerides: 160 mg/dL — ABNORMAL HIGH (ref 0–149)
VLDL Cholesterol Cal: 29 mg/dL (ref 5–40)

## 2020-09-21 LAB — PSA: Prostate Specific Ag, Serum: 0.5 ng/mL (ref 0.0–4.0)

## 2020-09-21 LAB — T4, FREE: Free T4: 1.15 ng/dL (ref 0.82–1.77)

## 2020-09-22 LAB — TESTOSTERONE,FREE AND TOTAL
Testosterone, Free: 15.2 pg/mL (ref 6.8–21.5)
Testosterone: 283 ng/dL (ref 264–916)

## 2020-09-22 LAB — PROLACTIN: Prolactin: 12.7 ng/mL (ref 4.0–15.2)

## 2020-09-27 ENCOUNTER — Ambulatory Visit: Payer: BC Managed Care – PPO | Admitting: Hospice and Palliative Medicine

## 2020-09-28 ENCOUNTER — Encounter: Payer: Self-pay | Admitting: Hospice and Palliative Medicine

## 2020-09-28 ENCOUNTER — Ambulatory Visit: Payer: BC Managed Care – PPO | Admitting: Hospice and Palliative Medicine

## 2020-09-28 ENCOUNTER — Other Ambulatory Visit: Payer: Self-pay

## 2020-09-28 DIAGNOSIS — Z135 Encounter for screening for eye and ear disorders: Secondary | ICD-10-CM | POA: Diagnosis not present

## 2020-09-28 DIAGNOSIS — N529 Male erectile dysfunction, unspecified: Secondary | ICD-10-CM

## 2020-09-28 DIAGNOSIS — G4733 Obstructive sleep apnea (adult) (pediatric): Secondary | ICD-10-CM

## 2020-09-28 DIAGNOSIS — E782 Mixed hyperlipidemia: Secondary | ICD-10-CM | POA: Diagnosis not present

## 2020-09-28 NOTE — Progress Notes (Signed)
[facility] [address] [place], [place] [address]  Internal MEDICINE  Office Visit Note  Patient Name: Ronnie Mejia  [id number]  [id number]  Date of Service: 10/03/2020  Chief Complaint  Patient presents with  . Follow-up    review labs  . Hypertension  . Gastroesophageal Reflux  . Sleep Apnea  . Anemia  . Diabetes  . policy update form    received  . Quality Metric Gaps    eye exam    HPI Patient is here for routine follow-up Reviewed his recent labs with him-lipid panel elevated, he is aware and he would like the opportunity to improve his levels through improving his diet Testosterone as well as prolactin levels are normal Has been having issues with ED for several years--was told by a clinic in [facility] that his testosterone levels were low and recommended testosterone replacement therapy Has struggled with symptoms of ED for over 10 years--continues to get more difficult to obtain an erection which is causing struggles in his relationship Sildenafil does not seem to be working anymore, has been on other medications in the past as well that were not helpful Has never been evaluated by urology  Diagnosed with OSA several years ago--underwent UPPP, has not been retested for OSA since surgery, wife has told him that he is snoring again, he does feel fatigued during the day  Current Medication: Outpatient Encounter Medications as of 09/28/2020  Medication Sig  . Accu-Chek FastClix Lancets MISC Use as directed twice daily diag E11.65  . dapagliflozin propanediol (FARXIGA) 10 MG TABS tablet Take 1 tablet (10 mg total) by mouth daily.  Marland Kitchen glimepiride (AMARYL) 2 MG tablet Take 1 tablet (2 mg total) by mouth 2 (two) times daily.  Marland Kitchen glucose blood (ACCU-CHEK GUIDE) test strip 1 each by Other route 2 (two) times daily. Use as instructed twice a daily e11.65  . pantoprazole (PROTONIX) 40 MG tablet Take 1 tablet (40 mg total) by mouth daily.  . sildenafil (REVATIO)  20 MG tablet Take 1 tablet (20 mg total) by mouth as needed. Take 1-5 tabs as needed prior to intercourse  . SitaGLIPtin-MetFORMIN HCl (JANUMET XR) [phone] MG TB24 Take 1 tablet by mouth daily at 12 noon.   No facility-administered encounter medications on file as of 09/28/2020.    Surgical History: Past Surgical History:  Procedure Laterality Date  . ESOPHAGOGASTRODUODENOSCOPY (EGD) WITH PROPOFOL N/A 02/28/2018   Procedure: ESOPHAGOGASTRODUODENOSCOPY (EGD) WITH PROPOFOL;  Surgeon: Jeani Hawking, MD;  Location: [facility];  Service: Endoscopy;  Laterality: N/A;  . ESOPHAGOGASTRODUODENOSCOPY (EGD) WITH PROPOFOL N/A 04/18/2018   Procedure: ESOPHAGOGASTRODUODENOSCOPY (EGD) WITH PROPOFOL;  Surgeon: Jeani Hawking, MD;  Location: [facility];  Service: Endoscopy;  Laterality: N/A;  . HOT HEMOSTASIS N/A 02/28/2018   Procedure: HOT HEMOSTASIS (ARGON PLASMA COAGULATION/BICAP);  Surgeon: Jeani Hawking, MD;  Location: [facility];  Service: Endoscopy;  Laterality: N/A;  . HOT HEMOSTASIS N/A 04/18/2018   Procedure: HOT HEMOSTASIS (ARGON PLASMA COAGULATION/BICAP);  Surgeon: Jeani Hawking, MD;  Location: [facility];  Service: Endoscopy;  Laterality: N/A;  . NASAL SEPTOPLASTY W/ TURBINOPLASTY Bilateral 10/17/2015   Procedure: Nasal Septoplasty, Bilateral Partial Reduction Inferior Turbinates ;  Surgeon: Vernie Murders, MD;  Location: [facility];  Service: ENT;  Laterality: Bilateral;  . SHOULDER SURGERY Right    ARTHROSCOPIC  . WISDOM TOOTH EXTRACTION      Medical History: Past Medical History:  Diagnosis Date  . Anemia   . Arthritis   . Complication of anesthesia  PT WAS SHEDULED TO HAVE IGSS AT [facility] ON 10-06-15 AND AFTER INDUCTION PT ASPIRATED SCANT AMOUNT OF GASTRIC CONTENTS AND CASE WAS CANCELLED  . Deviated septum   . Diabetes mellitus (HCC)    on metformin  . GERD (gastroesophageal reflux disease)   . Hoarseness of voice    SINCE BEING INTUBATED ON 10-06-15  . Hypertension     PT DENIES DURING 10-14-15 PHONE INTERVIEW   . Nasal congestion    LONG TERM OBSTRUCTION/ENLARGED TURBINATES  . Pancreatitis 05/2014  . Pancreatitis   . PONV (postoperative nausea and vomiting)   . Sleep apnea     had surgery no longer uses cpap    Family History: Family History  Problem Relation Age of Onset  . Diabetes Mother   . Hypertension Mother   . Diabetes Father   . Cancer Other        paternal aunt    Social History   Socioeconomic History  . Marital status: Married    Spouse name: Not on file  . Number of children: Not on file  . Years of education: Not on file  . Highest education level: Not on file  Occupational History  . Occupation: Sports administrator: Sports coach  Tobacco Use  . Smoking status: Never Smoker  . Smokeless tobacco: Never Used  Vaping Use  . Vaping Use: Never used  Substance and Sexual Activity  . Alcohol use: Yes    Comment: occasional  . Drug use: No  . Sexual activity: Yes  Other Topics Concern  . Not on file  Social History Narrative  . Not on file   Social Determinants of Health   Financial Resource Strain:   . Difficulty of Paying Living Expenses: Not on file  Food Insecurity:   . Worried About Programme researcher, broadcasting/film/video in the Last Year: Not on file  . Ran Out of Food in the Last Year: Not on file  Transportation Needs:   . Lack of Transportation (Medical): Not on file  . Lack of Transportation (Non-Medical): Not on file  Physical Activity:   . Days of Exercise per Week: Not on file  . Minutes of Exercise per Session: Not on file  Stress:   . Feeling of Stress : Not on file  Social Connections:   . Frequency of Communication with Friends and Family: Not on file  . Frequency of Social Gatherings with Friends and Family: Not on file  . Attends Religious Services: Not on file  . Active Member of Clubs or Organizations: Not on file  . Attends Banker Meetings: Not on file  . Marital Status: Not on file   Intimate Partner Violence:   . Fear of Current or Ex-Partner: Not on file  . Emotionally Abused: Not on file  . Physically Abused: Not on file  . Sexually Abused: Not on file   Review of Systems  Constitutional: Positive for fatigue. Negative for chills and unexpected weight change.  HENT: Negative for congestion, postnasal drip, rhinorrhea, sneezing and sore throat.   Eyes: Negative for redness.  Respiratory: Negative for cough, chest tightness and shortness of breath.   Cardiovascular: Negative for chest pain and palpitations.  Gastrointestinal: Negative for abdominal pain, constipation, diarrhea, nausea and vomiting.  Genitourinary: Negative for dysuria and frequency.  Musculoskeletal: Negative for arthralgias, back pain, joint swelling and neck pain.  Skin: Negative for rash.  Neurological: Negative for tremors and numbness.  Hematological: Negative for adenopathy. Does  not bruise/bleed easily.  Psychiatric/Behavioral: Positive for sleep disturbance. Negative for behavioral problems (Depression) and suicidal ideas. The patient is not nervous/anxious.       Vital Signs: BP 122/78   Pulse 78   Temp (!) 97.3 F (36.3 C)   Resp 16   Ht 5\' 7"  (1.702 m)   Wt 203 lb (92.1 kg)   SpO2 98%   BMI 31.79 kg/m    Physical Exam Vitals reviewed.  Constitutional:      Appearance: Normal appearance. He is obese.  Cardiovascular:     Rate and Rhythm: Normal rate and regular rhythm.     Pulses: Normal pulses.     Heart sounds: Normal heart sounds.  Pulmonary:     Effort: Pulmonary effort is normal.     Breath sounds: Normal breath sounds.  Abdominal:     General: Abdomen is flat.  Musculoskeletal:        General: Normal range of motion.     Cervical back: Normal range of motion.  Skin:    General: Skin is warm.  Neurological:     General: No focal deficit present.     Mental Status: He is alert and oriented to person, place, and time. Mental status is at baseline.   Psychiatric:        Mood and Affect: Mood normal.        Behavior: Behavior normal.        Thought Content: Thought content normal.     Assessment/Plan: 1. OSA (obstructive sleep apnea) Hx of OSA in the past, underwent UPP without retesting, symptoms of snoring, daytime fatigue, ED complications and sleep disturbance possibly linked to untreated OSA Will retest for OSA and proceed with CPAP therapy if indicated - Home sleep test  2. Erectile dysfunction, unspecified erectile dysfunction type Struggled with ED for many years, current therapy not improving his symptoms, interested in exploring other options Referral to urology for further evaluation - Ambulatory referral to Urology  3. Encounter for screening for diabetic retinopathy Needs annual eye exam - Ambulatory referral to Ophthalmology  4. Mixed hyperlipidemia Levels have remained stable over the last year Discussed will allow him to work on lifestyle modifications for 6 months, recheck levels and if they remain abnormal discuss statin therapy at that time due to multiple underlying risk factors for CVA/MI  General Counseling: Callaghan verbalizes understanding of the findings of todays visit and agrees with plan of treatment. I have discussed any further diagnostic evaluation that may be needed or ordered today. We also reviewed his medications today. he has been encouraged to call the office with any questions or concerns that should arise related to todays visit.    Orders Placed This Encounter  Procedures  . Ambulatory referral to Urology  . Ambulatory referral to Ophthalmology  . Home sleep test      Time spent: 30 Minutes Time spent includes review of chart, medications, test results and follow-up plan with the patient.  This patient was seen by AGNP-C in Collaboration with Dr Leeanne Deed as a part of collaborative care agreement     Lyndon Code. Pilar Westergaard AGNP-C Internal medicine

## 2020-10-03 ENCOUNTER — Ambulatory Visit (INDEPENDENT_AMBULATORY_CARE_PROVIDER_SITE_OTHER): Payer: BC Managed Care – PPO | Admitting: Urology

## 2020-10-03 ENCOUNTER — Other Ambulatory Visit: Payer: Self-pay

## 2020-10-03 ENCOUNTER — Encounter: Payer: Self-pay | Admitting: Urology

## 2020-10-03 ENCOUNTER — Encounter: Payer: Self-pay | Admitting: Hospice and Palliative Medicine

## 2020-10-03 VITALS — BP 149/89 | HR 93 | Ht 67.0 in | Wt 202.0 lb

## 2020-10-03 DIAGNOSIS — R7989 Other specified abnormal findings of blood chemistry: Secondary | ICD-10-CM | POA: Diagnosis not present

## 2020-10-03 DIAGNOSIS — N529 Male erectile dysfunction, unspecified: Secondary | ICD-10-CM | POA: Diagnosis not present

## 2020-10-03 MED ORDER — TADALAFIL 20 MG PO TABS: 20.0000 mg | ORAL_TABLET | Freq: Every day | ORAL | 11 refills | Status: AC

## 2020-10-03 NOTE — Progress Notes (Signed)
   10/03/2020 4:25 PM   Melquisedec Journey Archambeau April 27, 1971 [id number]  Reason for visit: Follow-up ED, low testosterone  HPI: I saw Mr. Westerman in urology clinic for evaluation of ED.  He was recently seen by Dr. Apolinar Junes in May 2021 for ED, and unclear why he was put on my schedule today as a new patient.  He reports he had a long history of ED, and previously was on Trimix.  He reports that this only worked intermittently for him.  He has only tried sildenafil a few times reportedly, without good results.  He was recently seen at a men's clinic in [facility], and they recommended considering penile injections, but he did not follow-up with them.  Testosterone was reportedly low at that visit.  Testosterone was recently checked by his PCP and was again low at 283.  We discussed different treatment options for ED at length including PDE 5 inhibitors, penile injections, vacuum erection device, or penile prosthesis.  He has a number of comorbidities that likely contribute to his ED.  I recommended a trial of max dose daily Cialis 20 mg.  I also recommended follow-up in 3 to 4 weeks with Carollee Herter to discuss testosterone injections and see if combination of the daily Cialis and testosterone replacement give him adequate erections.  If not, will need to consider resuming penile injections, or even referral for penile prosthesis.   Sondra Come, MD  [facility] [address] [place], [place] [address] [phone]

## 2020-10-03 NOTE — Patient Instructions (Signed)
Erectile Dysfunction Erectile dysfunction (ED) is the inability to get or keep an erection in order to have sexual intercourse. Erectile dysfunction may include:  Inability to get an erection.  Lack of enough hardness of the erection to allow penetration.  Loss of the erection before sex is finished. What are the causes? This condition may be caused by:  Certain medicines, such as: ? Pain relievers. ? Antihistamines. ? Antidepressants. ? Blood pressure medicines. ? Water pills (diuretics). ? Ulcer medicines. ? Muscle relaxants. ? Drugs.  Excessive drinking.  Psychological causes, such as: ? Anxiety. ? Depression. ? Sadness. ? Exhaustion. ? Performance fear. ? Stress.  Physical causes, such as: ? Artery problems. This may include diabetes, smoking, liver disease, or atherosclerosis. ? High blood pressure. ? Hormonal problems, such as low testosterone. ? Obesity. ? Nerve problems. This may include back or pelvic injuries, diabetes mellitus, multiple sclerosis, or Parkinson disease. What are the signs or symptoms? Symptoms of this condition include:  Inability to get an erection.  Lack of enough hardness of the erection to allow penetration.  Loss of the erection before sex is finished.  Normal erections at some times, but with frequent unsatisfactory episodes.  Low sexual satisfaction in either partner due to erection problems.  A curved penis occurring with erection. The curve may cause pain or the penis may be too curved to allow for intercourse.  Never having nighttime erections. How is this diagnosed? This condition is often diagnosed by:  Performing a physical exam to find other diseases or specific problems with the penis.  Asking you detailed questions about the problem.  Performing blood tests to check for diabetes mellitus or to measure hormone levels.  Performing other tests to check for underlying health conditions.  Performing an ultrasound  exam to check for scarring.  Performing a test to check blood flow to the penis.  Doing a sleep study at home to measure nighttime erections. How is this treated? This condition may be treated by:  Medicine taken by mouth to help you achieve an erection (oral medicine).  Hormone replacement therapy to replace low testosterone levels.  Medicine that is injected into the penis. Your health care provider may instruct you how to give yourself these injections at home.  Vacuum pump. This is a pump with a ring on it. The pump and ring are placed on the penis and used to create pressure that helps the penis become erect.  Penile implant surgery. In this procedure, you may receive: ? An inflatable implant. This consists of cylinders, a pump, and a reservoir. The cylinders can be inflated with a fluid that helps to create an erection, and they can be deflated after intercourse. ? A semi-rigid implant. This consists of two silicone rubber rods. The rods provide some rigidity. They are also flexible, so the penis can both curve downward in its normal position and become straight for sexual intercourse.  Blood vessel surgery, to improve blood flow to the penis. During this procedure, a blood vessel from a different part of the body is placed into the penis to allow blood to flow around (bypass) damaged or blocked blood vessels.  Lifestyle changes, such as exercising more, losing weight, and quitting smoking. Follow these instructions at home: Medicines   Take over-the-counter and prescription medicines only as told by your health care provider. Do not increase the dosage without first discussing it with your health care provider.  If you are using self-injections, perform injections as directed by your   health care provider. Make sure to avoid any veins that are on the surface of the penis. After giving an injection, apply pressure to the injection site for 5 minutes. General  instructions  Exercise regularly, as directed by your health care provider. Work with your health care provider to lose weight, if needed.  Do not use any products that contain nicotine or tobacco, such as cigarettes and e-cigarettes. If you need help quitting, ask your health care provider.  Before using a vacuum pump, read the instructions that come with the pump and discuss any questions with your health care provider.  Keep all follow-up visits as told by your health care provider. This is important. Contact a health care provider if:  You feel nauseous.  You vomit. Get help right away if:  You are taking oral or injectable medicines and you have an erection that lasts longer than 4 hours. If your health care provider is unavailable, go to the nearest emergency room for evaluation. An erection that lasts much longer than 4 hours can result in permanent damage to your penis.  You have severe pain in your groin or abdomen.  You develop redness or severe swelling of your penis.  You have redness spreading up into your groin or lower abdomen.  You are unable to urinate.  You experience chest pain or a rapid heart beat (palpitations) after taking oral medicines. Summary  Erectile dysfunction (ED) is the inability to get or keep an erection during sexual intercourse. This problem can usually be treated successfully.  This condition is diagnosed based on a physical exam, your symptoms, and tests to determine the cause. Treatment varies depending on the cause, and may include medicines, hormone therapy, surgery, or vacuum pump.  You may need follow-up visits to make sure that you are using your medicines or devices correctly.  Get help right away if you are taking or injecting medicines and you have an erection that lasts longer than 4 hours. This information is not intended to replace advice given to you by your health care provider. Make sure you discuss any questions you have with  your health care provider. Document Revised: 09/27/2017 Document Reviewed: 10/31/2016 Elsevier Patient Education  2020 Elsevier Inc.  Tadalafil tablets (Cialis) What is this medicine? TADALAFIL (tah DA la fil) is used to treat erection problems in men. It is also used for enlargement of the prostate gland in men, a condition called benign prostatic hyperplasia or BPH. This medicine improves urine flow and reduces BPH symptoms. This medicine can also treat both erection problems and BPH when they occur together. This medicine may be used for other purposes; ask your health care provider or pharmacist if you have questions. COMMON BRAND NAME(S): Adcirca, ALYQ, Cialis What should I tell my health care provider before I take this medicine? They need to know if you have any of these conditions: bleeding disorders eye or vision problems, including a rare inherited eye disease called retinitis pigmentosa anatomical deformation of the penis, Peyronie's disease, or history of priapism (painful and prolonged erection) heart disease, angina, a history of heart attack, irregular heart beats, or other heart problems high or low blood pressure history of blood diseases, like sickle cell anemia or leukemia history of stomach bleeding kidney disease liver disease stroke an unusual or allergic reaction to tadalafil, other medicines, foods, dyes, or preservatives pregnant or trying to get pregnant breast-feeding How should I use this medicine? Take this medicine by mouth with a glass of water.   Follow the directions on the prescription label. You may take this medicine with or without meals. When this medicine is used for erection problems, your doctor may prescribe it to be taken once daily or as needed. If you are taking the medicine as needed, you may be able to have sexual activity 30 minutes after taking it and for up to 36 hours after taking it. Whether you are taking the medicine as needed or once  daily, you should not take more than one dose per day. If you are taking this medicine for symptoms of benign prostatic hyperplasia (BPH) or to treat both BPH and an erection problem, take the dose once daily at about the same time each day. Do not take your medicine more often than directed. Talk to your pediatrician regarding the use of this medicine in children. Special care may be needed. Overdosage: If you think you have taken too much of this medicine contact a poison control center or emergency room at once. NOTE: This medicine is only for you. Do not share this medicine with others. What if I miss a dose? If you are taking this medicine as needed for erection problems, this does not apply. If you miss a dose while taking this medicine once daily for an erection problem, benign prostatic hyperplasia, or both, take it as soon as you remember, but do not take more than one dose per day. What may interact with this medicine? Do not take this medicine with any of the following medications: nitrates like amyl nitrite, isosorbide dinitrate, isosorbide mononitrate, nitroglycerin other medicines for erectile dysfunction like avanafil, sildenafil, vardenafil other tadalafil products (Adcirca) riociguat This medicine may also interact with the following medications: certain drugs for high blood pressure certain drugs for the treatment of HIV infection or AIDS certain drugs used for fungal or yeast infections, like fluconazole, itraconazole, ketoconazole, and voriconazole certain drugs used for seizures like carbamazepine, phenytoin, and phenobarbital grapefruit juice macrolide antibiotics like clarithromycin, erythromycin, troleandomycin medicines for prostate problems rifabutin, rifampin or rifapentine This list may not describe all possible interactions. Give your health care provider a list of all the medicines, herbs, non-prescription drugs, or dietary supplements you use. Also tell them if you  smoke, drink alcohol, or use illegal drugs. Some items may interact with your medicine. What should I watch for while using this medicine? If you notice any changes in your vision while taking this drug, call your doctor or health care professional as soon as possible. Stop using this medicine and call your health care provider right away if you have a loss of sight in one or both eyes. Contact your doctor or health care professional right away if the erection lasts longer than 4 hours or if it becomes painful. This may be a sign of serious problem and must be treated right away to prevent permanent damage. If you experience symptoms of nausea, dizziness, chest pain or arm pain upon initiation of sexual activity after taking this medicine, you should refrain from further activity and call your doctor or health care professional as soon as possible. Do not drink alcohol to excess (examples, 5 glasses of wine or 5 shots of whiskey) when taking this medicine. When taken in excess, alcohol can increase your chances of getting a headache or getting dizzy, increasing your heart rate or lowering your blood pressure. Using this medicine does not protect you or your partner against HIV infection (the virus that causes AIDS) or other sexually transmitted diseases. What side   effects may I notice from receiving this medicine? Side effects that you should report to your doctor or health care professional as soon as possible: allergic reactions like skin rash, itching or hives, swelling of the face, lips, or tongue breathing problems changes in hearing changes in vision chest pain fast, irregular heartbeat prolonged or painful erection seizures Side effects that usually do not require medical attention (report to your doctor or health care professional if they continue or are bothersome): back pain dizziness flushing headache indigestion muscle aches nausea stuffy or runny nose This list may not describe  all possible side effects. Call your doctor for medical advice about side effects. You may report side effects to FDA at 1-800-FDA-1088. Where should I keep my medicine? Keep out of the reach of children. Store at room temperature between 15 and 30 degrees C (59 and 86 degrees F). Throw away any unused medicine after the expiration date. NOTE: This sheet is a summary. It may not cover all possible information. If you have questions about this medicine, talk to your doctor, pharmacist, or health care provider.  2020 Elsevier/Gold Standard (2014-03-05 13:15:49)  

## 2020-10-13 ENCOUNTER — Telehealth: Payer: Self-pay

## 2020-10-13 NOTE — Telephone Encounter (Signed)
Documents for Home sleep study completed in placed in [facility] behind [facility] for pick up

## 2020-10-31 NOTE — Progress Notes (Signed)
11/01/2020 9:10 PM   Ronnie Mejia November 27, 1970 [id number]  Referring provider: Theotis Burrow, NP [address] [place],  [place] [address]  Chief Complaint  Patient presents with  . Erectile Dysfunction    HPI: [name] is a 50 year old male with ED who presents today for follow up.  He has been seen by Dr. Apolinar Mejia and Dr. Richardo Mejia for this issue in the last year.  He had been using ICI but with equivocal results.   He was started on Cialis 20 mg on demand dosing most recently.    Erectile dysfunction SHIM score: 7   Main complaint: Erections not firm enough for penetration x 3 years  Risk factors:  age, DM, HTN, HLD and sleep apnea No painful erections or curvatures with his erections.    Starting having spontaneous erections with Cialis 20 mg daily Tried:    ICI - effective at first    [facility]    Row Name 11/01/20 1312         SHIM: Over the last 6 months:   How do you rate your confidence that you could get and keep an erection? Very Low     When you had erections with sexual stimulation, how often were your erections hard enough for penetration (entering your partner)? A Few Times (much less than half the time)     During sexual intercourse, how often were you able to maintain your erection after you had penetrated (entered) your partner? Almost Never or Never     During sexual intercourse, how difficult was it to maintain your erection to completion of intercourse? Extremely Difficult     When you attempted sexual intercourse, how often was it satisfactory for you? A Few Times (much less than half the time)           SHIM Total Score   SHIM 7            Score: 1-7 Severe ED 8-11 Moderate ED 12-16 Mild-Moderate ED 17-21 Mild ED 22-25 No ED  PMH: Past Medical History:  Diagnosis Date  . Anemia   . Arthritis   . Complication of anesthesia    PT WAS SHEDULED TO HAVE IGSS AT [facility] ON 10-06-15 AND AFTER INDUCTION PT ASPIRATED SCANT AMOUNT OF  GASTRIC CONTENTS AND CASE WAS CANCELLED  . Deviated septum   . Diabetes mellitus (HCC)    on metformin  . GERD (gastroesophageal reflux disease)   . Hoarseness of voice    SINCE BEING INTUBATED ON 10-06-15  . Hypertension    PT DENIES DURING 10-14-15 PHONE INTERVIEW   . Nasal congestion    LONG TERM OBSTRUCTION/ENLARGED TURBINATES  . Pancreatitis 05/2014  . Pancreatitis   . PONV (postoperative nausea and vomiting)   . Sleep apnea     had surgery no longer uses cpap    Surgical History: Past Surgical History:  Procedure Laterality Date  . ESOPHAGOGASTRODUODENOSCOPY (EGD) WITH PROPOFOL N/A 02/28/2018   Procedure: ESOPHAGOGASTRODUODENOSCOPY (EGD) WITH PROPOFOL;  Surgeon: Ronnie Hawking, MD;  Location: [facility];  Service: Endoscopy;  Laterality: N/A;  . ESOPHAGOGASTRODUODENOSCOPY (EGD) WITH PROPOFOL N/A 04/18/2018   Procedure: ESOPHAGOGASTRODUODENOSCOPY (EGD) WITH PROPOFOL;  Surgeon: Ronnie Hawking, MD;  Location: [facility];  Service: Endoscopy;  Laterality: N/A;  . HOT HEMOSTASIS N/A 02/28/2018   Procedure: HOT HEMOSTASIS (ARGON PLASMA COAGULATION/BICAP);  Surgeon: Ronnie Hawking, MD;  Location: [facility];  Service: Endoscopy;  Laterality: N/A;  . HOT HEMOSTASIS N/A 04/18/2018  Procedure: HOT HEMOSTASIS (ARGON PLASMA COAGULATION/BICAP);  Surgeon: Ronnie Ada, MD;  Location: [facility];  Service: Endoscopy;  Laterality: N/A;  . NASAL SEPTOPLASTY W/ TURBINOPLASTY Bilateral 10/17/2015   Procedure: Nasal Septoplasty, Bilateral Partial Reduction Inferior Turbinates ;  Surgeon: Ronnie Sheffield, MD;  Location: [facility];  Service: ENT;  Laterality: Bilateral;  . SHOULDER SURGERY Right    ARTHROSCOPIC  . WISDOM TOOTH EXTRACTION      Home Medications:  Allergies as of 11/01/2020   No Known Allergies     Medication List       Accurate as of November 01, 2020 11:59 PM. If you have any questions, ask your nurse or doctor.        Accu-Chek FastClix Lancets Misc Use as directed twice  daily diag E11.65   Accu-Chek Guide test strip Generic drug: glucose blood 1 each by Other route 2 (two) times daily. Use as instructed twice a daily e11.65   dapagliflozin propanediol 10 MG Tabs tablet Commonly known as: Farxiga Take 1 tablet (10 mg total) by mouth daily.   glimepiride 2 MG tablet Commonly known as: AMARYL Take 1 tablet (2 mg total) by mouth 2 (two) times daily.   Janumet XR [phone] MG Tb24 Generic drug: SitaGLIPtin-MetFORMIN HCl Take 1 tablet by mouth daily at 12 noon.   pantoprazole 40 MG tablet Commonly known as: PROTONIX Take 1 tablet (40 mg total) by mouth daily.   sildenafil 20 MG tablet Commonly known as: Revatio Take 1 tablet (20 mg total) by mouth as needed. Take 1-5 tabs as needed prior to intercourse   tadalafil 20 MG tablet Commonly known as: CIALIS Take 1 tablet (20 mg total) by mouth daily.       Allergies: No Known Allergies  Family History: Family History  Problem Relation Age of Onset  . Diabetes Mother   . Hypertension Mother   . Diabetes Father   . Cancer Other        paternal aunt    Social History:  reports that he has never smoked. He has never used smokeless tobacco. He reports current alcohol use. He reports that he does not use drugs.  ROS: Pertinent ROS in HPI  Physical Exam: BP (!) 141/90 (BP Location: Left Arm, Patient Position: Sitting, Cuff Size: Normal)   Pulse 90   Ht 5\' 7"  (1.702 m)   Wt 203 lb (92.1 kg)   BMI 31.79 kg/m   Constitutional:  Well nourished. Alert and oriented, No acute distress. HEENT:  AT, mask in place.  Trachea midline, no masses. Cardiovascular: No clubbing, cyanosis, or edema. Respiratory: Normal respiratory effort, no increased work of breathing. Neurologic: Grossly intact, no focal deficits, moving all 4 extremities. Psychiatric: Normal mood and affect.  Laboratory Data: Lab Results  Component Value Date   WBC 5.0 09/20/2020   HGB 16.4 09/20/2020   HCT 49.0 09/20/2020    MCV 85 09/20/2020   PLT 176 09/20/2020    Lab Results  Component Value Date   CREATININE 0.97 09/20/2020    Lab Results  Component Value Date   TESTOSTERONE 249 (L) 11/03/2020    Lab Results  Component Value Date   HGBA1C 6.5 (A) 08/15/2020    Lab Results  Component Value Date   TSH 2.450 09/20/2020       Component Value Date/Time   CHOL 188 09/20/2020 0812   HDL 31 (L) 09/20/2020 0812   CHOLHDL 4.5 06/16/2014 0835   VLDL 30 06/16/2014 0835   LDLCALC 128 (H)  09/20/2020 4847    Lab Results  Component Value Date   AST 21 09/20/2020   Lab Results  Component Value Date   ALT 26 09/20/2020   Component     Latest Ref Rng & Units 09/20/2020  Prolactin     4.0 - 15.2 ng/mL 12.7   I have reviewed the labs.   Pertinent Imaging: No recent imaging  Assessment & Plan:   1. Erectile dysfunction  - SHIM score is 7  - we will obtain a second serum testosterone level at this time; if it is abnormal we will start testosterone therapy  - Continue tadalafil 20 mg daily  - RTC pending testosterone results   Return for pending testosterone results .  These notes generated with voice recognition software. I apologize for typographical errors.  Michiel Cowboy, PA-C  [facility] [address]  [address] [place], [place] [address] [phone]

## 2020-11-01 ENCOUNTER — Ambulatory Visit: Payer: BLUE CROSS/BLUE SHIELD | Admitting: Urology

## 2020-11-01 ENCOUNTER — Other Ambulatory Visit: Payer: Self-pay

## 2020-11-01 ENCOUNTER — Encounter: Payer: Self-pay | Admitting: Urology

## 2020-11-01 VITALS — BP 141/90 | HR 90 | Ht 67.0 in | Wt 203.0 lb

## 2020-11-01 DIAGNOSIS — N529 Male erectile dysfunction, unspecified: Secondary | ICD-10-CM

## 2020-11-02 ENCOUNTER — Other Ambulatory Visit: Payer: Self-pay | Admitting: *Deleted

## 2020-11-02 DIAGNOSIS — R7989 Other specified abnormal findings of blood chemistry: Secondary | ICD-10-CM

## 2020-11-03 ENCOUNTER — Other Ambulatory Visit: Payer: Self-pay

## 2020-11-03 ENCOUNTER — Other Ambulatory Visit: Payer: BLUE CROSS/BLUE SHIELD

## 2020-11-03 DIAGNOSIS — R7989 Other specified abnormal findings of blood chemistry: Secondary | ICD-10-CM

## 2020-11-04 ENCOUNTER — Telehealth: Payer: Self-pay | Admitting: Family Medicine

## 2020-11-04 ENCOUNTER — Telehealth: Payer: Self-pay

## 2020-11-04 DIAGNOSIS — Z03818 Encounter for observation for suspected exposure to other biological agents ruled out: Secondary | ICD-10-CM | POA: Diagnosis not present

## 2020-11-04 DIAGNOSIS — Z20822 Contact with and (suspected) exposure to covid-19: Secondary | ICD-10-CM | POA: Diagnosis not present

## 2020-11-04 LAB — TESTOSTERONE: Testosterone: 249 ng/dL — ABNORMAL LOW (ref 264–916)

## 2020-11-04 LAB — PSA: Prostate Specific Ag, Serum: 0.5 ng/mL (ref 0.0–4.0)

## 2020-11-04 NOTE — Telephone Encounter (Signed)
Spoke to patient's wife and she will have him call back before anything is done. I have not sent in Clomid or scheduled and appointment yet.

## 2020-11-04 NOTE — Telephone Encounter (Signed)
-----   Message from Harle Battiest, PA-C sent at 11/04/2020  8:08 AM EST ----- Please let Ronnie Mejia know that his testosterone level remains below 300 and his PSA was normal.  We can start Clomid 50 mg, 1/2 daily, # 30 at this time.  We will need to check his testosterone again before 10 am in one month.

## 2020-11-04 NOTE — Telephone Encounter (Signed)
Pt wife called asking where to get covid tested. Advised to try [facility] or an urgent care.

## 2020-11-10 MED ORDER — CLOMIPHENE CITRATE 50 MG PO TABS: ORAL_TABLET | ORAL | 0 refills | Status: DC

## 2020-11-10 NOTE — Telephone Encounter (Signed)
Patient notified and RX sent to pharmacy. Lab appointment scheduled.

## 2020-11-30 ENCOUNTER — Ambulatory Visit (INDEPENDENT_AMBULATORY_CARE_PROVIDER_SITE_OTHER): Payer: BC Managed Care – PPO | Admitting: Hospice and Palliative Medicine

## 2020-11-30 ENCOUNTER — Other Ambulatory Visit: Payer: Self-pay

## 2020-11-30 ENCOUNTER — Other Ambulatory Visit: Payer: Self-pay | Admitting: Adult Health

## 2020-11-30 ENCOUNTER — Encounter: Payer: Self-pay | Admitting: Hospice and Palliative Medicine

## 2020-11-30 VITALS — BP 128/86 | HR 98 | Temp 97.7°F | Resp 16 | Ht 67.0 in | Wt 200.6 lb

## 2020-11-30 DIAGNOSIS — G4733 Obstructive sleep apnea (adult) (pediatric): Secondary | ICD-10-CM

## 2020-11-30 DIAGNOSIS — E1165 Type 2 diabetes mellitus with hyperglycemia: Secondary | ICD-10-CM

## 2020-11-30 DIAGNOSIS — N529 Male erectile dysfunction, unspecified: Secondary | ICD-10-CM

## 2020-11-30 DIAGNOSIS — K219 Gastro-esophageal reflux disease without esophagitis: Secondary | ICD-10-CM

## 2020-11-30 DIAGNOSIS — E782 Mixed hyperlipidemia: Secondary | ICD-10-CM | POA: Diagnosis not present

## 2020-11-30 LAB — POCT GLYCOSYLATED HEMOGLOBIN (HGB A1C): Hemoglobin A1C: 6.6 % — AB (ref 4.0–5.6)

## 2020-11-30 MED ORDER — GLIMEPIRIDE 2 MG PO TABS: 2.0000 mg | ORAL_TABLET | Freq: Two times a day (BID) | ORAL | 1 refills | Status: DC

## 2020-11-30 MED ORDER — DAPAGLIFLOZIN PROPANEDIOL 10 MG PO TABS: 10.0000 mg | ORAL_TABLET | Freq: Every day | ORAL | 1 refills | Status: DC

## 2020-11-30 NOTE — Progress Notes (Signed)
[facility] [address] [place], [place] [address]  Internal MEDICINE  Office Visit Note  Patient Name: Ronnie Mejia  [id number]  [id number]  Date of Service: 12/03/2020  Chief Complaint  Patient presents with  . Follow-up  . Hypertension  . Sleep Apnea  . Gastroesophageal Reflux  . Diabetes  . Anemia  . Quality Metric Gaps    Colonoscopy, eye exam scheduled     HPI Patient is here for routine follow-up Has been seen by urology for ED and low testosterone--started on Cialis 20 mg daily as well as a trial of Clomid to see how his symptoms respond  Has an appt with eye doctor later this summer as this was there first available appointment  DM-glucose levels have been stable  Has not been contacted to set up his home sleep study--history of OSA  Current Medication: Outpatient Encounter Medications as of 11/30/2020  Medication Sig  . Accu-Chek FastClix Lancets MISC Use as directed twice daily diag E11.65  . clomiPHENE (CLOMID) 50 MG tablet Take 1/2 tablet daily.  . dapagliflozin propanediol (FARXIGA) 10 MG TABS tablet Take 1 tablet (10 mg total) by mouth daily.  Marland Kitchen glimepiride (AMARYL) 2 MG tablet Take 1 tablet (2 mg total) by mouth 2 (two) times daily.  Marland Kitchen glucose blood (ACCU-CHEK GUIDE) test strip 1 each by Other route 2 (two) times daily. Use as instructed twice a daily e11.65  . pantoprazole (PROTONIX) 40 MG tablet Take 1 tablet (40 mg total) by mouth daily.  . SitaGLIPtin-MetFORMIN HCl (JANUMET XR) [phone] MG TB24 Take 1 tablet by mouth daily at 12 noon.  . tadalafil (CIALIS) 20 MG tablet Take 1 tablet (20 mg total) by mouth daily.  . [DISCONTINUED] dapagliflozin propanediol (FARXIGA) 10 MG TABS tablet Take 1 tablet (10 mg total) by mouth daily.  . [DISCONTINUED] glimepiride (AMARYL) 2 MG tablet Take 1 tablet (2 mg total) by mouth 2 (two) times daily.  . [DISCONTINUED] sildenafil (REVATIO) 20 MG tablet Take 1 tablet (20 mg total) by mouth as needed. Take 1-5  tabs as needed prior to intercourse   No facility-administered encounter medications on file as of 11/30/2020.    Surgical History: Past Surgical History:  Procedure Laterality Date  . ESOPHAGOGASTRODUODENOSCOPY (EGD) WITH PROPOFOL N/A 02/28/2018   Procedure: ESOPHAGOGASTRODUODENOSCOPY (EGD) WITH PROPOFOL;  Surgeon: Jeani Hawking, MD;  Location: [facility];  Service: Endoscopy;  Laterality: N/A;  . ESOPHAGOGASTRODUODENOSCOPY (EGD) WITH PROPOFOL N/A 04/18/2018   Procedure: ESOPHAGOGASTRODUODENOSCOPY (EGD) WITH PROPOFOL;  Surgeon: Jeani Hawking, MD;  Location: [facility];  Service: Endoscopy;  Laterality: N/A;  . HOT HEMOSTASIS N/A 02/28/2018   Procedure: HOT HEMOSTASIS (ARGON PLASMA COAGULATION/BICAP);  Surgeon: Jeani Hawking, MD;  Location: [facility];  Service: Endoscopy;  Laterality: N/A;  . HOT HEMOSTASIS N/A 04/18/2018   Procedure: HOT HEMOSTASIS (ARGON PLASMA COAGULATION/BICAP);  Surgeon: Jeani Hawking, MD;  Location: [facility];  Service: Endoscopy;  Laterality: N/A;  . NASAL SEPTOPLASTY W/ TURBINOPLASTY Bilateral 10/17/2015   Procedure: Nasal Septoplasty, Bilateral Partial Reduction Inferior Turbinates ;  Surgeon: Vernie Murders, MD;  Location: [facility];  Service: ENT;  Laterality: Bilateral;  . SHOULDER SURGERY Right    ARTHROSCOPIC  . WISDOM TOOTH EXTRACTION      Medical History: Past Medical History:  Diagnosis Date  . Anemia   . Arthritis   . Complication of anesthesia    PT WAS SHEDULED TO HAVE IGSS AT [facility] ON 10-06-15 AND AFTER INDUCTION PT ASPIRATED SCANT AMOUNT OF GASTRIC CONTENTS  AND CASE WAS CANCELLED  . Deviated septum   . Diabetes mellitus (HCC)    on metformin  . GERD (gastroesophageal reflux disease)   . Hoarseness of voice    SINCE BEING INTUBATED ON 10-06-15  . Hypertension    PT DENIES DURING 10-14-15 PHONE INTERVIEW   . Nasal congestion    LONG TERM OBSTRUCTION/ENLARGED TURBINATES  . Pancreatitis 05/2014  . Pancreatitis   . PONV  (postoperative nausea and vomiting)   . Sleep apnea     had surgery no longer uses cpap    Family History: Family History  Problem Relation Age of Onset  . Diabetes Mother   . Hypertension Mother   . Diabetes Father   . Cancer Other        paternal aunt    Social History   Socioeconomic History  . Marital status: Married    Spouse name: Not on file  . Number of children: Not on file  . Years of education: Not on file  . Highest education level: Not on file  Occupational History  . Occupation: Sports administrator: Sports coach  Tobacco Use  . Smoking status: Never Smoker  . Smokeless tobacco: Never Used  Vaping Use  . Vaping Use: Never used  Substance and Sexual Activity  . Alcohol use: Yes    Comment: occasional  . Drug use: No  . Sexual activity: Yes  Other Topics Concern  . Not on file  Social History Narrative  . Not on file   Social Determinants of Health   Financial Resource Strain: Not on file  Food Insecurity: Not on file  Transportation Needs: Not on file  Physical Activity: Not on file  Stress: Not on file  Social Connections: Not on file  Intimate Partner Violence: Not on file      Review of Systems  Constitutional: Negative for chills, fatigue and unexpected weight change.  HENT: Negative for congestion, postnasal drip, rhinorrhea, sneezing and sore throat.   Eyes: Negative for redness.  Respiratory: Negative for cough, chest tightness and shortness of breath.   Cardiovascular: Negative for chest pain and palpitations.  Gastrointestinal: Negative for abdominal pain, constipation, diarrhea, nausea and vomiting.  Genitourinary: Negative for dysuria and frequency.  Musculoskeletal: Negative for arthralgias, back pain, joint swelling and neck pain.  Skin: Negative for rash.  Neurological: Negative for tremors and numbness.  Hematological: Negative for adenopathy. Does not bruise/bleed easily.  Psychiatric/Behavioral: Negative for behavioral  problems (Depression), sleep disturbance and suicidal ideas. The patient is not nervous/anxious.     Vital Signs: BP 128/86   Pulse 98   Temp 97.7 F (36.5 C)   Resp 16   Ht 5\' 7"  (1.702 m)   Wt 200 lb 9.6 oz (91 kg)   SpO2 97%   BMI 31.42 kg/m    Physical Exam Vitals reviewed.  Constitutional:      Appearance: Normal appearance. He is obese.  Cardiovascular:     Rate and Rhythm: Normal rate and regular rhythm.     Pulses: Normal pulses.     Heart sounds: Normal heart sounds.  Pulmonary:     Effort: Pulmonary effort is normal.     Breath sounds: Normal breath sounds.  Abdominal:     General: Abdomen is flat.     Palpations: Abdomen is soft.  Musculoskeletal:        General: Normal range of motion.     Cervical back: Normal range of motion.  Skin:  General: Skin is warm.  Neurological:     General: No focal deficit present.     Mental Status: He is alert and oriented to person, place, and time. Mental status is at baseline.  Psychiatric:        Mood and Affect: Mood normal.        Behavior: Behavior normal.        Thought Content: Thought content normal.        Judgment: Judgment normal.    Assessment/Plan: 1. Type 2 diabetes mellitus with hyperglycemia, without long-term current use of insulin (HCC) A1C well controlled at 6.6--continue with current therapy--requesting refills - POCT HgB A1C - dapagliflozin propanediol (FARXIGA) 10 MG TABS tablet; Take 1 tablet (10 mg total) by mouth daily.  Dispense: 90 tablet; Refill: 1 - glimepiride (AMARYL) 2 MG tablet; Take 1 tablet (2 mg total) by mouth 2 (two) times daily.  Dispense: 180 tablet; Refill: 1  2. OSA (obstructive sleep apnea) Has not been set up for HST, not interested in PSG at this time Will proceed with ONO--based on results will then further discuss importance of PSG - Pulse oximetry, overnight; Future  3. Erectile dysfunction, unspecified erectile dysfunction type Followed and managed by  urology  4. Mixed hyperlipidemia Need to discuss statin therapy and further vascular studies  General Counseling: Delmar verbalizes understanding of the findings of todays visit and agrees with plan of treatment. I have discussed any further diagnostic evaluation that may be needed or ordered today. We also reviewed his medications today. he has been encouraged to call the office with any questions or concerns that should arise related to todays visit.    Orders Placed This Encounter  Procedures  . Pulse oximetry, overnight  . POCT HgB A1C    Meds ordered this encounter  Medications  . dapagliflozin propanediol (FARXIGA) 10 MG TABS tablet    Sig: Take 1 tablet (10 mg total) by mouth daily.    Dispense:  90 tablet    Refill:  1  . glimepiride (AMARYL) 2 MG tablet    Sig: Take 1 tablet (2 mg total) by mouth 2 (two) times daily.    Dispense:  180 tablet    Refill:  1    Time spent: 30 Minutes Time spent includes review of chart, medications, test results and follow-up plan with the patient.  This patient was seen by Leeanne Deed AGNP-C in Collaboration with Dr Lyndon Code as a part of collaborative care agreement     Lubertha Basque. Shahana Capes AGNP-C Internal medicine

## 2020-12-01 ENCOUNTER — Ambulatory Visit: Payer: BC Managed Care – PPO | Admitting: Hospice and Palliative Medicine

## 2020-12-03 ENCOUNTER — Encounter: Payer: Self-pay | Admitting: Hospice and Palliative Medicine

## 2020-12-06 ENCOUNTER — Other Ambulatory Visit: Payer: Self-pay | Admitting: Family Medicine

## 2020-12-06 DIAGNOSIS — R7989 Other specified abnormal findings of blood chemistry: Secondary | ICD-10-CM

## 2020-12-07 ENCOUNTER — Other Ambulatory Visit: Payer: BLUE CROSS/BLUE SHIELD

## 2020-12-07 ENCOUNTER — Other Ambulatory Visit: Payer: Self-pay

## 2020-12-07 DIAGNOSIS — R7989 Other specified abnormal findings of blood chemistry: Secondary | ICD-10-CM

## 2020-12-08 ENCOUNTER — Telehealth: Payer: Self-pay | Admitting: Family Medicine

## 2020-12-08 LAB — TESTOSTERONE: Testosterone: 416 ng/dL (ref 264–916)

## 2020-12-08 NOTE — Telephone Encounter (Signed)
Patient notified and appointment has been made.  

## 2020-12-08 NOTE — Telephone Encounter (Signed)
-----   Message from Harle Battiest, PA-C sent at 12/08/2020  7:55 AM EST ----- Please let [name] know that his testosterone has increased to 416.  I would like to repeat his blood work in three months with a morning testosterone, HCT, hgb and LFT's.

## 2020-12-20 DIAGNOSIS — M25421 Effusion, right elbow: Secondary | ICD-10-CM | POA: Diagnosis not present

## 2020-12-20 DIAGNOSIS — W1789XA Other fall from one level to another, initial encounter: Secondary | ICD-10-CM | POA: Diagnosis not present

## 2020-12-20 DIAGNOSIS — M7989 Other specified soft tissue disorders: Secondary | ICD-10-CM | POA: Diagnosis not present

## 2020-12-30 DIAGNOSIS — M7021 Olecranon bursitis, right elbow: Secondary | ICD-10-CM | POA: Diagnosis not present

## 2021-01-13 DIAGNOSIS — M7021 Olecranon bursitis, right elbow: Secondary | ICD-10-CM | POA: Diagnosis not present

## 2021-03-01 ENCOUNTER — Ambulatory Visit: Payer: 59 | Admitting: Hospice and Palliative Medicine

## 2021-03-01 ENCOUNTER — Encounter: Payer: Self-pay | Admitting: Hospice and Palliative Medicine

## 2021-03-01 ENCOUNTER — Other Ambulatory Visit: Payer: Self-pay

## 2021-03-01 VITALS — BP 134/88 | HR 97 | Temp 97.4°F | Resp 16 | Ht 67.0 in | Wt 199.0 lb

## 2021-03-01 DIAGNOSIS — E782 Mixed hyperlipidemia: Secondary | ICD-10-CM

## 2021-03-01 DIAGNOSIS — N529 Male erectile dysfunction, unspecified: Secondary | ICD-10-CM | POA: Diagnosis not present

## 2021-03-01 DIAGNOSIS — E1165 Type 2 diabetes mellitus with hyperglycemia: Secondary | ICD-10-CM

## 2021-03-01 LAB — POCT GLYCOSYLATED HEMOGLOBIN (HGB A1C): Hemoglobin A1C: 7.8 % — AB (ref 4.0–5.6)

## 2021-03-01 MED ORDER — JANUMET XR 100-1000 MG PO TB24
1.0000 | ORAL_TABLET | Freq: Every day | ORAL | 2 refills | Status: DC
Start: 2021-03-01 — End: 2021-11-06

## 2021-03-01 NOTE — Progress Notes (Signed)
[facility] [address] [place], [place] [address]  Internal MEDICINE  Office Visit Note  Patient Name: Ronnie Mejia  [id number]  [id number]  Date of Service: 03/02/2021  Chief Complaint  Patient presents with  . Follow-up    Refill request diabetic med  . Diabetes  . Hypertension  . Gastroesophageal Reflux  . Anemia  . Quality Metric Gaps    Colonoscopy done about 3 years ago    HPI Patient is here for routine follow-up DM-has been eating poorly, feels that his A1C is likely high today, will occasionally miss doses of his medications Has been seen by urologist for erectile dysfunction, given short course of Clomid--has completed script, scheduled for follow-up next week Scheduled to have his diabetic eye exam--unsure as to when it is   Current Medication: Outpatient Encounter Medications as of 03/01/2021  Medication Sig  . Accu-Chek FastClix Lancets MISC Use as directed twice daily diag E11.65  . clomiPHENE (CLOMID) 50 MG tablet Take 1/2 tablet daily.  . dapagliflozin propanediol (FARXIGA) 10 MG TABS tablet Take 1 tablet (10 mg total) by mouth daily.  Marland Kitchen glimepiride (AMARYL) 2 MG tablet Take 1 tablet (2 mg total) by mouth 2 (two) times daily.  Marland Kitchen glucose blood (ACCU-CHEK GUIDE) test strip 1 each by Other route 2 (two) times daily. Use as instructed twice a daily e11.65  . pantoprazole (PROTONIX) 40 MG tablet Take 1 tablet (40 mg total) by mouth daily.  . tadalafil (CIALIS) 20 MG tablet Take 1 tablet (20 mg total) by mouth daily.  . [DISCONTINUED] SitaGLIPtin-MetFORMIN HCl (JANUMET XR) [phone] MG TB24 Take 1 tablet by mouth daily at 12 noon.  . SitaGLIPtin-MetFORMIN HCl (JANUMET XR) [phone] MG TB24 Take 1 tablet by mouth daily at 12 noon.   No facility-administered encounter medications on file as of 03/01/2021.    Surgical History: Past Surgical History:  Procedure Laterality Date  . ESOPHAGOGASTRODUODENOSCOPY (EGD) WITH PROPOFOL N/A 02/28/2018   Procedure:  ESOPHAGOGASTRODUODENOSCOPY (EGD) WITH PROPOFOL;  Surgeon: Jeani Hawking, MD;  Location: [facility];  Service: Endoscopy;  Laterality: N/A;  . ESOPHAGOGASTRODUODENOSCOPY (EGD) WITH PROPOFOL N/A 04/18/2018   Procedure: ESOPHAGOGASTRODUODENOSCOPY (EGD) WITH PROPOFOL;  Surgeon: Jeani Hawking, MD;  Location: [facility];  Service: Endoscopy;  Laterality: N/A;  . HOT HEMOSTASIS N/A 02/28/2018   Procedure: HOT HEMOSTASIS (ARGON PLASMA COAGULATION/BICAP);  Surgeon: Jeani Hawking, MD;  Location: [facility];  Service: Endoscopy;  Laterality: N/A;  . HOT HEMOSTASIS N/A 04/18/2018   Procedure: HOT HEMOSTASIS (ARGON PLASMA COAGULATION/BICAP);  Surgeon: Jeani Hawking, MD;  Location: [facility];  Service: Endoscopy;  Laterality: N/A;  . NASAL SEPTOPLASTY W/ TURBINOPLASTY Bilateral 10/17/2015   Procedure: Nasal Septoplasty, Bilateral Partial Reduction Inferior Turbinates ;  Surgeon: Vernie Murders, MD;  Location: [facility];  Service: ENT;  Laterality: Bilateral;  . SHOULDER SURGERY Right    ARTHROSCOPIC  . WISDOM TOOTH EXTRACTION      Medical History: Past Medical History:  Diagnosis Date  . Anemia   . Arthritis   . Complication of anesthesia    PT WAS SHEDULED TO HAVE IGSS AT [facility] ON 10-06-15 AND AFTER INDUCTION PT ASPIRATED SCANT AMOUNT OF GASTRIC CONTENTS AND CASE WAS CANCELLED  . Deviated septum   . Diabetes mellitus (HCC)    on metformin  . GERD (gastroesophageal reflux disease)   . Hoarseness of voice    SINCE BEING INTUBATED ON 10-06-15  . Hypertension    PT DENIES DURING 10-14-15 PHONE INTERVIEW   . Nasal  congestion    LONG TERM OBSTRUCTION/ENLARGED TURBINATES  . Pancreatitis 05/2014  . Pancreatitis   . PONV (postoperative nausea and vomiting)   . Sleep apnea     had surgery no longer uses cpap    Family History: Family History  Problem Relation Age of Onset  . Diabetes Mother   . Hypertension Mother   . Diabetes Father   . Cancer Other        paternal aunt     Social History   Socioeconomic History  . Marital status: Married    Spouse name: Not on file  . Number of children: Not on file  . Years of education: Not on file  . Highest education level: Not on file  Occupational History  . Occupation: Sports administrator: Sports coach  Tobacco Use  . Smoking status: Never Smoker  . Smokeless tobacco: Never Used  Vaping Use  . Vaping Use: Never used  Substance and Sexual Activity  . Alcohol use: Yes    Comment: occasional  . Drug use: No  . Sexual activity: Yes  Other Topics Concern  . Not on file  Social History Narrative  . Not on file   Social Determinants of Health   Financial Resource Strain: Not on file  Food Insecurity: Not on file  Transportation Needs: Not on file  Physical Activity: Not on file  Stress: Not on file  Social Connections: Not on file  Intimate Partner Violence: Not on file      Review of Systems  Constitutional: Negative for chills, fatigue and unexpected weight change.  HENT: Negative for congestion, postnasal drip, rhinorrhea, sneezing and sore throat.   Eyes: Negative for redness.  Respiratory: Negative for cough, chest tightness and shortness of breath.   Cardiovascular: Negative for chest pain and palpitations.  Gastrointestinal: Negative for abdominal pain, constipation, diarrhea, nausea and vomiting.  Genitourinary: Negative for dysuria and frequency.  Musculoskeletal: Negative for arthralgias, back pain, joint swelling and neck pain.  Skin: Negative for rash.  Neurological: Negative for tremors and numbness.  Hematological: Negative for adenopathy. Does not bruise/bleed easily.  Psychiatric/Behavioral: Negative for behavioral problems (Depression), sleep disturbance and suicidal ideas. The patient is not nervous/anxious.     Vital Signs: BP 134/88   Pulse 97   Temp (!) 97.4 F (36.3 C)   Resp 16   Ht 5\' 7"  (1.702 m)   Wt 199 lb (90.3 kg)   SpO2 97%   BMI 31.17 kg/m    Physical  Exam Vitals reviewed.  Constitutional:      Appearance: Normal appearance. He is normal weight.  Cardiovascular:     Rate and Rhythm: Normal rate and regular rhythm.     Pulses: Normal pulses.     Heart sounds: Normal heart sounds.  Pulmonary:     Effort: Pulmonary effort is normal.     Breath sounds: Normal breath sounds.  Abdominal:     General: Abdomen is flat.     Palpations: Abdomen is soft.  Musculoskeletal:        General: Normal range of motion.     Cervical back: Normal range of motion.  Skin:    General: Skin is warm.  Neurological:     General: No focal deficit present.     Mental Status: He is alert and oriented to person, place, and time. Mental status is at baseline.  Psychiatric:        Mood and Affect: Mood normal.  Behavior: Behavior normal.        Thought Content: Thought content normal.        Judgment: Judgment normal.    Assessment/Plan: 1. Type 2 diabetes mellitus with hyperglycemia, without long-term current use of insulin (HCC) A1C elevated at 7.8--typically well controlled Discussed importance of glucose control, encouraged to take medications daily and being aware of healthy lifestyle choices such as healthy eating habits and increasing physical activity - POCT HgB A1C - SitaGLIPtin-MetFORMIN HCl (JANUMET XR) [phone] MG TB24; Take 1 tablet by mouth daily at 12 noon.  Dispense: 90 tablet; Refill: 2  2. Mixed hyperlipidemia Repeat levels, discussed initiating statin therapy if remains abnormal, high risk due to T2DM and possible HRT replacement therapy - Lipid Panel With LDL/HDL Ratio  3. Erectile dysfunction, unspecified erectile dysfunction type Followed and managed by urology, scheduled for upcoming office visit  General Counseling: Jalene verbalizes understanding of the findings of todays visit and agrees with plan of treatment. I have discussed any further diagnostic evaluation that may be needed or ordered today. We also reviewed his  medications today. he has been encouraged to call the office with any questions or concerns that should arise related to todays visit.    Orders Placed This Encounter  Procedures  . Lipid Panel With LDL/HDL Ratio  . POCT HgB A1C    Meds ordered this encounter  Medications  . SitaGLIPtin-MetFORMIN HCl (JANUMET XR) [phone] MG TB24    Sig: Take 1 tablet by mouth daily at 12 noon.    Dispense:  90 tablet    Refill:  2    Time spent: 30 Minutes Time spent includes review of chart, medications, test results and follow-up plan with the patient.  This patient was seen by Leeanne Deed AGNP-C in Collaboration with Dr Lyndon Code as a part of collaborative care agreement     Lubertha Basque. Lalo Tromp AGNP-C Internal medicine

## 2021-03-02 ENCOUNTER — Encounter: Payer: Self-pay | Admitting: Hospice and Palliative Medicine

## 2021-03-15 ENCOUNTER — Other Ambulatory Visit: Payer: Self-pay

## 2021-03-15 ENCOUNTER — Other Ambulatory Visit: Payer: BLUE CROSS/BLUE SHIELD

## 2021-03-15 ENCOUNTER — Other Ambulatory Visit: Payer: Self-pay | Admitting: Family Medicine

## 2021-03-15 DIAGNOSIS — R7989 Other specified abnormal findings of blood chemistry: Secondary | ICD-10-CM

## 2021-03-16 LAB — HEPATIC FUNCTION PANEL
ALT: 23 IU/L (ref 0–44)
AST: 22 IU/L (ref 0–40)
Albumin: 4.6 g/dL (ref 4.0–5.0)
Alkaline Phosphatase: 71 IU/L (ref 44–121)
Bilirubin Total: 0.5 mg/dL (ref 0.0–1.2)
Bilirubin, Direct: 0.13 mg/dL (ref 0.00–0.40)
Total Protein: 7.3 g/dL (ref 6.0–8.5)

## 2021-03-16 LAB — HEMOGLOBIN: Hemoglobin: 16.7 g/dL (ref 13.0–17.7)

## 2021-03-16 LAB — TESTOSTERONE: Testosterone: 417 ng/dL (ref 264–916)

## 2021-03-16 LAB — HEMATOCRIT: Hematocrit: 50.7 % (ref 37.5–51.0)

## 2021-03-22 ENCOUNTER — Telehealth: Payer: Self-pay | Admitting: *Deleted

## 2021-03-22 DIAGNOSIS — N529 Male erectile dysfunction, unspecified: Secondary | ICD-10-CM

## 2021-03-22 DIAGNOSIS — R7989 Other specified abnormal findings of blood chemistry: Secondary | ICD-10-CM

## 2021-03-22 NOTE — Telephone Encounter (Signed)
-----   Message from Harle Battiest, PA-C sent at 03/16/2021  7:59 AM EDT ----- Please let Mr. Schrade know that his blood work looks good.  I would like to see him in 3 months for PSA, testosterone (before 10am), Hematocrit, hepatic function panel and hemoglobin prior and I PSS, SHIM and exam.

## 2021-03-29 ENCOUNTER — Encounter: Payer: Self-pay | Admitting: Oncology

## 2021-06-01 ENCOUNTER — Ambulatory Visit: Payer: Self-pay | Admitting: Physician Assistant

## 2021-06-13 ENCOUNTER — Other Ambulatory Visit: Payer: Self-pay

## 2021-06-13 ENCOUNTER — Other Ambulatory Visit: Payer: BLUE CROSS/BLUE SHIELD

## 2021-06-13 DIAGNOSIS — R7989 Other specified abnormal findings of blood chemistry: Secondary | ICD-10-CM | POA: Diagnosis not present

## 2021-06-13 DIAGNOSIS — N529 Male erectile dysfunction, unspecified: Secondary | ICD-10-CM

## 2021-06-14 LAB — HEPATIC FUNCTION PANEL
ALT: 19 IU/L (ref 0–44)
AST: 20 IU/L (ref 0–40)
Albumin: 4.7 g/dL (ref 4.0–5.0)
Alkaline Phosphatase: 70 IU/L (ref 44–121)
Bilirubin Total: 0.3 mg/dL (ref 0.0–1.2)
Bilirubin, Direct: 0.11 mg/dL (ref 0.00–0.40)
Total Protein: 7.7 g/dL (ref 6.0–8.5)

## 2021-06-14 LAB — PSA: Prostate Specific Ag, Serum: 0.5 ng/mL (ref 0.0–4.0)

## 2021-06-14 LAB — HEMOGLOBIN AND HEMATOCRIT, BLOOD
Hematocrit: 48.6 % (ref 37.5–51.0)
Hemoglobin: 16 g/dL (ref 13.0–17.7)

## 2021-06-14 LAB — TESTOSTERONE: Testosterone: 238 ng/dL — ABNORMAL LOW (ref 264–916)

## 2021-06-21 NOTE — Progress Notes (Signed)
06/22/2021 3:48 PM   Ronnie Mejia 1971/03/01 [id number]  Referring provider: No referring provider defined for this encounter.  Urological history: 1. ED -contributing factors of age, DM, HTN, HLD and sleep apnea -SHIM 7 -tadalafil 20 mg daily  2. Testosterone deficiency -contributing factors of age, DM and obesity -testosterone 238 in 05/2021 -LFT's and H&H WNL in 05/2021 -Clomid 50 mg, 1/2 tablet daily  Chief Complaint  Patient presents with   Erectile Dysfunction     HPI: Ronnie Mejia is a 50 y.o. male who presents today for follow up.  Patient is having rare spontaneous erections.  He denies any pain or curvature with erections.   He is still not having firm enough erections for intercourse.     SHIM     Row Name 06/22/21 1511         SHIM: Over the last 6 months:   How do you rate your confidence that you could get and keep an erection? Very Low     When you had erections with sexual stimulation, how often were your erections hard enough for penetration (entering your partner)? Almost Never or Never     During sexual intercourse, how often were you able to maintain your erection after you had penetrated (entered) your partner? A Few Times (much less than half the time)     During sexual intercourse, how difficult was it to maintain your erection to completion of intercourse? Extremely Difficult     When you attempted sexual intercourse, how often was it satisfactory for you? A Few Times (much less than half the time)           SHIM Total Score   SHIM 7               Score: 1-7 Severe ED 8-11 Moderate ED 12-16 Mild-Moderate ED 17-21 Mild ED 22-25 No ED  He has no urinary complaints.  Patient denies any modifying or aggravating factors.  Patient denies any gross hematuria, dysuria or suprapubic/flank pain.  Patient denies any fevers, chills, nausea or vomiting.     IPSS     Row Name 06/22/21 1500         International Prostate Symptom  Score   How often have you had the sensation of not emptying your bladder? Not at All     How often have you had to urinate less than every two hours? Not at All     How often have you found you stopped and started again several times when you urinated? Not at All     How often have you found it difficult to postpone urination? Not at All     How often have you had a weak urinary stream? Not at All     How often have you had to strain to start urination? Not at All     How many times did you typically get up at night to urinate? 1 Time     Total IPSS Score 1           Quality of Life due to urinary symptoms   If you were to spend the rest of your life with your urinary condition just the way it is now how would you feel about that? Delighted              Score:  1-7 Mild 8-19 Moderate 20-35 Severe   PMH: Past Medical History:  Diagnosis Date   Anemia  Arthritis    Complication of anesthesia    PT WAS SHEDUL[facility] TO HAVE [facility] ON 10-06-15 AND AFTER INDUCTION PT ASPIRATED SCANT AMOUNT OF GASTRIC CONTENTS AND CASE WAS CANCELLED   Deviated septum    Diabetes mellitus (HCC)    on metformin   GERD (gastroesophageal reflux disease)    Hoarseness of voice    SINCE BEING INTUBATED ON 10-06-15   Hypertension    PT DENIES DURING 10-14-15 PHONE INTERVIEW    Nasal congestion    LONG TERM OBSTRUCTION/ENLARGED TURBINATES   Pancreatitis 05/2014   Pancreatitis    PONV (postoperative nausea and vomiting)    Sleep apnea     had surgery no longer uses cpap    Surgical History: Past Surgical History:  Procedure Laterality Date   ESOPHAGOGASTRODUODENOSCOPY (EGD) WITH PROPOFOL N/A 02/28/2018   Procedure: ESOPHAGOGASTRODUODENOSCOPY (EGD) WITH PROPOFOL;  Surgeon: Jeani HawkingHung, Patrick, MD;  Location: [facility];  Service: Endoscopy;  Laterality: N/A;   ESOPHAGOGASTRODUODENOSCOPY (EGD) WITH PROPOFOL N/A 04/18/2018   Procedure: ESOPHAGOGASTRODUODENOSCOPY (EGD) WITH PROPOFOL;   Surgeon: Jeani HawkingHung, Patrick, MD;  Location: [facility];  Service: Endoscopy;  Laterality: N/A;   HOT HEMOSTASIS N/A 02/28/2018   Procedure: HOT HEMOSTASIS (ARGON PLASMA COAGULATION/BICAP);  Surgeon: Jeani HawkingHung, Patrick, MD;  [facility];  Service: Endoscopy;  Laterality: N/A;   HOT HEMOSTASIS N/A 04/18/2018   Procedure: HOT HEMOSTASIS (ARGON PLASMA COAGULATION/BICAP);  Surgeon: Jeani HawkingHung, Patrick, MD;  [facility];  Service: Endoscopy;  Laterality: N/A;   NASAL SEPTOPLASTY W/ TURBINOPLASTY Bilateral 10/17/2015   Procedure: Nasal Septoplasty, Bilateral Partial Reduction Inferior Turbinates ;  Surgeon: Vernie MurdersPaul Juengel, MD;  Location: [facility];  Service: ENT;  Laterality: Bilateral;   SHOULDER SURGERY Right    ARTHROSCOPIC   WISDOM TOOTH EXTRACTION      Home Medications:  Allergies as of 06/22/2021   No Known Allergies      Medication List        Accurate as of June 22, 2021  3:48 PM. If you have any questions, ask your nurse or doctor.          Accu-Chek FastClix Lancets Misc Use as directed twice daily diag E11.65   Accu-Chek Guide test strip Generic drug: glucose blood 1 each by Other route 2 (two) times daily. Use as instructed twice a daily e11.65   clomiPHENE 50 MG tablet Commonly known as: CLOMID Take 1/2 tablet daily. What changed: Another medication with the same name was added. Make sure you understand how and when to take each. Changed by: Michiel CowboySHANNON Wylder Macomber, PA-C   clomiPHENE 50 MG tablet Commonly known as: CLOMID Take 1/2 tablet daily What changed: You were already taking a medication with the same name, and this prescription was added. Make sure you understand how and when to take each. Changed by: Michiel CowboySHANNON Cinde Ebert, PA-C   dapagliflozin propanediol 10 MG Tabs tablet Commonly known as: Farxiga Take 1 tablet (10 mg total) by mouth daily.   glimepiride 2 MG tablet Commonly known as: AMARYL Take 1 tablet (2 mg total) by mouth 2 (two) times daily.   Janumet  XR [phone] MG Tb24 Generic drug: SitaGLIPtin-MetFORMIN HCl Take 1 tablet by mouth daily at 12 noon.   pantoprazole 40 MG tablet Commonly known as: PROTONIX Take 1 tablet (40 mg total) by mouth daily.   tadalafil 20 MG tablet Commonly known as: CIALIS Take 1 tablet (20 mg total) by mouth daily.        Allergies: No Known Allergies  Family  History: Family History  Problem Relation Age of Onset   Diabetes Mother    Hypertension Mother    Diabetes Father    Cancer Other        paternal aunt    Social History:  reports that he has never smoked. He has never used smokeless tobacco. He reports current alcohol use. He reports that he does not use drugs.  ROS: Pertinent ROS in HPI  Physical Exam: BP (!) 143/82   Pulse 85   Ht 5\' 7"  (1.702 m)   Wt 199 lb (90.3 kg)   BMI 31.17 kg/m   Constitutional:  Well nourished. Alert and oriented, No acute distress. HEENT: [place] AT, mask in place.  Trachea midline Cardiovascular: No clubbing, cyanosis, or edema. Respiratory: Normal respiratory effort, no increased work of breathing. GU: No CVA tenderness.  No bladder fullness or masses.  Patient with circumcised phallus. Urethral meatus is patent.  No penile discharge. No penile lesions or rashes. Scrotum without lesions, cysts, rashes and/or edema.  Testicles are located scrotally bilaterally. No masses are appreciated in the testicles. Left and right epididymis are normal. Rectal: Patient with  normal sphincter tone. Anus and perineum without scarring or rashes. No rectal masses are appreciated. Prostate is approximately 40 grams, no nodules are appreciated. Seminal vesicles could not be palpated Neurologic: Grossly intact, no focal deficits, moving all 4 extremities. Psychiatric: Normal mood and affect.   Laboratory Data: Component     Latest Ref Rng & Units 02/13/2018 06/27/2018 09/16/2018 12/17/2018  Hemoglobin     13.0 - 17.7 g/dL 6.7 (LL) 8.1 (L) 12/19/2018 38.7  HCT     37.5 - 51.0 %  26.5 (L) 28.7 (L) 43.0 43.7   Component     Latest Ref Rng & Units 05/12/2019 10/26/2019 11/12/2019 03/15/2020  Hemoglobin     13.0 - 17.7 g/dL 03/17/2020 33.2 95.1 88.4  HCT     37.5 - 51.0 % 46.9 50.8 48.1 46.8   Component     Latest Ref Rng & Units 08/01/2020 09/20/2020 03/15/2021 06/13/2021  Hemoglobin     13.0 - 17.7 g/dL 06/15/2021 06.3 01.6 01.0  HCT     37.5 - 51.0 % 49.2 49.0 50.7 48.6   Component     Latest Ref Rng & Units 02/13/2018 10/26/2019 09/20/2020 11/03/2020  Prostate Specific Ag, Serum     0.0 - 4.0 ng/mL 0.5 0.5 0.5 0.5   Component     Latest Ref Rng & Units 06/13/2021  Prostate Specific Ag, Serum     0.0 - 4.0 ng/mL 0.5   Component     Latest Ref Rng & Units 09/20/2020 11/03/2020 12/07/2020 03/15/2021  Testosterone     264 - 916 ng/dL 03/17/2021 355 (L) 732 202   Component     Latest Ref Rng & Units 06/13/2021  Testosterone     264 - 916 ng/dL 06/15/2021 (L)   Component     Latest Ref Rng & Units 02/13/2018 06/27/2018 10/26/2019 09/20/2020  Total Protein     6.0 - 8.5 g/dL 7.8 8.6 (H) 7.9 7.6  Albumin     4.0 - 5.0 g/dL 4.3 4.2 4.6 4.5  Total Bilirubin     0.0 - 1.2 mg/dL 0.4 0.8 0.5 0.6  BILIRUBIN, DIRECT     0.00 - 0.40 mg/dL      Alkaline Phosphatase     44 - 121 IU/L 79 73 74 75  AST     0 - 40 IU/L 27 32  30 21  ALT     0 - 44 IU/L 25 29 34 26   Component     Latest Ref Rng & Units 03/15/2021 06/13/2021  Total Protein     6.0 - 8.5 g/dL 7.3 7.7  Albumin     4.0 - 5.0 g/dL 4.6 4.7  Total Bilirubin     0.0 - 1.2 mg/dL 0.5 0.3  BILIRUBIN, DIRECT     0.00 - 0.40 mg/dL 2.09 4.70  Alkaline Phosphatase     44 - 121 IU/L 71 70  AST     0 - 40 IU/L 22 20  ALT     0 - 44 IU/L 23 19   Lab Results  Component Value Date   HGBA1C 7.8 (A) 03/01/2021  I have reviewed the labs.   Pertinent Imaging: N/A  Assessment & Plan:   1. Erectile dysfunction -Patient is having occasional spontaneous erection, but he states his erections have not been firm enough with the daily  tadalafil for intercourse -He has not been taking the Clomid as prescribed and therefore his testosterone is not at therapeutic levels -We will get him restarted on the Clomid and reassess his ED once his testosterone have reached therapeutic levels  2. Testosterone deficiency -We will restart Clomid 50 mg half tablet at this time  3. BPH with LU TS -PSA normal -DRE benign -no bothersome symptoms   Return in about 3 months (around 09/22/2021) for testosterone in the am .  These notes generated with voice recognition software. I apologize for typographical errors.  Michiel Cowboy, PA-C  [facility] [address]  [address] [place], [place] [address] [phone]

## 2021-06-22 ENCOUNTER — Other Ambulatory Visit: Payer: Self-pay

## 2021-06-22 ENCOUNTER — Ambulatory Visit (INDEPENDENT_AMBULATORY_CARE_PROVIDER_SITE_OTHER): Payer: BLUE CROSS/BLUE SHIELD | Admitting: Urology

## 2021-06-22 ENCOUNTER — Encounter: Payer: Self-pay | Admitting: Urology

## 2021-06-22 VITALS — BP 143/82 | HR 85 | Ht 67.0 in | Wt 199.0 lb

## 2021-06-22 DIAGNOSIS — N138 Other obstructive and reflux uropathy: Secondary | ICD-10-CM

## 2021-06-22 DIAGNOSIS — N529 Male erectile dysfunction, unspecified: Secondary | ICD-10-CM | POA: Diagnosis not present

## 2021-06-22 DIAGNOSIS — N401 Enlarged prostate with lower urinary tract symptoms: Secondary | ICD-10-CM

## 2021-06-22 DIAGNOSIS — E349 Endocrine disorder, unspecified: Secondary | ICD-10-CM | POA: Diagnosis not present

## 2021-06-22 MED ORDER — CLOMIPHENE CITRATE 50 MG PO TABS: ORAL_TABLET | ORAL | 3 refills | Status: DC

## 2021-07-26 ENCOUNTER — Encounter: Payer: Self-pay | Admitting: Oncology

## 2021-08-04 ENCOUNTER — Other Ambulatory Visit: Payer: Self-pay | Admitting: Adult Health

## 2021-08-04 DIAGNOSIS — K219 Gastro-esophageal reflux disease without esophagitis: Secondary | ICD-10-CM

## 2021-08-13 ENCOUNTER — Encounter: Payer: Self-pay | Admitting: Oncology

## 2021-08-17 ENCOUNTER — Encounter: Payer: Self-pay | Admitting: Physician Assistant

## 2021-09-11 ENCOUNTER — Encounter: Payer: Self-pay | Admitting: Oncology

## 2021-09-26 ENCOUNTER — Other Ambulatory Visit: Payer: BLUE CROSS/BLUE SHIELD

## 2021-09-26 ENCOUNTER — Encounter: Payer: Self-pay | Admitting: Urology

## 2021-11-06 ENCOUNTER — Other Ambulatory Visit: Payer: Self-pay

## 2021-11-06 ENCOUNTER — Telehealth: Payer: Self-pay

## 2021-11-06 ENCOUNTER — Encounter: Payer: Self-pay | Admitting: Physician Assistant

## 2021-11-06 ENCOUNTER — Encounter: Payer: Self-pay | Admitting: Oncology

## 2021-11-06 ENCOUNTER — Ambulatory Visit (INDEPENDENT_AMBULATORY_CARE_PROVIDER_SITE_OTHER): Payer: BC Managed Care – PPO | Admitting: Physician Assistant

## 2021-11-06 DIAGNOSIS — K219 Gastro-esophageal reflux disease without esophagitis: Secondary | ICD-10-CM

## 2021-11-06 DIAGNOSIS — Z0001 Encounter for general adult medical examination with abnormal findings: Secondary | ICD-10-CM

## 2021-11-06 DIAGNOSIS — I10 Essential (primary) hypertension: Secondary | ICD-10-CM

## 2021-11-06 DIAGNOSIS — G4733 Obstructive sleep apnea (adult) (pediatric): Secondary | ICD-10-CM | POA: Diagnosis not present

## 2021-11-06 DIAGNOSIS — E1165 Type 2 diabetes mellitus with hyperglycemia: Secondary | ICD-10-CM

## 2021-11-06 DIAGNOSIS — E782 Mixed hyperlipidemia: Secondary | ICD-10-CM

## 2021-11-06 DIAGNOSIS — Z0100 Encounter for examination of eyes and vision without abnormal findings: Secondary | ICD-10-CM | POA: Diagnosis not present

## 2021-11-06 DIAGNOSIS — R5383 Other fatigue: Secondary | ICD-10-CM

## 2021-11-06 DIAGNOSIS — R300 Dysuria: Secondary | ICD-10-CM

## 2021-11-06 DIAGNOSIS — E119 Type 2 diabetes mellitus without complications: Secondary | ICD-10-CM

## 2021-11-06 LAB — POCT GLYCOSYLATED HEMOGLOBIN (HGB A1C): Hemoglobin A1C: 7.5 % — AB (ref 4.0–5.6)

## 2021-11-06 MED ORDER — DAPAGLIFLOZIN PROPANEDIOL 10 MG PO TABS: 10.0000 mg | ORAL_TABLET | Freq: Every day | ORAL | 1 refills | Status: DC

## 2021-11-06 MED ORDER — GLIMEPIRIDE 2 MG PO TABS: 2.0000 mg | ORAL_TABLET | Freq: Two times a day (BID) | ORAL | 1 refills | Status: DC

## 2021-11-06 MED ORDER — JANUMET XR 100-1000 MG PO TB24: 1.0000 | ORAL_TABLET | Freq: Every day | ORAL | 2 refills | Status: DC

## 2021-11-06 MED ORDER — PANTOPRAZOLE SODIUM 40 MG PO TBEC: 40.0000 mg | DELAYED_RELEASE_TABLET | Freq: Every day | ORAL | 1 refills | Status: DC

## 2021-11-06 NOTE — Progress Notes (Signed)
[facility] [address] [place], [place] [address]  Internal MEDICINE  Office Visit Note  Patient Name: Ronnie Mejia  [id number]  [id number]  Date of Service: 11/07/2021  Chief Complaint  Patient presents with   Annual Exam   Diabetes   Hypertension   Gastroesophageal Reflux   Quality Metric Gaps    Foot exam, eye exam, colonoscopy     HPI Pt is here for routine health maintenance examination -BG not checked recently and has not been seen in office for repeat A1c in awhile. 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 States things are starting to calm down some. -States he has been taking his meds for the most part but has missed some days here and there and is in need of refills -Sleeping ok, 6 hours nightly, has hx of OSA but was unable to tolerate cpap and underwent surgery with ENT. He never did retest after this and states he does snore still. He is interested in repeat HST to evaluate -did have endoscopy and colonoscopy a few years ago due to blood loss. Thinks it was done in [place]. Upon chart review can see upper endoscopy encounters but no colonoscopy to review.  -BP borderline in office and will need to monitor -Will be referred for eye exam  -Foot exam done in office today -Due for routine fasting labs  Current Medication: Outpatient Encounter Medications as of 11/06/2021  Medication Sig   Accu-Chek FastClix Lancets MISC Use as directed twice daily diag E11.65   clomiPHENE (CLOMID) 50 MG tablet Take 1/2 tablet daily.   clomiPHENE (CLOMID) 50 MG tablet Take 1/2 tablet daily   glucose blood (ACCU-CHEK GUIDE) test strip 1 each by Other route 2 (two) times daily. Use as instructed twice a daily e11.65   tadalafil (CIALIS) 20 MG tablet Take 1 tablet (20 mg total) by mouth daily.   [DISCONTINUED] dapagliflozin propanediol (FARXIGA) 10 MG TABS tablet Take 1 tablet (10 mg total) by mouth daily.   [DISCONTINUED] glimepiride (AMARYL) 2  MG tablet Take 1 tablet (2 mg total) by mouth 2 (two) times daily.   [DISCONTINUED] pantoprazole (PROTONIX) 40 MG tablet Take 1 tablet (40 mg total) by mouth daily.   [DISCONTINUED] SitaGLIPtin-MetFORMIN HCl (JANUMET XR) [phone] MG TB24 Take 1 tablet by mouth daily at 12 noon.   dapagliflozin propanediol (FARXIGA) 10 MG TABS tablet Take 1 tablet (10 mg total) by mouth daily.   glimepiride (AMARYL) 2 MG tablet Take 1 tablet (2 mg total) by mouth 2 (two) times daily.   pantoprazole (PROTONIX) 40 MG tablet Take 1 tablet (40 mg total) by mouth daily.   SitaGLIPtin-MetFORMIN HCl (JANUMET XR) [phone] MG TB24 Take 1 tablet by mouth daily at 12 noon.   No facility-administered encounter medications on file as of 11/06/2021.    Surgical History: Past Surgical History:  Procedure Laterality Date   ESOPHAGOGASTRODUODENOSCOPY (EGD) WITH PROPOFOL N/A 02/28/2018   Procedure: ESOPHAGOGASTRODUODENOSCOPY (EGD) WITH PROPOFOL;  Surgeon: Jeani Hawking, MD;  Location: [facility];  Service: Endoscopy;  Laterality: N/A;   ESOPHAGOGASTRODUODENOSCOPY (EGD) WITH PROPOFOL N/A 04/18/2018   Procedure: ESOPHAGOGASTRODUODENOSCOPY (EGD) WITH PROPOFOL;  Surgeon: Jeani Hawking, MD;  Location: [facility];  Service: Endoscopy;  Laterality: N/A;   HOT HEMOSTASIS N/A 02/28/2018   Procedure: HOT HEMOSTASIS (ARGON PLASMA COAGULATION/BICAP);  Surgeon: Jeani Hawking, MD;  Location: [facility];  Service: Endoscopy;  Laterality: N/A;   HOT HEMOSTASIS N/A 04/18/2018   Procedure: HOT HEMOSTASIS (ARGON PLASMA COAGULATION/BICAP);  Surgeon: Jeani Hawking, MD;  Location: [facility];  Service: Endoscopy;  Laterality: N/A;   NASAL SEPTOPLASTY W/ TURBINOPLASTY Bilateral 10/17/2015   Procedure: Nasal Septoplasty, Bilateral Partial Reduction Inferior Turbinates ;  Surgeon: Vernie Murders, MD;  Location: [facility];  Service: ENT;  Laterality: Bilateral;   SHOULDER SURGERY Right    ARTHROSCOPIC   WISDOM TOOTH EXTRACTION      Medical  History: Past Medical History:  Diagnosis Date   Anemia    Arthritis    Complication of anesthesia    PT WAS SHEDULED TO HAVE IGSS AT [facility] ON 10-06-15 AND AFTER INDUCTION PT ASPIRATED SCANT AMOUNT OF GASTRIC CONTENTS AND CASE WAS CANCELLED   Deviated septum    Diabetes mellitus (HCC)    on metformin   GERD (gastroesophageal reflux disease)    Hoarseness of voice    SINCE BEING INTUBATED ON 10-06-15   Hypertension    PT DENIES DURING 10-14-15 PHONE INTERVIEW    Nasal congestion    LONG TERM OBSTRUCTION/ENLARGED TURBINATES   Pancreatitis 05/2014   Pancreatitis    PONV (postoperative nausea and vomiting)    Sleep apnea     had surgery no longer uses cpap    Family History: Family History  Problem Relation Age of Onset   Diabetes Mother    Hypertension Mother    Diabetes Father    Cancer Other        paternal aunt      Review of Systems  Constitutional:  Negative for chills, fatigue and unexpected weight change.  HENT:  Negative for congestion, postnasal drip, rhinorrhea, sneezing and sore throat.   Eyes:  Negative for redness.  Respiratory:  Negative for cough, chest tightness and shortness of breath.   Cardiovascular:  Negative for chest pain and palpitations.  Gastrointestinal:  Negative for abdominal pain, constipation, diarrhea, nausea and vomiting.  Genitourinary:  Negative for dysuria and frequency.  Musculoskeletal:  Negative for arthralgias, back pain, joint swelling and neck pain.  Skin:  Negative for rash.  Neurological:  Negative for tremors and numbness.  Hematological:  Negative for adenopathy. Does not bruise/bleed easily.  Psychiatric/Behavioral:  Negative for behavioral problems (Depression), sleep disturbance and suicidal ideas. The patient is not nervous/anxious.     Vital Signs: BP (!) 144/85    Pulse 100    Temp 98 F (36.7 C)    Resp 16    Ht 5\' 7"  (1.702 m)    Wt 196 lb 3.2 oz (89 kg)    SpO2 97%    BMI 30.73 kg/m    Physical  Exam Vitals and nursing note reviewed.  Constitutional:      Appearance: Normal appearance. He is obese.  HENT:     Head: Normocephalic and atraumatic.  Eyes:     Extraocular Movements: Extraocular movements intact.     Pupils: Pupils are equal, round, and reactive to light.  Cardiovascular:     Rate and Rhythm: Normal rate and regular rhythm.     Pulses: Normal pulses.     Heart sounds: Normal heart sounds.  Pulmonary:     Effort: Pulmonary effort is normal.     Breath sounds: Normal breath sounds.  Abdominal:     General: Abdomen is flat. Bowel sounds are normal.     Palpations: Abdomen is soft.     Tenderness: There is no abdominal tenderness.  Musculoskeletal:        General: Normal range of motion.     Cervical back:  Normal range of motion.  Skin:    General: Skin is warm.  Neurological:     General: No focal deficit present.     Mental Status: He is alert and oriented to person, place, and time. Mental status is at baseline.  Psychiatric:        Mood and Affect: Mood normal.        Behavior: Behavior normal.        Thought Content: Thought content normal.        Judgment: Judgment normal.     LABS: Recent Results (from the past 2160 hour(s))  POCT HgB A1C     Status: Abnormal   Collection Time: 11/06/21  2:01 PM  Result Value Ref Range   Hemoglobin A1C 7.5 (A) 4.0 - 5.6 %   HbA1c POC (<> result, manual entry)     HbA1c, POC (prediabetic range)     HbA1c, POC (controlled diabetic range)    UA/M w/rflx Culture, Routine     Status: Abnormal   Collection Time: 11/06/21  2:54 PM   Specimen: Urine   Urine  Result Value Ref Range   Specific Gravity, UA      >=1.030 (A) 1.005 - 1.030   pH, UA 5.0 5.0 - 7.5   Color, UA Yellow Yellow   Appearance Ur Clear Clear   Leukocytes,UA Negative Negative   Protein,UA Trace Negative/Trace   Glucose, UA 3+ (A) Negative   Ketones, UA Negative Negative   RBC, UA Negative Negative   Bilirubin, UA Negative Negative    Urobilinogen, Ur 0.2 0.2 - 1.0 mg/dL   Nitrite, UA Negative Negative   Microscopic Examination Comment     Comment: Microscopic follows if indicated.   Microscopic Examination See below:     Comment: Microscopic was indicated and was performed.   Urinalysis Reflex Comment     Comment: This specimen will not reflex to a Urine Culture.  Microscopic Examination     Status: None   Collection Time: 11/06/21  2:54 PM   Urine  Result Value Ref Range   WBC, UA None seen 0 - 5 /hpf   RBC None seen 0 - 2 /hpf   Epithelial Cells (non renal) None seen 0 - 10 /hpf   Casts None seen None seen /lpf   Bacteria, UA None seen None seen/Few        Assessment/Plan: 1. Encounter for general adult medical examination with abnormal findings CPE performed, routine fasting labs ordered, patient believes he is up-to-date on colonoscopy done in 2019 however unable to confirm via records.  May need to look into this further to obtain copy  2. Type 2 diabetes mellitus with hyperglycemia, without long-term current use of insulin (HCC) - POCT HgB A1C is 7.5 which is improved from 7.8 at last check but is still uncontrolled especially compared to prior readings.  Patient will do better about monitoring blood sugars, improving diet and exercise, and adhering to medications.  Refill sent today - glimepiride (AMARYL) 2 MG tablet; Take 1 tablet (2 mg total) by mouth 2 (two) times daily.  Dispense: 180 tablet; Refill: 1 - dapagliflozin propanediol (FARXIGA) 10 MG TABS tablet; Take 1 tablet (10 mg total) by mouth daily.  Dispense: 90 tablet; Refill: 1 - SitaGLIPtin-MetFORMIN HCl (JANUMET XR) [phone] MG TB24; Take 1 tablet by mouth daily at 12 noon.  Dispense: 90 tablet; Refill: 2  3. Essential hypertension Not currently on any medication however has borderline pressure in office today.  Patient  will monitor BP  4. OSA (obstructive sleep apnea) History of OSA status post ENT surgery and is in need of repeat sleep  study to evaluate if surgery was successful - Home sleep test  5. Diabetic eye exam [facility](HCC) - Ambulatory referral to Ophthalmology  6. Gastroesophageal reflux disease without esophagitis - pantoprazole (PROTONIX) 40 MG tablet; Take 1 tablet (40 mg total) by mouth daily.  Dispense: 90 tablet; Refill: 1  7. Mixed hyperlipidemia - Lipid Panel With LDL/HDL Ratio  8. Other fatigue - TSH + free T4 - CBC w/Diff/Platelet - Comprehensive metabolic panel  9. Dysuria - UA/M w/rflx Culture, Routine   General Counseling: Ronnie Mejia verbalizes understanding of the findings of todays visit and agrees with plan of treatment. I have discussed any further diagnostic evaluation that may be needed or ordered today. We also reviewed his medications today. he has been encouraged to call the office with any questions or concerns that should arise related to todays visit.    Counseling:    Orders Placed This Encounter  Procedures   Microscopic Examination   UA/M w/rflx Culture, Routine   Lipid Panel With LDL/HDL Ratio   TSH + free T4   CBC w/Diff/Platelet   Comprehensive metabolic panel   Ambulatory referral to Ophthalmology   POCT HgB A1C   Home sleep test    Meds ordered this encounter  Medications   pantoprazole (PROTONIX) 40 MG tablet    Sig: Take 1 tablet (40 mg total) by mouth daily.    Dispense:  90 tablet    Refill:  1   glimepiride (AMARYL) 2 MG tablet    Sig: Take 1 tablet (2 mg total) by mouth 2 (two) times daily.    Dispense:  180 tablet    Refill:  1   dapagliflozin propanediol (FARXIGA) 10 MG TABS tablet    Sig: Take 1 tablet (10 mg total) by mouth daily.    Dispense:  90 tablet    Refill:  1   SitaGLIPtin-MetFORMIN HCl (JANUMET XR) [phone] MG TB24    Sig: Take 1 tablet by mouth daily at 12 noon.    Dispense:  90 tablet    Refill:  2    This patient was seen by Lynn ItoLauren Journe Hallmark, PA-C in collaboration with Dr. Beverely RisenFozia Khan as a part of collaborative care  agreement.  Total time spent:35 Minutes  Time spent includes review of chart, medications, test results, and follow up plan with the patient.     Lyndon CodeFozia M Khan, MD  Internal Medicine

## 2021-11-06 NOTE — Telephone Encounter (Signed)
Awaiting 11/06/21 office notes for ophthalmology referral-Toni

## 2021-11-06 NOTE — Telephone Encounter (Signed)
Home sleep study ordered. Printed. Gave to Tat-Toni

## 2021-11-07 LAB — UA/M W/RFLX CULTURE, ROUTINE
Bilirubin, UA: NEGATIVE
Ketones, UA: NEGATIVE
Leukocytes,UA: NEGATIVE
Nitrite, UA: NEGATIVE
RBC, UA: NEGATIVE
Specific Gravity, UA: >=1.03 — AB (ref 1.005–1.030)
Urobilinogen, Ur: 0.2 mg/dL (ref 0.2–1.0)
pH, UA: 5 (ref 5.0–7.5)

## 2021-11-07 LAB — MICROSCOPIC EXAMINATION
Bacteria, UA: NONE SEEN
Casts: NONE SEEN /lpf
Epithelial Cells (non renal): NONE SEEN /hpf (ref 0–10)
RBC, Urine: NONE SEEN /hpf (ref 0–2)
WBC, UA: NONE SEEN /hpf (ref 0–5)

## 2021-11-09 NOTE — Telephone Encounter (Signed)
Ophthalmology referral sent via [facility] to [facility]-Toni 

## 2021-11-13 ENCOUNTER — Other Ambulatory Visit: Payer: Self-pay | Admitting: Urology

## 2021-11-13 NOTE — Telephone Encounter (Signed)
Eye appointment 11/27/21 @ 8:30-Toni

## 2021-11-16 ENCOUNTER — Telehealth: Payer: Self-pay

## 2021-11-16 DIAGNOSIS — E1165 Type 2 diabetes mellitus with hyperglycemia: Secondary | ICD-10-CM

## 2021-11-16 MED ORDER — DAPAGLIFLOZIN PROPANEDIOL 10 MG PO TABS: 10.0000 mg | ORAL_TABLET | Freq: Every day | ORAL | 1 refills | Status: DC

## 2021-11-16 MED ORDER — JANUMET XR 100-1000 MG PO TB24: 1.0000 | ORAL_TABLET | Freq: Every day | ORAL | 2 refills | Status: DC

## 2021-11-16 NOTE — Telephone Encounter (Signed)
PA for FARXIGA 10 mg and JANUMET XR [phone] mg was sent on 11/16/21 and both came back approved.  FARXIGA case #: [id number]  JANUMET XR case #: [id number] Both valid from 10/17/21 to 11/16/2021  Pt notified and sent new rx to pharmacy

## 2021-11-17 ENCOUNTER — Telehealth: Payer: Self-pay

## 2021-11-17 NOTE — Telephone Encounter (Signed)
Patient is declining to schedule sleep study due to cost. tat °

## 2021-11-29 ENCOUNTER — Other Ambulatory Visit: Payer: Self-pay | Admitting: *Deleted

## 2021-11-29 DIAGNOSIS — E349 Endocrine disorder, unspecified: Secondary | ICD-10-CM

## 2021-11-30 ENCOUNTER — Other Ambulatory Visit: Payer: Self-pay

## 2021-11-30 DIAGNOSIS — E349 Endocrine disorder, unspecified: Secondary | ICD-10-CM

## 2021-11-30 DIAGNOSIS — E782 Mixed hyperlipidemia: Secondary | ICD-10-CM | POA: Diagnosis not present

## 2021-11-30 DIAGNOSIS — R5383 Other fatigue: Secondary | ICD-10-CM | POA: Diagnosis not present

## 2021-12-01 ENCOUNTER — Encounter: Payer: Self-pay | Admitting: Oncology

## 2021-12-01 LAB — TSH+FREE T4
Free T4: 1.24 ng/dL (ref 0.82–1.77)
TSH: 1.41 u[IU]/mL (ref 0.450–4.500)

## 2021-12-01 LAB — COMPREHENSIVE METABOLIC PANEL
ALT: 25 IU/L (ref 0–44)
AST: 18 IU/L (ref 0–40)
Albumin/Globulin Ratio: 1.4 (ref 1.2–2.2)
Albumin: 4.7 g/dL (ref 4.0–5.0)
Alkaline Phosphatase: 81 IU/L (ref 44–121)
BUN/Creatinine Ratio: 18 (ref 9–20)
BUN: 15 mg/dL (ref 6–24)
Bilirubin Total: 0.6 mg/dL (ref 0.0–1.2)
CO2: 23 mmol/L (ref 20–29)
Calcium: 9.6 mg/dL (ref 8.7–10.2)
Chloride: 104 mmol/L (ref 96–106)
Creatinine, Ser: 0.84 mg/dL (ref 0.76–1.27)
Globulin, Total: 3.4 g/dL (ref 1.5–4.5)
Glucose: 161 mg/dL — ABNORMAL HIGH (ref 70–99)
Potassium: 4.7 mmol/L (ref 3.5–5.2)
Sodium: 142 mmol/L (ref 134–144)
Total Protein: 8.1 g/dL (ref 6.0–8.5)
eGFR: 108 mL/min/{1.73_m2} (ref >59)

## 2021-12-01 LAB — CBC WITH DIFFERENTIAL/PLATELET
Basophils Absolute: 0 10*3/uL (ref 0.0–0.2)
Basos: 1 %
EOS (ABSOLUTE): 0 10*3/uL (ref 0.0–0.4)
Eos: 1 %
Hematocrit: 52.1 % — ABNORMAL HIGH (ref 37.5–51.0)
Hemoglobin: 17.4 g/dL (ref 13.0–17.7)
Immature Grans (Abs): 0.1 10*3/uL (ref 0.0–0.1)
Immature Granulocytes: 2 %
Lymphocytes Absolute: 1.7 10*3/uL (ref 0.7–3.1)
Lymphs: 46 %
MCH: 28.1 pg (ref 26.6–33.0)
MCHC: 33.4 g/dL (ref 31.5–35.7)
MCV: 84 fL (ref 79–97)
Monocytes Absolute: 0.4 10*3/uL (ref 0.1–0.9)
Monocytes: 10 %
Neutrophils Absolute: 1.4 10*3/uL (ref 1.4–7.0)
Neutrophils: 40 %
Platelets: 190 10*3/uL (ref 150–450)
RBC: 6.2 x10E6/uL — ABNORMAL HIGH (ref 4.14–5.80)
RDW: 13.1 % (ref 11.6–15.4)
WBC: 3.5 10*3/uL (ref 3.4–10.8)

## 2021-12-01 LAB — LIPID PANEL WITH LDL/HDL RATIO
Cholesterol, Total: 180 mg/dL (ref 100–199)
HDL: 34 mg/dL — ABNORMAL LOW (ref >39)
LDL Chol Calc (NIH): 128 mg/dL — ABNORMAL HIGH (ref 0–99)
LDL/HDL Ratio: 3.8 ratio — ABNORMAL HIGH (ref 0.0–3.6)
Triglycerides: 97 mg/dL (ref 0–149)
VLDL Cholesterol Cal: 18 mg/dL (ref 5–40)

## 2021-12-01 LAB — TESTOSTERONE: Testosterone: 227 ng/dL — ABNORMAL LOW (ref 264–916)

## 2021-12-14 ENCOUNTER — Telehealth: Payer: Self-pay

## 2021-12-14 MED ORDER — ROSUVASTATIN CALCIUM 5 MG PO TABS: 5.0000 mg | ORAL_TABLET | Freq: Every day | ORAL | 1 refills | Status: DC

## 2021-12-14 NOTE — Telephone Encounter (Signed)
-----   Message from Carlean Jews, PA-C sent at 12/13/2021  4:41 PM EST ----- Please let patient know that his cholesterol is still elevated and I would like him to start on a statin. If willing please send 5mg  crestor. Additionally his RBC is a little elevated which can be due to OSA. Otherwise his labs looked ok apart from elevated glucose

## 2021-12-14 NOTE — Telephone Encounter (Signed)
Pt informed of his lab results and that his cholesterol was elevated and that Lauren wants to start him on a statin medication if he agrees, which pt did agree to start medication Crestor 5 mg.  Pt advised that we will send medication to his pharmacy.

## 2022-02-05 ENCOUNTER — Ambulatory Visit: Payer: BC Managed Care – PPO | Admitting: Physician Assistant

## 2022-02-05 ENCOUNTER — Encounter: Payer: Self-pay | Admitting: Physician Assistant

## 2022-02-05 VITALS — BP 138/88 | HR 72 | Temp 98.3°F | Ht 67.0 in | Wt 194.8 lb

## 2022-02-05 DIAGNOSIS — E1165 Type 2 diabetes mellitus with hyperglycemia: Secondary | ICD-10-CM

## 2022-02-05 DIAGNOSIS — E782 Mixed hyperlipidemia: Secondary | ICD-10-CM | POA: Diagnosis not present

## 2022-02-05 DIAGNOSIS — K219 Gastro-esophageal reflux disease without esophagitis: Secondary | ICD-10-CM

## 2022-02-05 LAB — POCT GLYCOSYLATED HEMOGLOBIN (HGB A1C): Hemoglobin A1C: 7.4 % — AB (ref 4.0–5.6)

## 2022-02-05 NOTE — Progress Notes (Signed)
[facility] ?[address] ?[place], [place] [id number] ? ?Internal MEDICINE  ?Office Visit Note ? ?Patient Name: Ronnie DacostaRonnie G Sklar ? [id number]  ?[id number] ? ?Date of Service: 02/05/2022 ? ?Chief Complaint  ?Patient presents with  ? Follow-up  ? Hypertension  ? Diabetes  ? Medication Refill  ?  Marcelline DeistFarxiga   ? Quality Metric Gaps  ?  Colonoscopy and Foot Exam  ? ? 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 Advised to get BP monitor to follow at home too ? ?Current Medication: ?Outpatient Encounter Medications as of 02/05/2022  ?Medication Sig  ? Accu-Chek FastClix Lancets MISC Use as directed twice daily diag E11.65  ? clomiPHENE (CLOMID) 50 MG tablet Take 1/2 tablet daily.  ? clomiPHENE (CLOMID) 50 MG tablet Take 1/2 tablet daily  ? dapagliflozin propanediol (FARXIGA) 10 MG TABS tablet Take 1 tablet (10 mg total) by mouth daily.  ? glimepiride (AMARYL) 2 MG tablet Take 1 tablet (2 mg total) by mouth 2 (two) times daily.  ? glucose blood (ACCU-CHEK GUIDE) test strip 1 each by Other route 2 (two) times daily. Use as instructed twice a daily e11.65  ? pantoprazole (PROTONIX) 40 MG tablet Take 1 tablet (40 mg total) by mouth daily.  ? rosuvastatin (CRESTOR) 5 MG tablet Take 1 tablet (5 mg total) by mouth daily. Take 1 tablet by mouth daily for cholesterol  ? SitaGLIPtin-MetFORMIN HCl (JANUMET XR) [phone] MG TB24 Take 1 tablet by mouth daily at 12 noon.  ? tadalafil (CIALIS) 20 MG tablet Take 1 tablet (20 mg total) by mouth daily.  ? ?No facility-administered encounter medications on file as of 02/05/2022.   ? ? ?Surgical History: ?Past Surgical History:  ?Procedure Laterality Date  ? ESOPHAGOGASTRODUODENOSCOPY (EGD) WITH PROPOFOL N/A 02/28/2018  ? Procedure: ESOPHAGOGASTRODUODENOSCOPY (EGD) WITH PROPOFOL;  Surgeon: Jeani HawkingHung, Patrick, MD;  Location: [facility];  Service: Endoscopy;  Laterality: N/A;  ? ESOPHAGOGASTRODUODENOSCOPY (EGD) WITH PROPOFOL N/A 04/18/2018  ? Procedure: ESOPHAGOGASTRODUODENOSCOPY (EGD) WITH PROPOFOL;  Surgeon: Jeani HawkingHung, Patrick, MD;  Location: [facility];  Service: Endoscopy;  Laterality: N/A;  ? HOT HEMOSTASIS N/A 02/28/2018  ? Procedure: HOT HEMOSTASIS (ARGON PLASMA COAGULATION/BICAP);  Surgeon: Jeani HawkingHung, Patrick, MD;  [facility];  Service: Endoscopy;  Laterality: N/A;  ? HOT HEMOSTASIS N/A 04/18/2018  ? Procedure: HOT HEMOSTASIS (ARGON PLASMA COAGULATION/BICAP);  Surgeon: Jeani HawkingHung, Patrick, MD;  [facility];  Service: Endoscopy;  Laterality: N/A;  ? NASAL SEPTOPLASTY W/ TURBINOPLASTY Bilateral 10/17/2015  ? Procedure: Nasal Septoplasty, Bilateral Partial Reduction Inferior Turbinates ;  Surgeon: Vernie MurdersPaul Juengel, MD;  Location: [facility];  Service: ENT;  Laterality: Bilateral;  ? SHOULDER SURGERY Right   ? ARTHROSCOPIC  ? WISDOM TOOTH EXTRACTION    ? ? ?Medical History: ?Past Medical History:  ?Diagnosis Date  ? Anemia   ? Arthritis   ? Complication of anesthesia   ? PT WAS SHED[facility] TO [facility] ON [facility]06-15 AND AFTER INDUCTION PT ASPIRATED SCANT AMOUNT OF GASTRIC CONTENTS AND CASE WAS CANCELLED  ? Deviated septum   ? Diabetes mellitus (HCC)   ?  on metformin  ? GERD (gastroesophageal reflux disease)   ? Hoarseness of voice   ? SINCE BEING INTUBATED ON 10-06-15  ? Hypertension   ? PT DENIES DURING 10-14-15 PHONE INTERVIEW   ? Nasal congestion   ? LONG TERM OBSTRUCTION/ENLARGED TURBINATES  ? Pancreatitis 05/2014  ? Pancreatitis   ? PONV (postoperative nausea and vomiting)   ? Sleep apnea   ?  had surgery no longer uses cpap  ? ? ?Family History: ?Family History  ?Problem  Relation Age of Onset  ? Diabetes Mother   ? Hypertension Mother   ? Diabetes Father   ? Cancer Other   ?     paternal aunt  ? ? ?Social History  ? ?Socioeconomic History  ? Marital status: Married  ?  Spouse name: Not on file  ? Number of children: Not on file  ? Years of education: Not on file  ? Highest education level: Not on file  ?Occupational History  ? Occupation: Curator  ?  Employer: [employer]  ?Tobacco Use  ? Smoking status: Never  ? Smokeless tobacco: Never  ?Vaping Use  ? Vaping Use: Never used  ?Substance and Sexual Activity  ? Alcohol use: Yes  ?  Comment: occasional  ? Drug use: No  ? Sexual activity: Yes  ?Other Topics Concern  ? Not on file  ?Social History Narrative  ? Not on file  ? ?Social Determinants of Health  ? ?Financial Resource Strain: Not on file  ?Food Insecurity: Not on file  ?Transportation Needs: Not on file  ?Physical Activity: Not on file  ?Stress: Not on file  ?Social Connections: Not on file  ?Intimate Partner Violence: Not on file  ? ? ? ? ?Review of Systems  ?Constitutional:  Negative for chills, fatigue and unexpected weight change.  ?HENT:  Negative for congestion, postnasal drip, rhinorrhea, sneezing and sore throat.   ?Eyes:  Negative for redness.  ?Respiratory:  Negative for cough, chest tightness and shortness of breath.   ?Cardiovascular:  Negative for chest pain and palpitations.  ?Gastrointestinal:  Negative for abdominal pain, constipation, diarrhea, nausea and vomiting.  ?Genitourinary:  Negative for dysuria and frequency.  ?Musculoskeletal:  Negative for arthralgias, back pain, joint swelling and neck pain.  ?Skin:  Negative for rash.  ?Neurological:  Negative for tremors and numbness.  ?Hematological:  Negative for adenopathy. Does not bruise/bleed easily.  ?Psychiatric/Behavioral:  Negative for behavioral problems (Depression), sleep disturbance and suicidal ideas. The patient is not nervous/anxious.   ? ?Vital Signs: ?BP 138/88 Comment: 142/93  Pulse 72   Temp  98.3 ?F (36.8 ?C)   Ht 5\' 7"  (1.702 m)   Wt 194 lb 12.8 oz (88.4 kg)   SpO2 98%   BMI 30.51 kg/m?  ? ? ?Physical Exam ?Vitals and nursing note reviewed.  ?Constitutional:   ?   Appearance: Normal appearance. He is obese.  ?HENT:  ?   Head: Normocephalic and atraumatic.  ?Eyes:  ?   Extraocular Movements: Extraocular movements intact.  ?   Pupils: Pupils are equal, round, and reactive to light.  ?Cardiovascular:  ?   Rate and Rhythm: Normal rate and regular rhythm.  ?   Pulses: Normal pulses.  ?   Heart sounds: Normal heart sounds.  ?Pulmonary:  ?   Effort: Pulmonary effort is normal.  ?   Breath sounds: Normal breath sounds.  ?Abdominal:  ?   General: Abdomen is flat. Bowel sounds are normal.  ?   Palpations: Abdomen  is soft.  ?   Tenderness: There is no abdominal tenderness.  ?Musculoskeletal:     ?   General: Normal range of motion.  ?   Cervical back: Normal range of motion.  ?Skin: ?   General: Skin is warm.  ?Neurological:  ?   General: No focal deficit present.  ?   Mental Status: He is alert and oriented to person, place, and time. Mental status is at baseline.  ?Psychiatric:     ?   Mood and Affect: Mood normal.     ?   Behavior: Behavior normal.     ?   Thought Content: Thought content normal.     ?   Judgment: Judgment normal.  ? ? ? ? ? ?Assessment/Plan: ?1. Type 2 diabetes mellitus with hyperglycemia, without long-term current use of insulin (HCC) ?- POCT HgB A1C is 7.4 which is slightly improved from 7.5 last check. He has not been compliant with medications for most of the time and will do better about this now. Will continue current medications as prescribed and work on diet and exercise. ? ?2. Mixed hyperlipidemia ?Tolerating crestor, continue daily ? ?3. Gastroesophageal reflux disease without esophagitis ?Continue pantoprazole ? ? ?General Counseling: Tagen verbalizes understanding of the findings of todays visit and agrees with plan of treatment. I have discussed any further diagnostic  evaluation that may be needed or ordered today. We also reviewed his medications today. he has been encouraged to call the office with any questions or concerns that should arise related to todays visit. ? ? ? ?Orders Placed

## 2022-03-17 ENCOUNTER — Other Ambulatory Visit: Payer: Self-pay | Admitting: Physician Assistant

## 2022-05-03 ENCOUNTER — Encounter: Payer: Self-pay | Admitting: Physician Assistant

## 2022-05-03 ENCOUNTER — Ambulatory Visit: Payer: BC Managed Care – PPO | Admitting: Physician Assistant

## 2022-05-03 VITALS — BP 142/92 | HR 75 | Temp 97.6°F | Resp 16 | Ht 67.0 in | Wt 194.6 lb

## 2022-05-03 DIAGNOSIS — E1165 Type 2 diabetes mellitus with hyperglycemia: Secondary | ICD-10-CM | POA: Diagnosis not present

## 2022-05-03 DIAGNOSIS — I10 Essential (primary) hypertension: Secondary | ICD-10-CM | POA: Diagnosis not present

## 2022-05-03 DIAGNOSIS — Z91148 Patient's other noncompliance with medication regimen for other reason: Secondary | ICD-10-CM | POA: Diagnosis not present

## 2022-05-03 DIAGNOSIS — E782 Mixed hyperlipidemia: Secondary | ICD-10-CM

## 2022-05-03 LAB — POCT GLYCOSYLATED HEMOGLOBIN (HGB A1C): Hemoglobin A1C: 8.1 % — AB (ref 4.0–5.6)

## 2022-05-03 MED ORDER — DAPAGLIFLOZIN PROPANEDIOL 10 MG PO TABS: 10.0000 mg | ORAL_TABLET | Freq: Every day | ORAL | 1 refills | Status: DC

## 2022-05-03 NOTE — Progress Notes (Signed)
[facility] [address] [place], [place] [address]  Internal MEDICINE  Office Visit Note  Patient Name: Ronnie Mejia  [id number]  [id number]  Date of Service: 05/03/2022  Chief Complaint  Patient presents with   Follow-up   Diabetes   Gastroesophageal Reflux   Hypertension   Quality Metric Gaps    Colonoscopy    HPI Pt is here for routine follow up -Pt has not been checking BG still and again discussed the importance of this -Has not been taking his medications every day. He states he either takes all of them together or none of them if he is busy and forgets. Discussed the importance of med adherence and advised to set alarm/reminder to take meds daily. Also discussed it would be advised to take Farxiga in Am and that glimepiride is supposed to be BID and janumet can be in afternoon like he has been. He reports he only takes 1 tab of glimepiride and that he takes the 3 meds all at once in afternoon. Discussed increase to two tabs of glimepiride but needs to watch sugars as they could drop if he starts taking meds daily as prescribed and may not need higher dose.  -does state he was bit by tick back on memorial day weekend and thinks maybe this caused sugar to spike too. No joint pain. Did not seek care for this but did make sure he got the entire tick off. Discussed in future to let us know if tick bite especially if any symptoms arise. -Borderline BP in office and the same on recheck. Still has not gotten a BP cuff and will look into this. If continued elevation in future, he will need low dose bp med  Current Medication: Outpatient Encounter Medications as of 05/03/2022  Medication Sig   Accu-Chek FastClix Lancets MISC Use as directed twice daily diag E11.65   clomiPHENE (CLOMID) 50 MG tablet Take 1/2 tablet daily.   clomiPHENE (CLOMID) 50 MG tablet Take 1/2 tablet daily   glimepiride (AMARYL) 2 MG tablet Take 1 tablet (2 mg total) by mouth 2 (two) times daily.    glucose blood (ACCU-CHEK GUIDE) test strip 1 each by Other route 2 (two) times daily. Use as instructed twice a daily e11.65   pantoprazole (PROTONIX) 40 MG tablet Take 1 tablet (40 mg total) by mouth daily.   rosuvastatin (CRESTOR) 5 MG tablet TAKE 1 TABLET (5 MG TOTAL) BY MOUTH DAILY. TAKE 1 TABLET BY MOUTH DAILY FOR CHOLESTEROL   SitaGLIPtin-MetFORMIN HCl (JANUMET XR) [phone] MG TB24 Take 1 tablet by mouth daily at 12 noon.   tadalafil (CIALIS) 20 MG tablet Take 1 tablet (20 mg total) by mouth daily.   [DISCONTINUED] dapagliflozin propanediol (FARXIGA) 10 MG TABS tablet Take 1 tablet (10 mg total) by mouth daily.   dapagliflozin propanediol (FARXIGA) 10 MG TABS tablet Take 1 tablet (10 mg total) by mouth daily.   No facility-administered encounter medications on file as of 05/03/2022.    Surgical History: Past Surgical History:  Procedure Laterality Date   ESOPHAGOGASTRODUODENOSCOPY (EGD) WITH PROPOFOL N/A 02/28/2018   Procedure: ESOPHAGOGASTRODUODENOSCOPY (EGD) WITH PROPOFOL;  Surgeon: Jeani Hawking, MD;  Location: [facility];  Service: Endoscopy;  Laterality: N/A;   ESOPHAGOGASTRODUODENOSCOPY (EGD) WITH PROPOFOL N/A 04/18/2018   Procedure: ESOPHAGOGASTRODUODENOSCOPY (EGD) WITH PROPOFOL;  Surgeon: Jeani Hawking, MD;  Location: [facility];  Service: Endoscopy;  Laterality: N/A;   HOT HEMOSTASIS N/A 02/28/2018   Procedure: HOT HEMOSTASIS (ARGON PLASMA COAGULATION/BICAP);  Surgeon: Jeani Hawking,  MD;  Location: [facility];  Service: Endoscopy;  Laterality: N/A;   HOT HEMOSTASIS N/A 04/18/2018   Procedure: HOT HEMOSTASIS (ARGON PLASMA COAGULATION/BICAP);  Surgeon: Jeani Hawking, MD;  Location: [facility];  Service: Endoscopy;  Laterality: N/A;   NASAL SEPTOPLASTY W/ TURBINOPLASTY Bilateral 10/17/2015   Procedure: Nasal Septoplasty, Bilateral Partial Reduction Inferior Turbinates ;  Surgeon: Vernie Murders, MD;  Location: [facility];  Service: ENT;  Laterality: Bilateral;   SHOULDER SURGERY Right     ARTHROSCOPIC   WISDOM TOOTH EXTRACTION      Medical History: Past Medical History:  Diagnosis Date   Anemia    Arthritis    Complication of anesthesia    PT WAS SHEDULED TO HAVE IGSS AT [facility] ON 10-06-15 AND AFTER INDUCTION PT ASPIRATED SCANT AMOUNT OF GASTRIC CONTENTS AND CASE WAS CANCELLED   Deviated septum    Diabetes mellitus (HCC)    on metformin   GERD (gastroesophageal reflux disease)    Hoarseness of voice    SINCE BEING INTUBATED ON 10-06-15   Hypertension    PT DENIES DURING 10-14-15 PHONE INTERVIEW    Nasal congestion    LONG TERM OBSTRUCTION/ENLARGED TURBINATES   Pancreatitis 05/2014   Pancreatitis    PONV (postoperative nausea and vomiting)    Sleep apnea     had surgery no longer uses cpap    Family History: Family History  Problem Relation Age of Onset   Diabetes Mother    Hypertension Mother    Diabetes Father    Cancer Other        paternal aunt    Social History   Socioeconomic History   Marital status: Married    Spouse name: Not on file   Number of children: Not on file   Years of education: Not on file   Highest education level: Not on file  Occupational History   Occupation: Sports administrator: Sports coach  Tobacco Use   Smoking status: Never   Smokeless tobacco: Never  Vaping Use   Vaping Use: Never used  Substance and Sexual Activity   Alcohol use: Yes    Comment: occasional   Drug use: No   Sexual activity: Yes  Other Topics Concern   Not on file  Social History Narrative   Not on file   Social Determinants of Health   Financial Resource Strain: Not on file  Food Insecurity: Not on file  Transportation Needs: Not on file  Physical Activity: Not on file  Stress: Not on file  Social Connections: Not on file  Intimate Partner Violence: Not on file      Review of Systems  Constitutional:  Negative for chills, fatigue and unexpected weight change.  HENT:  Negative for congestion, postnasal drip,  rhinorrhea, sneezing and sore throat.   Eyes:  Negative for redness.  Respiratory:  Negative for cough, chest tightness and shortness of breath.   Cardiovascular:  Negative for chest pain and palpitations.  Gastrointestinal:  Negative for abdominal pain, constipation, diarrhea, nausea and vomiting.  Genitourinary:  Negative for dysuria and frequency.  Musculoskeletal:  Negative for arthralgias, back pain, joint swelling and neck pain.  Skin:  Negative for rash.  Neurological:  Negative for tremors and numbness.  Hematological:  Negative for adenopathy. Does not bruise/bleed easily.  Psychiatric/Behavioral:  Negative for behavioral problems (Depression), sleep disturbance and suicidal ideas. The patient is not nervous/anxious.     Vital Signs: BP (!) 142/92   Pulse 75  Temp 97.6 F (36.4 C)   Resp 16   Ht 5\' 7"  (1.702 m)   Wt 194 lb 9.6 oz (88.3 kg)   SpO2 96%   BMI 30.48 kg/m    Physical Exam Vitals and nursing note reviewed.  Constitutional:      Appearance: Normal appearance. He is obese.  HENT:     Head: Normocephalic and atraumatic.  Eyes:     Extraocular Movements: Extraocular movements intact.     Pupils: Pupils are equal, round, and reactive to light.  Cardiovascular:     Rate and Rhythm: Normal rate and regular rhythm.     Pulses: Normal pulses.     Heart sounds: Normal heart sounds.  Pulmonary:     Effort: Pulmonary effort is normal.     Breath sounds: Normal breath sounds.  Abdominal:     General: Abdomen is flat. Bowel sounds are normal.     Palpations: Abdomen is soft.     Tenderness: There is no abdominal tenderness.  Musculoskeletal:        General: Normal range of motion.     Cervical back: Normal range of motion.  Skin:    General: Skin is warm.  Neurological:     General: No focal deficit present.     Mental Status: He is alert and oriented to person, place, and time. Mental status is at baseline.  Psychiatric:        Mood and Affect: Mood  normal.        Behavior: Behavior normal.        Thought Content: Thought content normal.        Judgment: Judgment normal.        Assessment/Plan: 1. Type 2 diabetes mellitus with hyperglycemia, without long-term current use of insulin (HCC) - POCT HgB A1C is 8.1 which is significantly increased from 7.4 last check. Discussed the importance of taking meds daily as prescribed. May need to take 2 tabs of glimepiride as previously prescribed though cautioned to check BG if he takes all meds at once. Will work on diet and exercise - dapagliflozin propanediol (FARXIGA) 10 MG TABS tablet; Take 1 tablet (10 mg total) by mouth daily.  Dispense: 90 tablet; Refill: 1  2. Essential hypertension Borderline elevated in office and not on any BP meds currently. Will monitor and if continued elevation may need low dose bp med in future  3. Mixed hyperlipidemia Continue crestor daily  4. Nonadherence to medication Again discussed the importance of medication adherence and the negative health consequences of not doing so. Pt expressed understanding. Advised to set reminder on phone to take meds daily.   General Counseling: Dawood verbalizes understanding of the findings of todays visit and agrees with plan of treatment. I have discussed any further diagnostic evaluation that may be needed or ordered today. We also reviewed his medications today. he has been encouraged to call the office with any questions or concerns that should arise related to todays visit.    Orders Placed This Encounter  Procedures   POCT HgB A1C    Meds ordered this encounter  Medications   dapagliflozin propanediol (FARXIGA) 10 MG TABS tablet    Sig: Take 1 tablet (10 mg total) by mouth daily.    Dispense:  90 tablet    Refill:  1    FARXIGA case #:   valid from 10/17/21 to 11/16/2021    This patient was seen by 11/18/2021, PA-C in collaboration with Dr. Lynn Ito  as a part of collaborative care  agreement.   Total time spent:30 Minutes Time spent includes review of chart, medications, test results, and follow up plan with the patient.      Dr Lyndon Code Internal medicine

## 2022-05-03 NOTE — Addendum Note (Signed)
Addended by: Loura Back B on: 05/03/2022 10:14 AM   Modules accepted: Orders

## 2022-05-04 ENCOUNTER — Ambulatory Visit: Payer: BC Managed Care – PPO | Admitting: Physician Assistant

## 2022-05-07 ENCOUNTER — Ambulatory Visit: Payer: BC Managed Care – PPO | Admitting: Physician Assistant

## 2022-05-11 ENCOUNTER — Other Ambulatory Visit: Payer: Self-pay | Admitting: Physician Assistant

## 2022-05-11 DIAGNOSIS — E1165 Type 2 diabetes mellitus with hyperglycemia: Secondary | ICD-10-CM

## 2022-05-11 DIAGNOSIS — K219 Gastro-esophageal reflux disease without esophagitis: Secondary | ICD-10-CM

## 2022-07-03 ENCOUNTER — Telehealth (INDEPENDENT_AMBULATORY_CARE_PROVIDER_SITE_OTHER): Payer: BC Managed Care – PPO | Admitting: Nurse Practitioner

## 2022-07-03 ENCOUNTER — Encounter: Payer: Self-pay | Admitting: Nurse Practitioner

## 2022-07-03 VITALS — Ht 67.0 in | Wt 194.0 lb

## 2022-07-03 DIAGNOSIS — T781XXA Other adverse food reactions, not elsewhere classified, initial encounter: Secondary | ICD-10-CM

## 2022-07-03 DIAGNOSIS — S30860S Insect bite (nonvenomous) of lower back and pelvis, sequela: Secondary | ICD-10-CM

## 2022-07-03 NOTE — Progress Notes (Signed)
[facility] [address] [place], [place] [address]  Internal MEDICINE  Telephone Visit  Patient Name: Ronnie Mejia  [id number]  [id number]  Date of Service: 07/03/2022  I connected with the patient at 1500 by telephone and verified the patients identity using two identifiers.   I discussed the limitations, risks, security and privacy concerns of performing an evaluation and management service by telephone and the availability of in person appointments. I also discussed with the patient that there may be a patient responsible charge related to the service.  The patient expressed understanding and agrees to proceed.    Chief Complaint  Patient presents with   Telephone Assessment    [phone] preferred telephone    Telephone Screen    Food allergy test    Diarrhea   Nausea    HPI Ronnie Mejia presents for a telehealth virtual visit for possible adverse reaction to red meat.  Last Friday, he ate pork tacos from a [place]. Was feeling fine before dinner. Shortly after eating the pork tacos, he started having nauea, headache and diarrhea. Thought he might have a stomach bug or ate something that made him sick. On Saturday, he had steak for dinner and shortly after had the same symptoms. On Sunday, he had filet mignon and had the same symptoms occur after dinner on Sunday as well. Had a tick bite on memorial weekend in may. Worried this may be a tick that can cause the alpha-gal red meat allergy.  Wants to have blood work done to test for this type of reaction.    Current Medication: Outpatient Encounter Medications as of 07/03/2022  Medication Sig   Accu-Chek FastClix Lancets MISC Use as directed twice daily diag E11.65   clomiPHENE (CLOMID) 50 MG tablet Take 1/2 tablet daily.   clomiPHENE (CLOMID) 50 MG tablet Take 1/2 tablet daily   dapagliflozin propanediol (FARXIGA) 10 MG TABS tablet Take 1 tablet (10 mg total) by mouth daily.   glimepiride (AMARYL) 2 MG tablet  TAKE 1 TABLET BY MOUTH 2 TIMES DAILY.   glucose blood (ACCU-CHEK GUIDE) test strip 1 each by Other route 2 (two) times daily. Use as instructed twice a daily e11.65   pantoprazole (PROTONIX) 40 MG tablet TAKE 1 TABLET BY MOUTH EVERY DAY   rosuvastatin (CRESTOR) 5 MG tablet TAKE 1 TABLET (5 MG TOTAL) BY MOUTH DAILY. TAKE 1 TABLET BY MOUTH DAILY FOR CHOLESTEROL   SitaGLIPtin-MetFORMIN HCl (JANUMET XR) [phone] MG TB24 Take 1 tablet by mouth daily at 12 noon.   tadalafil (CIALIS) 20 MG tablet Take 1 tablet (20 mg total) by mouth daily.   No facility-administered encounter medications on file as of 07/03/2022.    Surgical History: Past Surgical History:  Procedure Laterality Date   ESOPHAGOGASTRODUODENOSCOPY (EGD) WITH PROPOFOL N/A 02/28/2018   Procedure: ESOPHAGOGASTRODUODENOSCOPY (EGD) WITH PROPOFOL;  Surgeon: Jeani Hawking, MD;  Location: [facility];  Service: Endoscopy;  Laterality: N/A;   ESOPHAGOGASTRODUODENOSCOPY (EGD) WITH PROPOFOL N/A 04/18/2018   Procedure: ESOPHAGOGASTRODUODENOSCOPY (EGD) WITH PROPOFOL;  Surgeon: Jeani Hawking, MD;  Location: [facility];  Service: Endoscopy;  Laterality: N/A;   HOT HEMOSTASIS N/A 02/28/2018   Procedure: HOT HEMOSTASIS (ARGON PLASMA COAGULATION/BICAP);  Surgeon: Jeani Hawking, MD;  Location: [facility];  Service: Endoscopy;  Laterality: N/A;   HOT HEMOSTASIS N/A 04/18/2018   Procedure: HOT HEMOSTASIS (ARGON PLASMA COAGULATION/BICAP);  Surgeon: Jeani Hawking, MD;  Location: [facility];  Service: Endoscopy;  Laterality: N/A;   NASAL SEPTOPLASTY W/ TURBINOPLASTY Bilateral 10/17/2015  Procedure: Nasal Septoplasty, Bilateral Partial Reduction Inferior Turbinates ;  Surgeon: Vernie Murders, MD;  Location: [facility];  Service: ENT;  Laterality: Bilateral;   SHOULDER SURGERY Right    ARTHROSCOPIC   WISDOM TOOTH EXTRACTION      Medical History: Past Medical History:  Diagnosis Date   Anemia    Arthritis    Complication of anesthesia    PT WAS SHEDULED TO  HAVE IGSS AT [facility] ON 10-06-15 AND AFTER INDUCTION PT ASPIRATED SCANT AMOUNT OF GASTRIC CONTENTS AND CASE WAS CANCELLED   Deviated septum    Diabetes mellitus (HCC)    on metformin   GERD (gastroesophageal reflux disease)    Hoarseness of voice    SINCE BEING INTUBATED ON 10-06-15   Hypertension    PT DENIES DURING 10-14-15 PHONE INTERVIEW    Nasal congestion    LONG TERM OBSTRUCTION/ENLARGED TURBINATES   Pancreatitis 05/2014   Pancreatitis    PONV (postoperative nausea and vomiting)    Sleep apnea     had surgery no longer uses cpap    Family History: Family History  Problem Relation Age of Onset   Diabetes Mother    Hypertension Mother    Diabetes Father    Cancer Other        paternal aunt    Social History   Socioeconomic History   Marital status: Married    Spouse name: Not on file   Number of children: Not on file   Years of education: Not on file   Highest education level: Not on file  Occupational History   Occupation: Sports administrator: Sports coach  Tobacco Use   Smoking status: Never   Smokeless tobacco: Never  Vaping Use   Vaping Use: Never used  Substance and Sexual Activity   Alcohol use: Yes    Comment: occasional   Drug use: No   Sexual activity: Yes  Other Topics Concern   Not on file  Social History Narrative   Not on file   Social Determinants of Health   Financial Resource Strain: Not on file  Food Insecurity: Not on file  Transportation Needs: Not on file  Physical Activity: Not on file  Stress: Not on file  Social Connections: Not on file  Intimate Partner Violence: Not on file      Review of Systems  Constitutional:  Positive for fatigue. Negative for chills and unexpected weight change.  HENT:  Negative for congestion, rhinorrhea, sneezing and sore throat.   Eyes:  Negative for redness.  Respiratory:  Negative for cough, chest tightness and shortness of breath.   Cardiovascular:  Negative for chest pain and  palpitations.  Gastrointestinal:  Positive for diarrhea and nausea. Negative for abdominal pain, constipation and vomiting.  Genitourinary:  Negative for dysuria and frequency.  Musculoskeletal:  Negative for arthralgias, back pain, joint swelling and neck pain.  Skin:  Negative for rash.  Neurological:  Positive for headaches. Negative for tremors and numbness.  Hematological:  Negative for adenopathy. Does not bruise/bleed easily.  Psychiatric/Behavioral:  Negative for behavioral problems (Depression), sleep disturbance and suicidal ideas. The patient is not nervous/anxious.     Vital Signs: Ht 5\' 7"  (1.702 m)   Wt 194 lb (88 kg)   BMI 30.38 kg/m    Observation/Objective: He is alert and oriented and engages in conversation appropriately. He does not sound as though he is in any acute distress over telephone call.     Assessment/Plan: 1. 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 Will call patient with results.  - Alpha-Gal Panel  2. Tick bite of lower back, sequela Lab ordered.  - Alpha-Gal Panel   General Counseling: Kees verbalizes understanding of the findings of today's phone visit and agrees with plan of treatment. I have discussed any further diagnostic evaluation that may be needed or ordered today. We also reviewed his medications today. he has been encouraged to call the office with any questions or concerns that should arise related to todays visit.  Return if symptoms worsen or fail to improve.   Orders Placed This Encounter  Procedures   Alpha-Gal Panel    No orders of the defined types were placed in this encounter.   Time spent:10 Minutes Time spent with patient included reviewing progress notes, labs, imaging studies, and discussing plan for follow up.  [place] Controlled Substance Database was reviewed by me for overdose risk score (ORS) if appropriate.  This patient was seen by Sallyanne Kuster, FNP-C in collaboration with Dr. Beverely Risen as a  part of collaborative care agreement.  Jacelynn Hayton R. Tedd Sias, MSN, FNP-C Internal medicine

## 2022-07-04 DIAGNOSIS — S30860S Insect bite (nonvenomous) of lower back and pelvis, sequela: Secondary | ICD-10-CM | POA: Diagnosis not present

## 2022-07-04 DIAGNOSIS — W57XXXS Bitten or stung by nonvenomous insect and other nonvenomous arthropods, sequela: Secondary | ICD-10-CM | POA: Diagnosis not present

## 2022-07-04 DIAGNOSIS — T781XXA Other adverse food reactions, not elsewhere classified, initial encounter: Secondary | ICD-10-CM | POA: Diagnosis not present

## 2022-07-06 LAB — ALPHA-GAL PANEL
Allergen Lamb IgE: 4.11 kU/L — AB
Beef IgE: 5.34 kU/L — AB
IgE (Immunoglobulin E), Serum: 640 IU/mL — ABNORMAL HIGH (ref 6–495)
O215-IgE Alpha-Gal: 10.9 kU/L — AB
Pork IgE: 3.14 kU/L — AB

## 2022-07-26 ENCOUNTER — Ambulatory Visit: Payer: BC Managed Care – PPO | Admitting: Physician Assistant

## 2022-07-26 ENCOUNTER — Encounter: Payer: Self-pay | Admitting: Physician Assistant

## 2022-07-26 VITALS — BP 140/90 | HR 86 | Temp 97.8°F | Resp 16 | Ht 67.0 in | Wt 193.6 lb

## 2022-07-26 DIAGNOSIS — Z91018 Allergy to other foods: Secondary | ICD-10-CM | POA: Diagnosis not present

## 2022-07-26 DIAGNOSIS — E782 Mixed hyperlipidemia: Secondary | ICD-10-CM | POA: Diagnosis not present

## 2022-07-26 DIAGNOSIS — E1165 Type 2 diabetes mellitus with hyperglycemia: Secondary | ICD-10-CM | POA: Diagnosis not present

## 2022-07-26 DIAGNOSIS — I10 Essential (primary) hypertension: Secondary | ICD-10-CM

## 2022-07-26 NOTE — Progress Notes (Signed)
[facility] [address] [place], [place] [address]  Internal MEDICINE  Office Visit Note  Patient Name: Ronnie Mejia  [id number]  [id number]  Date of Service: 08/07/2022  Chief Complaint  Patient presents with   Follow-up   Diabetes   Gastroesophageal Reflux   Hypertension    HPI Pt is here for routine follow up -kept getting sick after eating red meat, no hives or swelling, just GI upset. Alpha gal did come back reactive -Did have goat cheese and that was fine -He has some friends with alpha-gal who went to a provider and had some acupuncture and have been able to eat meat again -Not checking sugars and still taking all his meds at once in afternoon, but did increase glimepiride  -Has not being feeling dizzy or lightheaded  -BP is borderline on recheck 140/90. He was a little stressed this morning as workers showed up to his house early when he was trying to leave for appt and may be contributing. Will monitor  Current Medication: Outpatient Encounter Medications as of 07/26/2022  Medication Sig   Accu-Chek FastClix Lancets MISC Use as directed twice daily diag E11.65   clomiPHENE (CLOMID) 50 MG tablet Take 1/2 tablet daily.   clomiPHENE (CLOMID) 50 MG tablet Take 1/2 tablet daily   dapagliflozin propanediol (FARXIGA) 10 MG TABS tablet Take 1 tablet (10 mg total) by mouth daily.   EPINEPHrine 0.3 mg/0.3 mL IJ SOAJ injection Inject 0.3 mg into the muscle as needed for anaphylaxis.   glimepiride (AMARYL) 2 MG tablet TAKE 1 TABLET BY MOUTH 2 TIMES DAILY.   glucose blood (ACCU-CHEK GUIDE) test strip 1 each by Other route 2 (two) times daily. Use as instructed twice a daily e11.65   pantoprazole (PROTONIX) 40 MG tablet TAKE 1 TABLET BY MOUTH EVERY DAY   rosuvastatin (CRESTOR) 5 MG tablet TAKE 1 TABLET (5 MG TOTAL) BY MOUTH DAILY. TAKE 1 TABLET BY MOUTH DAILY FOR CHOLESTEROL   SitaGLIPtin-MetFORMIN HCl (JANUMET XR) [phone] MG TB24 Take 1 tablet by mouth daily at 12  noon.   tadalafil (CIALIS) 20 MG tablet Take 1 tablet (20 mg total) by mouth daily.   No facility-administered encounter medications on file as of 07/26/2022.    Surgical History: Past Surgical History:  Procedure Laterality Date   ESOPHAGOGASTRODUODENOSCOPY (EGD) WITH PROPOFOL N/A 02/28/2018   Procedure: ESOPHAGOGASTRODUODENOSCOPY (EGD) WITH PROPOFOL;  Surgeon: Jeani Hawking, MD;  Location: [facility];  Service: Endoscopy;  Laterality: N/A;   ESOPHAGOGASTRODUODENOSCOPY (EGD) WITH PROPOFOL N/A 04/18/2018   Procedure: ESOPHAGOGASTRODUODENOSCOPY (EGD) WITH PROPOFOL;  Surgeon: Jeani Hawking, MD;  Location: [facility];  Service: Endoscopy;  Laterality: N/A;   HOT HEMOSTASIS N/A 02/28/2018   Procedure: HOT HEMOSTASIS (ARGON PLASMA COAGULATION/BICAP);  Surgeon: Jeani Hawking, MD;  Location: [facility];  Service: Endoscopy;  Laterality: N/A;   HOT HEMOSTASIS N/A 04/18/2018   Procedure: HOT HEMOSTASIS (ARGON PLASMA COAGULATION/BICAP);  Surgeon: Jeani Hawking, MD;  Location: [facility];  Service: Endoscopy;  Laterality: N/A;   NASAL SEPTOPLASTY W/ TURBINOPLASTY Bilateral 10/17/2015   Procedure: Nasal Septoplasty, Bilateral Partial Reduction Inferior Turbinates ;  Surgeon: Vernie Murders, MD;  Location: [facility];  Service: ENT;  Laterality: Bilateral;   SHOULDER SURGERY Right    ARTHROSCOPIC   WISDOM TOOTH EXTRACTION      Medical History: Past Medical History:  Diagnosis Date   Anemia    Arthritis    Complication of anesthesia    PT WAS SHEDULED TO HAVE IGSS AT [facility] ON  10-06-15 AND AFTER INDUCTION PT ASPIRATED SCANT AMOUNT OF GASTRIC CONTENTS AND CASE WAS CANCELLED   Deviated septum    Diabetes mellitus (HCC)    on metformin   GERD (gastroesophageal reflux disease)    Hoarseness of voice    SINCE BEING INTUBATED ON 10-06-15   Hypertension    PT DENIES DURING 10-14-15 PHONE INTERVIEW    Nasal congestion    LONG TERM OBSTRUCTION/ENLARGED TURBINATES   Pancreatitis 05/2014    Pancreatitis    PONV (postoperative nausea and vomiting)    Sleep apnea     had surgery no longer uses cpap    Family History: Family History  Problem Relation Age of Onset   Diabetes Mother    Hypertension Mother    Diabetes Father    Cancer Other        paternal aunt    Social History   Socioeconomic History   Marital status: Married    Spouse name: Not on file   Number of children: Not on file   Years of education: Not on file   Highest education level: Not on file  Occupational History   Occupation: Sports administrator: Sports coach  Tobacco Use   Smoking status: Never   Smokeless tobacco: Never  Vaping Use   Vaping Use: Never used  Substance and Sexual Activity   Alcohol use: Yes    Comment: occasional   Drug use: No   Sexual activity: Yes  Other Topics Concern   Not on file  Social History Narrative   Not on file   Social Determinants of Health   Financial Resource Strain: Not on file  Food Insecurity: Not on file  Transportation Needs: Not on file  Physical Activity: Not on file  Stress: Not on file  Social Connections: Not on file  Intimate Partner Violence: Not on file      Review of Systems  Constitutional:  Negative for chills, fatigue and unexpected weight change.  HENT:  Negative for congestion, postnasal drip, rhinorrhea, sneezing and sore throat.   Eyes:  Negative for redness.  Respiratory:  Negative for cough, chest tightness and shortness of breath.   Cardiovascular:  Negative for chest pain and palpitations.  Gastrointestinal:  Negative for abdominal pain, constipation, diarrhea, nausea and vomiting.  Genitourinary:  Negative for dysuria and frequency.  Musculoskeletal:  Negative for arthralgias, back pain, joint swelling and neck pain.  Skin:  Negative for rash.  Neurological:  Negative for tremors and numbness.  Hematological:  Negative for adenopathy. Does not bruise/bleed easily.  Psychiatric/Behavioral:  Negative for behavioral  problems (Depression), sleep disturbance and suicidal ideas. The patient is not nervous/anxious.     Vital Signs: BP (!) 140/90 Comment: 145/94  Pulse 86   Temp 97.8 F (36.6 C)   Resp 16   Ht 5\' 7"  (1.702 m)   Wt 193 lb 9.6 oz (87.8 kg)   SpO2 97%   BMI 30.32 kg/m    Physical Exam Vitals and nursing note reviewed.  Constitutional:      Appearance: Normal appearance. He is obese.  HENT:     Head: Normocephalic and atraumatic.  Eyes:     Extraocular Movements: Extraocular movements intact.     Pupils: Pupils are equal, round, and reactive to light.  Cardiovascular:     Rate and Rhythm: Normal rate and regular rhythm.     Pulses: Normal pulses.     Heart sounds: Normal heart sounds.  Pulmonary:  Effort: Pulmonary effort is normal.     Breath sounds: Normal breath sounds.  Abdominal:     General: Abdomen is flat. Bowel sounds are normal.     Palpations: Abdomen is soft.     Tenderness: There is no abdominal tenderness.  Musculoskeletal:        General: Normal range of motion.     Cervical back: Normal range of motion.  Skin:    General: Skin is warm.  Neurological:     General: No focal deficit present.     Mental Status: He is alert and oriented to person, place, and time. Mental status is at baseline.  Psychiatric:        Mood and Affect: Mood normal.        Behavior: Behavior normal.        Thought Content: Thought content normal.        Judgment: Judgment normal.        Assessment/Plan: 1. Type 2 diabetes mellitus with hyperglycemia, without long-term current use of insulin (HCC) Unfortunately too soon to check A1c in office today. Did increase glimepiride last visit and tolerating. Advised he needs to check BG at home and also advised to space out timing of medication rather than take all at once  2. Essential hypertension Borderline in office likely due to stress this morning before coming to appt. Advised he needs to monitor at home. Will re-evaluate  in 4 weeks  3. Allergy to alpha-gal Continue to avoid red meat - EPINEPHrine 0.3 mg/0.3 mL IJ SOAJ injection; Inject 0.3 mg into the muscle as needed for anaphylaxis.  Dispense: 1 each; Refill: 2  4. Mixed hyperlipidemia Continue crestor   General Counseling: Lenord verbalizes understanding of the findings of todays visit and agrees with plan of treatment. I have discussed any further diagnostic evaluation that may be needed or ordered today. We also reviewed his medications today. he has been encouraged to call the office with any questions or concerns that should arise related to todays visit.    No orders of the defined types were placed in this encounter.   Meds ordered this encounter  Medications   EPINEPHrine 0.3 mg/0.3 mL IJ SOAJ injection    Sig: Inject 0.3 mg into the muscle as needed for anaphylaxis.    Dispense:  1 each    Refill:  2    This patient was seen by Drema Dallas, PA-C in collaboration with Dr. Clayborn Bigness as a part of collaborative care agreement.   Total time spent:30 Minutes Time spent includes review of chart, medications, test results, and follow up plan with the patient.      Dr Lavera Guise Internal medicine

## 2022-08-01 ENCOUNTER — Telehealth: Payer: Self-pay

## 2022-08-01 MED ORDER — EPINEPHRINE 0.3 MG/0.3ML IJ SOAJ: 0.3000 mg | INTRAMUSCULAR | 2 refills | Status: AC | PRN

## 2022-08-01 NOTE — Telephone Encounter (Signed)
Lmom to call us back  he has alpha gal and we did the blood testing to confirm. he is avoiding meat now as it was making him have GI upset. he has not had any swelling reactions, just want to be safe and send epi pen

## 2022-08-02 ENCOUNTER — Telehealth: Payer: Self-pay

## 2022-08-02 NOTE — Telephone Encounter (Signed)
Pt notified for labs result and advised we send epipen as per lauren

## 2022-08-07 NOTE — Patient Instructions (Signed)
Alpha-gal Syndrome Alpha-gal syndrome (AGS) is an allergic reaction to a type of sugar commonly called alpha-gal. It is found in the meat and organ meats of mammals, such as cows, pigs, and sheep. It may also be found in products that come from animals, such as gelatin, medicines, medicine capsules, some milk products, vaccines, and cosmetics. AGS causes an allergic reaction that can be immediate or delayed for several hours and can range from mild to severe. A mild reaction may cause nausea, vomiting, or an itchy rash (hives). A severe reaction can cause breathing difficulties or loss of consciousness (anaphylaxis). This can be life-threatening. What are the causes? This allergy is first triggered by a tick bite from a lone star or blackleg tick. These ticks bite animals, such as cows, pigs, or sheep, and pick up the alpha-gal sugar from their blood. If the same tick bites you, it may cause your body's defense system (immune system) to produce antibodies to alpha-gal and cause the allergic reaction. What increases the risk? People who live in areas of the [place] where the lone star tick is common are at highest risk, these areas may include: [place]heastern. [place]. [place] into parts of [place]. People who are hunters and people who work in jobs that involve caring for trees (foresters) have an increased risk of this condition. What are the signs or symptoms? AGS may not cause an allergic reaction every time you eat red meat or come into contact with alpha-gal. If you do have a reaction, symptoms may include: Hives. Severe stomachache. Nausea or vomiting. Swelling of the lips, face, tongue, or throat. Making a high-pitched whistling sound when you breathe, most often when you breathe out (wheezing). Sneezing and runny nose. Headache. Symptoms of anaphylaxis may include: Difficulty breathing. Difficulty swallowing. Dizziness. Fainting. This may happen due to a sudden drop in  blood pressure. You may have AGS if you had anaphylaxis after eating something but do not have any known food allergies. Unlike other food allergies, the reaction does not start soon after the exposure to alpha-gal. There may be a delay of several hours. How is this diagnosed? This condition may be diagnosed based on signs and symptoms of the condition, especially if you have a history of tick bites and a delayed reaction to red meat. You may also have a blood test to check for antibodies to alpha-gal or a skin test to see if there is a reaction to alpha-gal. How is this treated? This condition may be treated by: Avoiding meat and organ meats that may contain alpha-gal. Avoiding medicines or other products that may contain alpha-gal. Using medicines to reduce an allergic reaction. Carrying an epinephrine auto-injector to use in case of a severe AGS reaction. AGS should be treated by an allergist or a health care provider who has experience with AGS. Follow these instructions at home:  Medicines Take over-the-counter and prescription medicines only as told by your health care provider. Follow instructions from your health care provider about when and how to use an epinephrine auto-injector. General instructions Avoid red meat and organ meat, and check food labels for meat-based ingredients in packaged foods such as soup, gravy, and flavoring. Work with your allergist to find what other foods or products you may need to avoid, some people may benefit from avoiding dairy. Keep all follow-up visits. This is important. How is this prevented? Take steps to prevent tick bites as frequent tick bites may increase the risk of an AGS reaction. These steps   include: Avoiding woods and fields with high grass. Wearing clothing protected with the anti-tick chemical permethrin when outdoors in these areas or using a U.S. Environmental Protection Agency-approved insect repellent. Checking your clothing for  ticks before you come back indoors. Checking your body for ticks when you take a shower. Checking your pets for ticks. Where to find more information Centers for Disease Control and Prevention: www.cdc.gov National Institute of Allergy and Infectious Diseases: www.niaid.nih.gov Contact a health care provider if: You have any signs or symptoms of AGS food allergy. You have a tick bite that causes a skin reaction. Get help right away if: You have a severe AGS reaction. You have trouble swallowing or breathing. These symptoms may represent a serious problem that is an emergency. Do not wait to see if the symptoms will go away. Get medical help right away. Call your local emergency services (911 in the [place] Do not drive yourself to the hospital. Summary AGS is a food allergy caused by a tick bite. Red meats, organ meats, and other products or medicines may trigger the alpha-gal reaction. AGS reactions can be mild or severe. If you have AGS, you should not eat red meat, organ meat, or products that may contain alpha-gal. Avoiding tick bites prevents AGS and more serious AGS reactions. This information is not intended to replace advice given to you by your health care provider. Make sure you discuss any questions you have with your health care provider. Document Revised: 01/31/2021 Document Reviewed: 01/31/2021 Elsevier Patient Education  2023 Elsevier Inc.  

## 2022-08-27 ENCOUNTER — Encounter: Payer: Self-pay | Admitting: Physician Assistant

## 2022-08-27 ENCOUNTER — Ambulatory Visit: Payer: BC Managed Care – PPO | Admitting: Physician Assistant

## 2022-08-27 VITALS — BP 128/90 | HR 82 | Temp 98.5°F | Resp 16 | Ht 67.0 in | Wt 195.8 lb

## 2022-08-27 DIAGNOSIS — E782 Mixed hyperlipidemia: Secondary | ICD-10-CM

## 2022-08-27 DIAGNOSIS — E1165 Type 2 diabetes mellitus with hyperglycemia: Secondary | ICD-10-CM

## 2022-08-27 LAB — POCT GLYCOSYLATED HEMOGLOBIN (HGB A1C): Hemoglobin A1C: 7.6 % — AB (ref 4.0–5.6)

## 2022-08-27 MED ORDER — JANUMET XR 100-1000 MG PO TB24: 1.0000 | ORAL_TABLET | Freq: Every day | ORAL | 2 refills | Status: DC

## 2022-08-27 NOTE — Progress Notes (Signed)
[facility] [address] [place], [place] [address]  Internal MEDICINE  Office Visit Note  Patient Name: Ronnie Mejia  [id number]  [id number]  Date of Service: 08/27/2022  Chief Complaint  Patient presents with   Follow-up   Diabetes   Gastroesophageal Reflux   Hypertension    HPI Pt is here for routine follow up -checking BG in AM some now and has been pretty good in low 100s -He does continues to take all of his medications midday rather than BID dosing, but feels this is the only way he will be compliant with the medications and reports no side effects from this.  -Did discuss considering a GLP1 medication in future to help A1c further if needed and he will think about this -BP did improve on recheck though still borderline and will continue to monitor  Current Medication: Outpatient Encounter Medications as of 08/27/2022  Medication Sig   Accu-Chek FastClix Lancets MISC Use as directed twice daily diag E11.65   clomiPHENE (CLOMID) 50 MG tablet Take 1/2 tablet daily.   clomiPHENE (CLOMID) 50 MG tablet Take 1/2 tablet daily   dapagliflozin propanediol (FARXIGA) 10 MG TABS tablet Take 1 tablet (10 mg total) by mouth daily.   EPINEPHrine 0.3 mg/0.3 mL IJ SOAJ injection Inject 0.3 mg into the muscle as needed for anaphylaxis.   glimepiride (AMARYL) 2 MG tablet TAKE 1 TABLET BY MOUTH 2 TIMES DAILY.   glucose blood (ACCU-CHEK GUIDE) test strip 1 each by Other route 2 (two) times daily. Use as instructed twice a daily e11.65   pantoprazole (PROTONIX) 40 MG tablet TAKE 1 TABLET BY MOUTH EVERY DAY   rosuvastatin (CRESTOR) 5 MG tablet TAKE 1 TABLET (5 MG TOTAL) BY MOUTH DAILY. TAKE 1 TABLET BY MOUTH DAILY FOR CHOLESTEROL   tadalafil (CIALIS) 20 MG tablet Take 1 tablet (20 mg total) by mouth daily.   [DISCONTINUED] SitaGLIPtin-MetFORMIN HCl (JANUMET XR) [id number] MG TB24 Take 1 tablet by mouth daily at 12 noon.   SitaGLIPtin-MetFORMIN HCl (JANUMET XR) [id number] MG TB24  Take 1 tablet by mouth daily at 12 noon.   No facility-administered encounter medications on file as of 08/27/2022.    Surgical History: Past Surgical History:  Procedure Laterality Date   ESOPHAGOGASTRODUODENOSCOPY (EGD) WITH PROPOFOL N/A 02/28/2018   Procedure: ESOPHAGOGASTRODUODENOSCOPY (EGD) WITH PROPOFOL;  Surgeon: Jeani Hawking, MD;  Location: [facility];  Service: Endoscopy;  Laterality: N/A;   ESOPHAGOGASTRODUODENOSCOPY (EGD) WITH PROPOFOL N/A 04/18/2018   Procedure: ESOPHAGOGASTRODUODENOSCOPY (EGD) WITH PROPOFOL;  Surgeon: Jeani Hawking, MD;  Location: [facility];  Service: Endoscopy;  Laterality: N/A;   HOT HEMOSTASIS N/A 02/28/2018   Procedure: HOT HEMOSTASIS (ARGON PLASMA COAGULATION/BICAP);  Surgeon: Jeani Hawking, MD;  Location: [facility];  Service: Endoscopy;  Laterality: N/A;   HOT HEMOSTASIS N/A 04/18/2018   Procedure: HOT HEMOSTASIS (ARGON PLASMA COAGULATION/BICAP);  Surgeon: Jeani Hawking, MD;  Location: [facility];  Service: Endoscopy;  Laterality: N/A;   NASAL SEPTOPLASTY W/ TURBINOPLASTY Bilateral 10/17/2015   Procedure: Nasal Septoplasty, Bilateral Partial Reduction Inferior Turbinates ;  Surgeon: Vernie Murders, MD;  Location: [facility];  Service: ENT;  Laterality: Bilateral;   SHOULDER SURGERY Right    ARTHROSCOPIC   WISDOM TOOTH EXTRACTION      Medical History: Past Medical History:  Diagnosis Date   Anemia    Arthritis    Complication of anesthesia    PT WAS SHEDULED TO HAVE IGSS AT [facility] ON 10-06-15 AND AFTER INDUCTION PT ASPIRATED SCANT AMOUNT OF GASTRIC  CONTENTS AND CASE WAS CANCELLED   Deviated septum    Diabetes mellitus (HCC)    on metformin   GERD (gastroesophageal reflux disease)    Hoarseness of voice    SINCE BEING INTUBATED ON 10-06-15   Hypertension    PT DENIES DURING 10-14-15 PHONE INTERVIEW    Nasal congestion    LONG TERM OBSTRUCTION/ENLARGED TURBINATES   Pancreatitis 05/2014   Pancreatitis    PONV (postoperative nausea  and vomiting)    Sleep apnea     had surgery no longer uses cpap    Family History: Family History  Problem Relation Age of Onset   Diabetes Mother    Hypertension Mother    Diabetes Father    Cancer Other        paternal aunt    Social History   Socioeconomic History   Marital status: Married    Spouse name: Not on file   Number of children: Not on file   Years of education: Not on file   Highest education level: Not on file  Occupational History   Occupation: Sports administrator: Sports coach  Tobacco Use   Smoking status: Never   Smokeless tobacco: Never  Vaping Use   Vaping Use: Never used  Substance and Sexual Activity   Alcohol use: Yes    Comment: occasional   Drug use: No   Sexual activity: Yes  Other Topics Concern   Not on file  Social History Narrative   Not on file   Social Determinants of Health   Financial Resource Strain: Not on file  Food Insecurity: Not on file  Transportation Needs: Not on file  Physical Activity: Not on file  Stress: Not on file  Social Connections: Not on file  Intimate Partner Violence: Not on file      Review of Systems  Constitutional:  Negative for chills, fatigue and unexpected weight change.  HENT:  Negative for congestion, postnasal drip, rhinorrhea, sneezing and sore throat.   Eyes:  Negative for redness.  Respiratory:  Negative for cough, chest tightness and shortness of breath.   Cardiovascular:  Negative for chest pain and palpitations.  Gastrointestinal:  Negative for abdominal pain, constipation, diarrhea, nausea and vomiting.  Genitourinary:  Negative for dysuria and frequency.  Musculoskeletal:  Negative for arthralgias, back pain, joint swelling and neck pain.  Skin:  Negative for rash.  Neurological:  Negative for tremors and numbness.  Hematological:  Negative for adenopathy. Does not bruise/bleed easily.  Psychiatric/Behavioral:  Negative for behavioral problems (Depression), sleep disturbance and  suicidal ideas. The patient is not nervous/anxious.     Vital Signs: BP (!) 128/90 Comment: 135/92  Pulse 82   Temp 98.5 F (36.9 C)   Resp 16   Ht 5\' 7"  (1.702 m)   Wt 195 lb 12.8 oz (88.8 kg)   SpO2 98%   BMI 30.67 kg/m    Physical Exam Vitals and nursing note reviewed.  Constitutional:      Appearance: Normal appearance. He is obese.  HENT:     Head: Normocephalic and atraumatic.  Eyes:     Pupils: Pupils are equal, round, and reactive to light.  Cardiovascular:     Rate and Rhythm: Normal rate and regular rhythm.     Pulses: Normal pulses.     Heart sounds: Normal heart sounds.  Pulmonary:     Effort: Pulmonary effort is normal.     Breath sounds: Normal breath sounds.  Abdominal:  General: Abdomen is flat. Bowel sounds are normal.     Palpations: Abdomen is soft.  Musculoskeletal:        General: Normal range of motion.     Cervical back: Normal range of motion.  Skin:    General: Skin is warm.  Neurological:     General: No focal deficit present.     Mental Status: He is alert and oriented to person, place, and time. Mental status is at baseline.  Psychiatric:        Mood and Affect: Mood normal.        Behavior: Behavior normal.        Thought Content: Thought content normal.        Judgment: Judgment normal.        Assessment/Plan: 1. Type 2 diabetes mellitus with hyperglycemia, without long-term current use of insulin (HCC) - POCT HgB A1C is 7.6 which is improved from 8.1 last visit and he is working on diet and exercise. Will Continue current medications though may consider adding GLP1 in future if not further improving. - Urine Microalbumin w/creat. ratio - SitaGLIPtin-MetFORMIN HCl (JANUMET XR) [phone] MG TB24; Take 1 tablet by mouth daily at 12 noon.  Dispense: 90 tablet; Refill: 2  2. Mixed hyperlipidemia Continue crestor   General Counseling: Blessed verbalizes understanding of the findings of todays visit and agrees with plan of  treatment. I have discussed any further diagnostic evaluation that may be needed or ordered today. We also reviewed his medications today. he has been encouraged to call the office with any questions or concerns that should arise related to todays visit.    Orders Placed This Encounter  Procedures   Urine Microalbumin w/creat. ratio   POCT HgB A1C    Meds ordered this encounter  Medications   SitaGLIPtin-MetFORMIN HCl (JANUMET XR) [phone] MG TB24    Sig: Take 1 tablet by mouth daily at 12 noon.    Dispense:  90 tablet    Refill:  2    JANUMET XR case #: [id number]  valid from 10/17/21 to 11/16/2021    This patient was seen by Drema Dallas, PA-C in collaboration with Dr. Clayborn Bigness as a part of collaborative care agreement.   Total time spent:30 Minutes Time spent includes review of chart, medications, test results, and follow up plan with the patient.      Dr Lavera Guise Internal medicine

## 2022-09-12 ENCOUNTER — Other Ambulatory Visit: Payer: Self-pay | Admitting: Physician Assistant

## 2022-09-12 DIAGNOSIS — E1165 Type 2 diabetes mellitus with hyperglycemia: Secondary | ICD-10-CM

## 2022-10-21 ENCOUNTER — Other Ambulatory Visit: Payer: Self-pay | Admitting: Physician Assistant

## 2022-11-06 ENCOUNTER — Telehealth: Payer: Self-pay | Admitting: Physician Assistant

## 2022-11-06 ENCOUNTER — Telehealth: Payer: Self-pay

## 2022-11-06 NOTE — Telephone Encounter (Signed)
MB full, sent mychart message to confirm 11/08/22 appointment-Toni

## 2022-11-06 NOTE — Telephone Encounter (Signed)
Completed P.A. for patient's [employer] and [employer] does cover medication.

## 2022-11-08 ENCOUNTER — Encounter: Payer: Self-pay | Admitting: Oncology

## 2022-11-08 ENCOUNTER — Encounter: Payer: Self-pay | Admitting: Physician Assistant

## 2022-11-08 ENCOUNTER — Ambulatory Visit (INDEPENDENT_AMBULATORY_CARE_PROVIDER_SITE_OTHER): Payer: BC Managed Care – PPO | Admitting: Physician Assistant

## 2022-11-08 VITALS — BP 132/88 | HR 77 | Temp 98.4°F | Resp 16 | Ht 67.0 in | Wt 199.2 lb

## 2022-11-08 DIAGNOSIS — R300 Dysuria: Secondary | ICD-10-CM | POA: Diagnosis not present

## 2022-11-08 DIAGNOSIS — E782 Mixed hyperlipidemia: Secondary | ICD-10-CM

## 2022-11-08 DIAGNOSIS — K219 Gastro-esophageal reflux disease without esophagitis: Secondary | ICD-10-CM

## 2022-11-08 DIAGNOSIS — E1165 Type 2 diabetes mellitus with hyperglycemia: Secondary | ICD-10-CM | POA: Diagnosis not present

## 2022-11-08 DIAGNOSIS — Z0001 Encounter for general adult medical examination with abnormal findings: Secondary | ICD-10-CM | POA: Diagnosis not present

## 2022-11-08 DIAGNOSIS — R5383 Other fatigue: Secondary | ICD-10-CM | POA: Diagnosis not present

## 2022-11-08 MED ORDER — DAPAGLIFLOZIN PROPANEDIOL 10 MG PO TABS: 10.0000 mg | ORAL_TABLET | Freq: Every day | ORAL | 1 refills | Status: DC

## 2022-11-08 MED ORDER — PANTOPRAZOLE SODIUM 40 MG PO TBEC: 40.0000 mg | DELAYED_RELEASE_TABLET | Freq: Every day | ORAL | 1 refills | Status: DC

## 2022-11-08 NOTE — Progress Notes (Signed)
[facility] [place], [place] [address]  Internal MEDICINE  Office Visit Note  Patient Name: Ronnie Mejia  [id number]  [id number]  Date of Service: 11/08/2022  Chief Complaint  Patient presents with   Annual Exam   Diabetes   Hypertension   Quality Metric Gaps    Eye Exam     HPI Pt is here for routine health maintenance examination -Due for diabetic eye exam and he will schedule this -States he had his colonoscopy the same date as first upper endoscopy in 2019 in [place]. Nothing found on colonoscopy, but this is when upper GI bleeding found and treated and led to repeat upper endo a month later to recheck. -foot exam-dry, normal -Not checking sugars, but is taking medications in AM and feels like it is improving. Due for A1c in a few weeks and will order this with routine blood work to have done first week of Feb. -unfortunately lost his father recently and funeral is later today. He reports managing as well as can be expected  Current Medication: Outpatient Encounter Medications as of 11/08/2022  Medication Sig   Accu-Chek FastClix Lancets MISC Use as directed twice daily diag E11.65   clomiPHENE (CLOMID) 50 MG tablet Take 1/2 tablet daily.   clomiPHENE (CLOMID) 50 MG tablet Take 1/2 tablet daily   EPINEPHrine 0.3 mg/0.3 mL IJ SOAJ injection Inject 0.3 mg into the muscle as needed for anaphylaxis.   glimepiride (AMARYL) 2 MG tablet TAKE 1 TABLET BY MOUTH TWICE A DAY   glucose blood (ACCU-CHEK GUIDE) test strip 1 each by Other route 2 (two) times daily. Use as instructed twice a daily e11.65   JANUMET XR [phone] MG TB24 TAKE 1 TABLET BY MOUTH DAILY AT 12 NOON.   rosuvastatin (CRESTOR) 5 MG tablet TAKE 1 TABLET (5 MG TOTAL) BY MOUTH DAILY. TAKE 1 TABLET BY MOUTH DAILY FOR CHOLESTEROL   tadalafil (CIALIS) 20 MG tablet Take 1 tablet (20 mg total) by mouth daily.   [DISCONTINUED] dapagliflozin propanediol (FARXIGA) 10 MG TABS tablet Take 1 tablet  (10 mg total) by mouth daily.   [DISCONTINUED] pantoprazole (PROTONIX) 40 MG tablet TAKE 1 TABLET BY MOUTH EVERY DAY   dapagliflozin propanediol (FARXIGA) 10 MG TABS tablet Take 1 tablet (10 mg total) by mouth daily.   pantoprazole (PROTONIX) 40 MG tablet Take 1 tablet (40 mg total) by mouth daily.   No facility-administered encounter medications on file as of 11/08/2022.    Surgical History: Past Surgical History:  Procedure Laterality Date   ESOPHAGOGASTRODUODENOSCOPY (EGD) WITH PROPOFOL N/A 02/28/2018   Procedure: ESOPHAGOGASTRODUODENOSCOPY (EGD) WITH PROPOFOL;  Surgeon: Carol Ada, MD;  Location: [facility];  Service: Endoscopy;  Laterality: N/A;   ESOPHAGOGASTRODUODENOSCOPY (EGD) WITH PROPOFOL N/A 04/18/2018   Procedure: ESOPHAGOGASTRODUODENOSCOPY (EGD) WITH PROPOFOL;  Surgeon: Carol Ada, MD;  Location: [facility];  Service: Endoscopy;  Laterality: N/A;   HOT HEMOSTASIS N/A 02/28/2018   Procedure: HOT HEMOSTASIS (ARGON PLASMA COAGULATION/BICAP);  Surgeon: Carol Ada, MD;  Location: [facility];  Service: Endoscopy;  Laterality: N/A;   HOT HEMOSTASIS N/A 04/18/2018   Procedure: HOT HEMOSTASIS (ARGON PLASMA COAGULATION/BICAP);  Surgeon: Carol Ada, MD;  Location: [facility];  Service: Endoscopy;  Laterality: N/A;   NASAL SEPTOPLASTY W/ TURBINOPLASTY Bilateral 10/17/2015   Procedure: Nasal Septoplasty, Bilateral Partial Reduction Inferior Turbinates ;  Surgeon: Margaretha Sheffield, MD;  Location: [facility];  Service: ENT;  Laterality: Bilateral;   SHOULDER SURGERY Right    ARTHROSCOPIC   WISDOM TOOTH  EXTRACTION      Medical History: Past Medical History:  Diagnosis Date   Anemia    Arthritis    Complication of anesthesia    PT WAS SHEDULED TO HAVE IGSS AT [facility] ON 10-06-15 AND AFTER INDUCTION PT ASPIRATED SCANT AMOUNT OF GASTRIC CONTENTS AND CASE WAS CANCELLED   Deviated septum    Diabetes mellitus (HCC)    on metformin   GERD (gastroesophageal reflux  disease)    Hoarseness of voice    SINCE BEING INTUBATED ON 10-06-15   Hypertension    PT DENIES DURING 10-14-15 PHONE INTERVIEW    Nasal congestion    LONG TERM OBSTRUCTION/ENLARGED TURBINATES   Pancreatitis 05/2014   Pancreatitis    PONV (postoperative nausea and vomiting)    Sleep apnea     had surgery no longer uses cpap    Family History: Family History  Problem Relation Age of Onset   Diabetes Mother    Hypertension Mother    Diabetes Father    Cancer Other        paternal aunt      Review of Systems  Constitutional:  Negative for chills, fatigue and unexpected weight change.  HENT:  Negative for congestion, postnasal drip, rhinorrhea, sneezing and sore throat.   Eyes:  Negative for redness.  Respiratory:  Negative for cough, chest tightness and shortness of breath.   Cardiovascular:  Negative for chest pain and palpitations.  Gastrointestinal:  Negative for abdominal pain, constipation, diarrhea, nausea and vomiting.  Genitourinary:  Negative for dysuria and frequency.  Musculoskeletal:  Negative for arthralgias, back pain, joint swelling and neck pain.  Skin:  Negative for rash.  Neurological:  Negative for tremors and numbness.  Hematological:  Negative for adenopathy. Does not bruise/bleed easily.  Psychiatric/Behavioral:  Negative for behavioral problems (Depression), sleep disturbance and suicidal ideas. The patient is not nervous/anxious.      Vital Signs: BP 132/88   Pulse 77   Temp 98.4 F (36.9 C)   Resp 16   Ht 5\' 7"  (1.702 m)   Wt 199 lb 3.2 oz (90.4 kg)   SpO2 98%   BMI 31.20 kg/m    Physical Exam Vitals and nursing note reviewed.  Constitutional:      Appearance: Normal appearance. He is obese.  HENT:     Head: Normocephalic and atraumatic.  Eyes:     Pupils: Pupils are equal, round, and reactive to light.  Cardiovascular:     Rate and Rhythm: Normal rate and regular rhythm.     Pulses: Normal pulses.          Dorsalis pedis pulses  are 2+ on the right side and 2+ on the left side.       Posterior tibial pulses are 2+ on the right side and 2+ on the left side.     Heart sounds: Normal heart sounds.  Pulmonary:     Effort: Pulmonary effort is normal.     Breath sounds: Normal breath sounds.  Abdominal:     General: Abdomen is flat. Bowel sounds are normal.     Palpations: Abdomen is soft.     Tenderness: There is no abdominal tenderness.  Musculoskeletal:        General: Normal range of motion.     Cervical back: Normal range of motion.  Feet:     Right foot:     Protective Sensation: 2 sites tested.  2 sites sensed.     Skin integrity: Dry  skin present.     Toenail Condition: Right toenails are normal.     Left foot:     Protective Sensation: 2 sites tested.  2 sites sensed.     Skin integrity: Dry skin present.     Toenail Condition: Left toenails are normal.  Skin:    General: Skin is warm.  Neurological:     General: No focal deficit present.     Mental Status: He is alert and oriented to person, place, and time. Mental status is at baseline.  Psychiatric:        Mood and Affect: Mood normal.        Behavior: Behavior normal.        Thought Content: Thought content normal.        Judgment: Judgment normal.      LABS: Recent Results (from the past 2160 hour(s))  POCT HgB A1C     Status: Abnormal   Collection Time: 08/27/22  8:51 AM  Result Value Ref Range   Hemoglobin A1C 7.6 (A) 4.0 - 5.6 %   HbA1c POC (<> result, manual entry)     HbA1c, POC (prediabetic range)     HbA1c, POC (controlled diabetic range)          Assessment/Plan: 1. Encounter for general adult medical examination with abnormal findings CPE performed, will order fasting routine labs along with A1c to be done first week of Feb. Due for eye exam - CBC w/Diff/Platelet - TSH + free T4 - Comprehensive metabolic panel - Lipid Panel With LDL/HDL Ratio - Hgb A1C w/o eAG  2. Uncontrolled type 2 diabetes mellitus with  hyperglycemia (HCC) Will check A1c with labs first week of feb and continue current medications - Microalbumin / creatinine urine ratio - dapagliflozin propanediol (FARXIGA) 10 MG TABS tablet; Take 1 tablet (10 mg total) by mouth daily.  Dispense: 90 tablet; Refill: 1 - Hgb A1C w/o eAG  3. Gastroesophageal reflux disease without esophagitis - pantoprazole (PROTONIX) 40 MG tablet; Take 1 tablet (40 mg total) by mouth daily.  Dispense: 90 tablet; Refill: 1  4. Mixed hyperlipidemia Continue crestor and will update labs - Lipid Panel With LDL/HDL Ratio  5. Other fatigue - CBC w/Diff/Platelet - TSH + free T4 - Comprehensive metabolic panel - Lipid Panel With LDL/HDL Ratio  6. Dysuria - UA/M w/rflx Culture, Routine   General Counseling: Esias verbalizes understanding of the findings of todays visit and agrees with plan of treatment. I have discussed any further diagnostic evaluation that may be needed or ordered today. We also reviewed his medications today. he has been encouraged to call the office with any questions or concerns that should arise related to todays visit.    Counseling:    Orders Placed This Encounter  Procedures   UA/M w/rflx Culture, Routine   Microalbumin / creatinine urine ratio   CBC w/Diff/Platelet   TSH + free T4   Comprehensive metabolic panel   Lipid Panel With LDL/HDL Ratio   Hgb A1C w/o eAG    Meds ordered this encounter  Medications   dapagliflozin propanediol (FARXIGA) 10 MG TABS tablet    Sig: Take 1 tablet (10 mg total) by mouth daily.    Dispense:  90 tablet    Refill:  1   pantoprazole (PROTONIX) 40 MG tablet    Sig: Take 1 tablet (40 mg total) by mouth daily.    Dispense:  90 tablet    Refill:  1    This patient was  seen by Lynn Ito, PA-C in collaboration with Dr. Beverely Risen as a part of collaborative care agreement.  Total time spent:35 Minutes  Time spent includes review of chart, medications, test results, and follow  up plan with the patient.     Lyndon Code, MD  Internal Medicine

## 2022-11-10 LAB — MICROALBUMIN / CREATININE URINE RATIO
Creatinine, Urine: 95.3 mg/dL
Microalb/Creat Ratio: 66 mg/g creat — ABNORMAL HIGH (ref 0–29)
Microalbumin, Urine: 62.7 ug/mL

## 2022-11-10 LAB — UA/M W/RFLX CULTURE, ROUTINE

## 2022-11-29 ENCOUNTER — Ambulatory Visit: Payer: 59 | Admitting: Physician Assistant

## 2022-12-18 DIAGNOSIS — R5383 Other fatigue: Secondary | ICD-10-CM | POA: Diagnosis not present

## 2022-12-18 DIAGNOSIS — Z0001 Encounter for general adult medical examination with abnormal findings: Secondary | ICD-10-CM | POA: Diagnosis not present

## 2022-12-18 DIAGNOSIS — E1165 Type 2 diabetes mellitus with hyperglycemia: Secondary | ICD-10-CM | POA: Diagnosis not present

## 2022-12-18 DIAGNOSIS — E782 Mixed hyperlipidemia: Secondary | ICD-10-CM | POA: Diagnosis not present

## 2022-12-19 ENCOUNTER — Other Ambulatory Visit: Payer: Self-pay

## 2022-12-19 ENCOUNTER — Telehealth: Payer: Self-pay

## 2022-12-19 LAB — COMPREHENSIVE METABOLIC PANEL
ALT: 20 IU/L (ref 0–44)
AST: 18 IU/L (ref 0–40)
Albumin/Globulin Ratio: 1.3 (ref 1.2–2.2)
Albumin: 4.3 g/dL (ref 4.1–5.1)
Alkaline Phosphatase: 73 IU/L (ref 44–121)
BUN/Creatinine Ratio: 14 (ref 9–20)
BUN: 11 mg/dL (ref 6–24)
Bilirubin Total: 0.5 mg/dL (ref 0.0–1.2)
CO2: 25 mmol/L (ref 20–29)
Calcium: 9.3 mg/dL (ref 8.7–10.2)
Chloride: 100 mmol/L (ref 96–106)
Creatinine, Ser: 0.8 mg/dL (ref 0.76–1.27)
Globulin, Total: 3.2 g/dL (ref 1.5–4.5)
Glucose: 146 mg/dL — ABNORMAL HIGH (ref 70–99)
Potassium: 4.1 mmol/L (ref 3.5–5.2)
Sodium: 141 mmol/L (ref 134–144)
Total Protein: 7.5 g/dL (ref 6.0–8.5)
eGFR: 108 mL/min/{1.73_m2} (ref >59)

## 2022-12-19 LAB — LIPID PANEL WITH LDL/HDL RATIO
Cholesterol, Total: 105 mg/dL (ref 100–199)
HDL: 29 mg/dL — ABNORMAL LOW (ref >39)
LDL Chol Calc (NIH): 57 mg/dL (ref 0–99)
LDL/HDL Ratio: 2 ratio (ref 0.0–3.6)
Triglycerides: 100 mg/dL (ref 0–149)
VLDL Cholesterol Cal: 19 mg/dL (ref 5–40)

## 2022-12-19 LAB — CBC WITH DIFFERENTIAL/PLATELET
Basophils Absolute: 0 10*3/uL (ref 0.0–0.2)
Basos: 1 %
EOS (ABSOLUTE): 0 10*3/uL (ref 0.0–0.4)
Eos: 0 %
Hematocrit: 43.7 % (ref 37.5–51.0)
Hemoglobin: 14.4 g/dL (ref 13.0–17.7)
Immature Grans (Abs): 0.1 10*3/uL (ref 0.0–0.1)
Immature Granulocytes: 1 %
Lymphocytes Absolute: 1.9 10*3/uL (ref 0.7–3.1)
Lymphs: 33 %
MCH: 27.1 pg (ref 26.6–33.0)
MCHC: 33 g/dL (ref 31.5–35.7)
MCV: 82 fL (ref 79–97)
Monocytes Absolute: 0.7 10*3/uL (ref 0.1–0.9)
Monocytes: 12 %
Neutrophils Absolute: 3.1 10*3/uL (ref 1.4–7.0)
Neutrophils: 53 %
Platelets: 237 10*3/uL (ref 150–450)
RBC: 5.31 x10E6/uL (ref 4.14–5.80)
RDW: 13.1 % (ref 11.6–15.4)
WBC: 5.8 10*3/uL (ref 3.4–10.8)

## 2022-12-19 LAB — TSH+FREE T4
Free T4: 1.21 ng/dL (ref 0.82–1.77)
TSH: 2.25 u[IU]/mL (ref 0.450–4.500)

## 2022-12-19 LAB — HGB A1C W/O EAG: Hgb A1c MFr Bld: 7.5 % — ABNORMAL HIGH (ref 4.8–5.6)

## 2022-12-19 MED ORDER — LISINOPRIL 2.5 MG PO TABS: 2.5000 mg | ORAL_TABLET | Freq: Every day | ORAL | 2 refills | Status: DC

## 2022-12-19 NOTE — Telephone Encounter (Signed)
-----   Message from Mylinda Latina, PA-C sent at 12/19/2022  1:06 PM EST ----- Please let him know that his cholesterol has greatly improved. His A1c is slightly improved at 7.5. His urine microalbumin/creatinine ratio is increased which can be an early sign of kidney dysfunction however his blood work for kidney function still looks good. A low dose medication used for BP can help with kidney protection and would recommend he start on this. If agreeable send 2.33m Lisinopril daily.

## 2022-12-19 NOTE — Telephone Encounter (Signed)
Pt notified for labs and he agreed to start lisinopril 2.5 mg daily  sent pres

## 2023-03-13 ENCOUNTER — Other Ambulatory Visit: Payer: Self-pay | Admitting: Physician Assistant

## 2023-03-13 DIAGNOSIS — E1165 Type 2 diabetes mellitus with hyperglycemia: Secondary | ICD-10-CM

## 2023-03-14 ENCOUNTER — Encounter: Payer: Self-pay | Admitting: Physician Assistant

## 2023-03-14 ENCOUNTER — Ambulatory Visit (INDEPENDENT_AMBULATORY_CARE_PROVIDER_SITE_OTHER): Payer: 59 | Admitting: Physician Assistant

## 2023-03-14 VITALS — BP 115/75 | HR 81 | Temp 98.3°F | Resp 16 | Ht 67.0 in | Wt 200.8 lb

## 2023-03-14 DIAGNOSIS — R21 Rash and other nonspecific skin eruption: Secondary | ICD-10-CM

## 2023-03-14 DIAGNOSIS — I10 Essential (primary) hypertension: Secondary | ICD-10-CM | POA: Diagnosis not present

## 2023-03-14 DIAGNOSIS — E1165 Type 2 diabetes mellitus with hyperglycemia: Secondary | ICD-10-CM | POA: Diagnosis not present

## 2023-03-14 DIAGNOSIS — E782 Mixed hyperlipidemia: Secondary | ICD-10-CM

## 2023-03-14 MED ORDER — JANUMET XR 100-1000 MG PO TB24: 1.0000 | ORAL_TABLET | Freq: Every day | ORAL | 2 refills | Status: DC

## 2023-03-14 MED ORDER — DAPAGLIFLOZIN PROPANEDIOL 10 MG PO TABS: 10.0000 mg | ORAL_TABLET | Freq: Every day | ORAL | 1 refills | Status: DC

## 2023-03-14 MED ORDER — ROSUVASTATIN CALCIUM 5 MG PO TABS: 5.0000 mg | ORAL_TABLET | Freq: Every day | ORAL | 1 refills | Status: DC

## 2023-03-14 MED ORDER — LISINOPRIL 2.5 MG PO TABS: 2.5000 mg | ORAL_TABLET | Freq: Every day | ORAL | 1 refills | Status: DC

## 2023-03-14 NOTE — Progress Notes (Signed)
[facility] [address] [place], [place] [address]  Internal MEDICINE  Office Visit Note  Patient Name: Ronnie Mejia  [id number]  [id number]  Date of Service: 03/14/2023  Chief Complaint  Patient presents with   Follow-up   Diabetes   Gastroesophageal Reflux   Hypertension   Quality Metric Gaps    Eye Exam    HPI Pt is here for routine follow up -Does have rash on right side of stomach and a small place on right thigh. States he has a hx of psoriasis many years ago without any flares. It is a little itchy. Using a topical now that seems to be helping. Place on leg almost gone and stomach getting better. He is not sure of the name. Not seeing dermatology but may need to if having flares again. -tolerating lisinopril well, this was started due to borderline BP with elevated microalbumin ratio -Takes all of his medications in the morning and not checking sugars regularly and advised to do so  Current Medication: Outpatient Encounter Medications as of 03/14/2023  Medication Sig   Accu-Chek FastClix Lancets MISC Use as directed twice daily diag E11.65   clomiPHENE (CLOMID) 50 MG tablet Take 1/2 tablet daily.   clomiPHENE (CLOMID) 50 MG tablet Take 1/2 tablet daily   EPINEPHrine 0.3 mg/0.3 mL IJ SOAJ injection Inject 0.3 mg into the muscle as needed for anaphylaxis.   glimepiride (AMARYL) 2 MG tablet TAKE 1 TABLET BY MOUTH TWICE A DAY   glucose blood (ACCU-CHEK GUIDE) test strip 1 each by Other route 2 (two) times daily. Use as instructed twice a daily e11.65   pantoprazole (PROTONIX) 40 MG tablet Take 1 tablet (40 mg total) by mouth daily.   tadalafil (CIALIS) 20 MG tablet Take 1 tablet (20 mg total) by mouth daily.   [DISCONTINUED] dapagliflozin propanediol (FARXIGA) 10 MG TABS tablet Take 1 tablet (10 mg total) by mouth daily.   [DISCONTINUED] JANUMET XR [phone] MG TB24 TAKE 1 TABLET BY MOUTH DAILY AT 12 NOON.   [DISCONTINUED] lisinopril (ZESTRIL) 2.5 MG tablet Take  1 tablet (2.5 mg total) by mouth daily.   [DISCONTINUED] rosuvastatin (CRESTOR) 5 MG tablet TAKE 1 TABLET (5 MG TOTAL) BY MOUTH DAILY. TAKE 1 TABLET BY MOUTH DAILY FOR CHOLESTEROL   dapagliflozin propanediol (FARXIGA) 10 MG TABS tablet Take 1 tablet (10 mg total) by mouth daily.   lisinopril (ZESTRIL) 2.5 MG tablet Take 1 tablet (2.5 mg total) by mouth daily.   rosuvastatin (CRESTOR) 5 MG tablet Take 1 tablet (5 mg total) by mouth daily. Take 1 tablet by mouth daily for cholesterol   SitaGLIPtin-MetFORMIN HCl (JANUMET XR) [phone] MG TB24 Take 1 tablet by mouth daily at 12 noon.   No facility-administered encounter medications on file as of 03/14/2023.    Surgical History: Past Surgical History:  Procedure Laterality Date   ESOPHAGOGASTRODUODENOSCOPY (EGD) WITH PROPOFOL N/A 02/28/2018   Procedure: ESOPHAGOGASTRODUODENOSCOPY (EGD) WITH PROPOFOL;  Surgeon: Jeani Hawking, MD;  Location: [facility];  Service: Endoscopy;  Laterality: N/A;   ESOPHAGOGASTRODUODENOSCOPY (EGD) WITH PROPOFOL N/A 04/18/2018   Procedure: ESOPHAGOGASTRODUODENOSCOPY (EGD) WITH PROPOFOL;  Surgeon: Jeani Hawking, MD;  Location: [facility];  Service: Endoscopy;  Laterality: N/A;   HOT HEMOSTASIS N/A 02/28/2018   Procedure: HOT HEMOSTASIS (ARGON PLASMA COAGULATION/BICAP);  Surgeon: Jeani Hawking, MD;  Location: [facility];  Service: Endoscopy;  Laterality: N/A;   HOT HEMOSTASIS N/A 04/18/2018   Procedure: HOT HEMOSTASIS (ARGON PLASMA COAGULATION/BICAP);  Surgeon: Jeani Hawking, MD;  Location: [facility];  Service: Endoscopy;  Laterality: N/A;   NASAL SEPTOPLASTY W/ TURBINOPLASTY Bilateral 10/17/2015   Procedure: Nasal Septoplasty, Bilateral Partial Reduction Inferior Turbinates ;  Surgeon: Vernie Murders, MD;  Location: [facility];  Service: ENT;  Laterality: Bilateral;   SHOULDER SURGERY Right    ARTHROSCOPIC   WISDOM TOOTH EXTRACTION      Medical History: Past Medical History:  Diagnosis Date   Anemia    Arthritis     Complication of anesthesia    PT WAS SHEDULED TO HAVE IGSS AT [facility] ON 10-06-15 AND AFTER INDUCTION PT ASPIRATED SCANT AMOUNT OF GASTRIC CONTENTS AND CASE WAS CANCELLED   Deviated septum    Diabetes mellitus (HCC)    on metformin   GERD (gastroesophageal reflux disease)    Hoarseness of voice    SINCE BEING INTUBATED ON 10-06-15   Hypertension    PT DENIES DURING 10-14-15 PHONE INTERVIEW    Nasal congestion    LONG TERM OBSTRUCTION/ENLARGED TURBINATES   Pancreatitis 05/2014   Pancreatitis    PONV (postoperative nausea and vomiting)    Sleep apnea     had surgery no longer uses cpap    Family History: Family History  Problem Relation Age of Onset   Diabetes Mother    Hypertension Mother    Diabetes Father    Cancer Other        paternal aunt    Social History   Socioeconomic History   Marital status: Married    Spouse name: Not on file   Number of children: Not on file   Years of education: Not on file   Highest education level: Not on file  Occupational History   Occupation: Sports administrator: Sports coach  Tobacco Use   Smoking status: Never   Smokeless tobacco: Never  Vaping Use   Vaping Use: Never used  Substance and Sexual Activity   Alcohol use: Yes    Comment: occasional   Drug use: No   Sexual activity: Yes  Other Topics Concern   Not on file  Social History Narrative   Not on file   Social Determinants of Health   Financial Resource Strain: Not on file  Food Insecurity: Not on file  Transportation Needs: Not on file  Physical Activity: Not on file  Stress: Not on file  Social Connections: Not on file  Intimate Partner Violence: Not on file      Review of Systems  Constitutional:  Negative for chills, fatigue and unexpected weight change.  HENT:  Negative for congestion, postnasal drip, rhinorrhea, sneezing and sore throat.   Eyes:  Negative for redness.  Respiratory:  Negative for cough, chest tightness and shortness of  breath.   Cardiovascular:  Negative for chest pain and palpitations.  Gastrointestinal:  Negative for abdominal pain, constipation, diarrhea, nausea and vomiting.  Genitourinary:  Negative for dysuria and frequency.  Musculoskeletal:  Negative for arthralgias, back pain, joint swelling and neck pain.  Skin:  Positive for rash.  Neurological:  Negative for tremors and numbness.  Hematological:  Negative for adenopathy. Does not bruise/bleed easily.  Psychiatric/Behavioral:  Negative for behavioral problems (Depression), sleep disturbance and suicidal ideas. The patient is not nervous/anxious.     Vital Signs: BP 115/75   Pulse 81   Temp 98.3 F (36.8 C)   Resp 16   Ht 5\' 7"  (1.702 m)   Wt 200 lb 12.8 oz (91.1 kg)   SpO2 99%   BMI 31.45 kg/m  Physical Exam Vitals and nursing note reviewed.  Constitutional:      Appearance: Normal appearance. He is obese.  HENT:     Head: Normocephalic and atraumatic.  Eyes:     Pupils: Pupils are equal, round, and reactive to light.  Cardiovascular:     Rate and Rhythm: Normal rate and regular rhythm.     Pulses: Normal pulses.     Heart sounds: Normal heart sounds.  Pulmonary:     Effort: Pulmonary effort is normal.     Breath sounds: Normal breath sounds.  Abdominal:     General: Abdomen is flat. Bowel sounds are normal.     Palpations: Abdomen is soft.  Musculoskeletal:        General: Normal range of motion.     Cervical back: Normal range of motion.  Skin:    General: Skin is warm.     Findings: Rash present.  Neurological:     General: No focal deficit present.     Mental Status: He is alert and oriented to person, place, and time. Mental status is at baseline.  Psychiatric:        Mood and Affect: Mood normal.        Behavior: Behavior normal.        Thought Content: Thought content normal.        Judgment: Judgment normal.        Assessment/Plan: 1. Uncontrolled type 2 diabetes mellitus with hyperglycemia  (HCC) Will continue current medication and request he start checking sugars at home to monitor. Will also continue lisinopril to aid BP as well as elevated microalbumin ratio - dapagliflozin propanediol (FARXIGA) 10 MG TABS tablet; Take 1 tablet (10 mg total) by mouth daily.  Dispense: 90 tablet; Refill: 1 - SitaGLIPtin-MetFORMIN HCl (JANUMET XR) [phone] MG TB24; Take 1 tablet by mouth daily at 12 noon.  Dispense: 90 tablet; Refill: 2 - lisinopril (ZESTRIL) 2.5 MG tablet; Take 1 tablet (2.5 mg total) by mouth daily.  Dispense: 90 tablet; Refill: 1  2. Essential hypertension Well controlled, continue lisinopril  3. Rash Continue topical that is helping and contact office if worsening  4. Mixed hyperlipidemia Continue crestor    General Counseling: Ada verbalizes understanding of the findings of todays visit and agrees with plan of treatment. I have discussed any further diagnostic evaluation that may be needed or ordered today. We also reviewed his medications today. he has been encouraged to call the office with any questions or concerns that should arise related to todays visit.    No orders of the defined types were placed in this encounter.   Meds ordered this encounter  Medications   dapagliflozin propanediol (FARXIGA) 10 MG TABS tablet    Sig: Take 1 tablet (10 mg total) by mouth daily.    Dispense:  90 tablet    Refill:  1   SitaGLIPtin-MetFORMIN HCl (JANUMET XR) [phone] MG TB24    Sig: Take 1 tablet by mouth daily at 12 noon.    Dispense:  90 tablet    Refill:  2   lisinopril (ZESTRIL) 2.5 MG tablet    Sig: Take 1 tablet (2.5 mg total) by mouth daily.    Dispense:  90 tablet    Refill:  1   rosuvastatin (CRESTOR) 5 MG tablet    Sig: Take 1 tablet (5 mg total) by mouth daily. Take 1 tablet by mouth daily for cholesterol    Dispense:  90 tablet    Refill:  1  This patient was seen by Lynn Ito, PA-C in collaboration with Dr. Beverely Risen as a part of  collaborative care agreement.   Total time spent:30 Minutes Time spent includes review of chart, medications, test results, and follow up plan with the patient.      Dr Lyndon Code Internal medicine

## 2023-05-22 ENCOUNTER — Other Ambulatory Visit: Payer: Self-pay | Admitting: Physician Assistant

## 2023-05-22 DIAGNOSIS — K219 Gastro-esophageal reflux disease without esophagitis: Secondary | ICD-10-CM

## 2023-06-11 ENCOUNTER — Telehealth: Payer: Self-pay

## 2023-06-11 NOTE — Telephone Encounter (Signed)
[employer] for patient's Janumet, if not approved a second time, will ask Lauren if Ozempic/Wegovy is an option.

## 2023-06-20 ENCOUNTER — Ambulatory Visit (INDEPENDENT_AMBULATORY_CARE_PROVIDER_SITE_OTHER): Payer: 59 | Admitting: Physician Assistant

## 2023-06-20 ENCOUNTER — Encounter: Payer: Self-pay | Admitting: Physician Assistant

## 2023-06-20 VITALS — BP 130/88 | HR 89 | Temp 97.9°F | Resp 16 | Ht 67.0 in | Wt 199.8 lb

## 2023-06-20 DIAGNOSIS — E1165 Type 2 diabetes mellitus with hyperglycemia: Secondary | ICD-10-CM

## 2023-06-20 DIAGNOSIS — I10 Essential (primary) hypertension: Secondary | ICD-10-CM | POA: Diagnosis not present

## 2023-06-20 LAB — POCT GLYCOSYLATED HEMOGLOBIN (HGB A1C): Hemoglobin A1C: 9.7 % — AB (ref 4.0–5.6)

## 2023-06-20 MED ORDER — JANUMET XR 100-1000 MG PO TB24: 1.0000 | ORAL_TABLET | Freq: Every day | ORAL | 2 refills | Status: DC

## 2023-06-20 MED ORDER — SEMAGLUTIDE(0.25 OR 0.5MG/DOS) 2 MG/3ML ~~LOC~~ SOPN
0.2500 mg | PEN_INJECTOR | SUBCUTANEOUS | 0 refills | Status: DC
Start: 2023-06-20 — End: 2023-08-05

## 2023-06-20 NOTE — Progress Notes (Signed)
[facility] [address] [place], [place] [address]  Internal MEDICINE  Office Visit Note  Patient Name: Ronnie Mejia  [id number]  [id number]  Date of Service: 06/20/2023  Chief Complaint  Patient presents with   Follow-up   Diabetes   Gastroesophageal Reflux   Hypertension   Medication Management    HPI Pt is here for routine follow up -Has been without Janumet for the last month or so, states it finally was approved to be filled 2 days ago but he held off getting it in case we were going to chang meds -still taking [place] and glimepiride -BG has been elevated to 200 -Will try sending ozempic, will send 30day janumet supply to bridge until this med may get approved, though if it goes through today without PA needed then will hold janumet. -Tolerating lisinopril  Current Medication: Outpatient Encounter Medications as of 06/20/2023  Medication Sig   Accu-Chek FastClix Lancets MISC Use as directed twice daily diag E11.65   clomiPHENE (CLOMID) 50 MG tablet Take 1/2 tablet daily.   clomiPHENE (CLOMID) 50 MG tablet Take 1/2 tablet daily   dapagliflozin propanediol (FARXIGA) 10 MG TABS tablet Take 1 tablet (10 mg total) by mouth daily.   EPINEPHrine 0.3 mg/0.3 mL IJ SOAJ injection Inject 0.3 mg into the muscle as needed for anaphylaxis.   glimepiride (AMARYL) 2 MG tablet TAKE 1 TABLET BY MOUTH TWICE A DAY   glucose blood (ACCU-CHEK GUIDE) test strip 1 each by Other route 2 (two) times daily. Use as instructed twice a daily e11.65   lisinopril (ZESTRIL) 2.5 MG tablet Take 1 tablet (2.5 mg total) by mouth daily.   pantoprazole (PROTONIX) 40 MG tablet TAKE 1 TABLET BY MOUTH EVERY DAY   rosuvastatin (CRESTOR) 5 MG tablet Take 1 tablet (5 mg total) by mouth daily. Take 1 tablet by mouth daily for cholesterol   Semaglutide,0.25 or 0.5MG /DOS, 2 MG/3ML SOPN Inject 0.25 mg into the skin once a week.   tadalafil (CIALIS) 20 MG tablet Take 1 tablet (20 mg total) by mouth daily.    [DISCONTINUED] SitaGLIPtin-MetFORMIN HCl (JANUMET XR) [phone] MG TB24 Take 1 tablet by mouth daily at 12 noon.   SitaGLIPtin-MetFORMIN HCl (JANUMET XR) [phone] MG TB24 Take 1 tablet by mouth daily at 12 noon.   No facility-administered encounter medications on file as of 06/20/2023.    Surgical History: Past Surgical History:  Procedure Laterality Date   ESOPHAGOGASTRODUODENOSCOPY (EGD) WITH PROPOFOL N/A 02/28/2018   Procedure: ESOPHAGOGASTRODUODENOSCOPY (EGD) WITH PROPOFOL;  Surgeon: Jeani Hawking, MD;  Location: [facility];  Service: Endoscopy;  Laterality: N/A;   ESOPHAGOGASTRODUODENOSCOPY (EGD) WITH PROPOFOL N/A 04/18/2018   Procedure: ESOPHAGOGASTRODUODENOSCOPY (EGD) WITH PROPOFOL;  Surgeon: Jeani Hawking, MD;  Location: [facility];  Service: Endoscopy;  Laterality: N/A;   HOT HEMOSTASIS N/A 02/28/2018   Procedure: HOT HEMOSTASIS (ARGON PLASMA COAGULATION/BICAP);  Surgeon: Jeani Hawking, MD;  Location: [facility];  Service: Endoscopy;  Laterality: N/A;   HOT HEMOSTASIS N/A 04/18/2018   Procedure: HOT HEMOSTASIS (ARGON PLASMA COAGULATION/BICAP);  Surgeon: Jeani Hawking, MD;  Location: [facility];  Service: Endoscopy;  Laterality: N/A;   NASAL SEPTOPLASTY W/ TURBINOPLASTY Bilateral 10/17/2015   Procedure: Nasal Septoplasty, Bilateral Partial Reduction Inferior Turbinates ;  Surgeon: Vernie Murders, MD;  Location: [facility];  Service: ENT;  Laterality: Bilateral;   SHOULDER SURGERY Right    ARTHROSCOPIC   WISDOM TOOTH EXTRACTION      Medical History: Past Medical History:  Diagnosis Date   Anemia  Arthritis    Complication of anesthesia    PT WAS SHEDULED TO HAVE IGSS AT [facility] ON 10-06-15 AND AFTER INDUCTION PT ASPIRATED SCANT AMOUNT OF GASTRIC CONTENTS AND CASE WAS CANCELLED   Deviated septum    Diabetes mellitus (HCC)    on metformin   GERD (gastroesophageal reflux disease)    Hoarseness of voice    SINCE BEING INTUBATED ON 10-06-15   Hypertension    PT  DENIES DURING 10-14-15 PHONE INTERVIEW    Nasal congestion    LONG TERM OBSTRUCTION/ENLARGED TURBINATES   Pancreatitis 05/2014   Pancreatitis    PONV (postoperative nausea and vomiting)    Sleep apnea     had surgery no longer uses cpap    Family History: Family History  Problem Relation Age of Onset   Diabetes Mother    Hypertension Mother    Diabetes Father    Cancer Other        paternal aunt    Social History   Socioeconomic History   Marital status: Married    Spouse name: Not on file   Number of children: Not on file   Years of education: Not on file   Highest education level: Not on file  Occupational History   Occupation: Sports administrator: Sports coach  Tobacco Use   Smoking status: Never   Smokeless tobacco: Never  Vaping Use   Vaping status: Never Used  Substance and Sexual Activity   Alcohol use: Yes    Comment: occasional   Drug use: No   Sexual activity: Yes  Other Topics Concern   Not on file  Social History Narrative   Not on file   Social Determinants of Health   Financial Resource Strain: Not on file  Food Insecurity: Not on file  Transportation Needs: Not on file  Physical Activity: Not on file  Stress: Not on file  Social Connections: Not on file  Intimate Partner Violence: Not on file      Review of Systems  Constitutional:  Negative for chills, fatigue and unexpected weight change.  HENT:  Negative for congestion, postnasal drip, rhinorrhea, sneezing and sore throat.   Eyes:  Negative for redness.  Respiratory:  Negative for cough, chest tightness and shortness of breath.   Cardiovascular:  Negative for chest pain and palpitations.  Gastrointestinal:  Negative for abdominal pain, constipation, diarrhea, nausea and vomiting.  Genitourinary:  Negative for dysuria and frequency.  Musculoskeletal:  Negative for arthralgias, back pain, joint swelling and neck pain.  Skin:  Negative for rash.  Neurological:  Negative for tremors and  numbness.  Hematological:  Negative for adenopathy. Does not bruise/bleed easily.  Psychiatric/Behavioral:  Negative for behavioral problems (Depression), sleep disturbance and suicidal ideas. The patient is not nervous/anxious.     Vital Signs: BP 130/88 Comment: 120/95  Pulse 89   Temp 97.9 F (36.6 C)   Resp 16   Ht 5\' 7"  (1.702 m)   Wt 199 lb 12.8 oz (90.6 kg)   SpO2 98%   BMI 31.29 kg/m    Physical Exam Vitals and nursing note reviewed.  Constitutional:      Appearance: Normal appearance. He is obese.  HENT:     Head: Normocephalic and atraumatic.  Eyes:     Pupils: Pupils are equal, round, and reactive to light.  Cardiovascular:     Rate and Rhythm: Normal rate and regular rhythm.     Pulses: Normal pulses.  Heart sounds: Normal heart sounds.  Pulmonary:     Effort: Pulmonary effort is normal.     Breath sounds: Normal breath sounds.  Abdominal:     General: Abdomen is flat. Bowel sounds are normal.     Palpations: Abdomen is soft.  Musculoskeletal:        General: Normal range of motion.     Cervical back: Normal range of motion.  Skin:    General: Skin is warm.  Neurological:     General: No focal deficit present.     Mental Status: He is alert and oriented to person, place, and time. Mental status is at baseline.  Psychiatric:        Mood and Affect: Mood normal.        Behavior: Behavior normal.        Thought Content: Thought content normal.        Judgment: Judgment normal.        Assessment/Plan: 1. Uncontrolled type 2 diabetes mellitus with hyperglycemia (HCC) - POCT HgB A1C is 9.7 which is worse from 7.5 last check due to being without janumet. Will try sending ozempic though showing no GLP1 covered and may take time to work on approval, therefore will send 1 month supply janumet in meantime. Discussed this will be held if ozempic started - Semaglutide,0.25 or 0.5MG /DOS, 2 MG/3ML SOPN; Inject 0.25 mg into the skin once a week.  Dispense: 3  mL; Refill: 0 - SitaGLIPtin-MetFORMIN HCl (JANUMET XR) [phone] MG TB24; Take 1 tablet by mouth daily at 12 noon.  Dispense: 30 tablet; Refill: 2  2. Essential hypertension Continue lisinopril for BP and kidney protection as diabetic   General Counseling: Shakai verbalizes understanding of the findings of todays visit and agrees with plan of treatment. I have discussed any further diagnostic evaluation that may be needed or ordered today. We also reviewed his medications today. he has been encouraged to call the office with any questions or concerns that should arise related to todays visit.    Orders Placed This Encounter  Procedures   POCT HgB A1C    Meds ordered this encounter  Medications   Semaglutide,0.25 or 0.5MG /DOS, 2 MG/3ML SOPN    Sig: Inject 0.25 mg into the skin once a week.    Dispense:  3 mL    Refill:  0   SitaGLIPtin-MetFORMIN HCl (JANUMET XR) [phone] MG TB24    Sig: Take 1 tablet by mouth daily at 12 noon.    Dispense:  30 tablet    Refill:  2    This patient was seen by Lynn Ito, PA-C in collaboration with Dr. Beverely Risen as a part of collaborative care agreement.   Total time spent:30 Minutes Time spent includes review of chart, medications, test results, and follow up plan with the patient.      Dr Lyndon Code Internal medicine

## 2023-06-23 ENCOUNTER — Other Ambulatory Visit: Payer: Self-pay | Admitting: Physician Assistant

## 2023-06-23 DIAGNOSIS — E1165 Type 2 diabetes mellitus with hyperglycemia: Secondary | ICD-10-CM

## 2023-08-03 ENCOUNTER — Other Ambulatory Visit: Payer: Self-pay | Admitting: Physician Assistant

## 2023-08-03 DIAGNOSIS — E1165 Type 2 diabetes mellitus with hyperglycemia: Secondary | ICD-10-CM

## 2023-08-19 ENCOUNTER — Encounter: Payer: Self-pay | Admitting: Physician Assistant

## 2023-08-19 ENCOUNTER — Telehealth: Payer: Self-pay | Admitting: Physician Assistant

## 2023-08-19 ENCOUNTER — Ambulatory Visit (INDEPENDENT_AMBULATORY_CARE_PROVIDER_SITE_OTHER): Payer: 59 | Admitting: Physician Assistant

## 2023-08-19 VITALS — BP 130/84 | HR 81 | Temp 97.8°F | Resp 16 | Ht 67.0 in | Wt 199.0 lb

## 2023-08-19 DIAGNOSIS — Z1212 Encounter for screening for malignant neoplasm of rectum: Secondary | ICD-10-CM

## 2023-08-19 DIAGNOSIS — Z0100 Encounter for examination of eyes and vision without abnormal findings: Secondary | ICD-10-CM

## 2023-08-19 DIAGNOSIS — Z1211 Encounter for screening for malignant neoplasm of colon: Secondary | ICD-10-CM | POA: Diagnosis not present

## 2023-08-19 DIAGNOSIS — E1165 Type 2 diabetes mellitus with hyperglycemia: Secondary | ICD-10-CM | POA: Diagnosis not present

## 2023-08-19 DIAGNOSIS — E119 Type 2 diabetes mellitus without complications: Secondary | ICD-10-CM

## 2023-08-19 DIAGNOSIS — I10 Essential (primary) hypertension: Secondary | ICD-10-CM | POA: Diagnosis not present

## 2023-08-19 LAB — POCT CBG (FASTING - GLUCOSE)-MANUAL ENTRY: Glucose Fasting, POC: 198 mg/dL — AB (ref 70–99)

## 2023-08-19 MED ORDER — OZEMPIC (0.25 OR 0.5 MG/DOSE) 2 MG/3ML ~~LOC~~ SOPN: 0.5000 mg | PEN_INJECTOR | SUBCUTANEOUS | 3 refills | Status: DC

## 2023-08-19 NOTE — Progress Notes (Signed)
[facility] [address] [place], [place] [address]  Internal MEDICINE  Office Visit Note  Patient Name: Ronnie Mejia  [id number]  [id number]  Date of Service: 08/22/2023  Chief Complaint  Patient presents with   Follow-up    Referral for opthalmology    Diabetes    HPI Pt is here for routine follow up -tolerating ozempic weekly, wants to increase dose -due for colon screening, declines colonoscopy, will do cologuard. States he had colonoscopy with upper GI in 2019 due to GI bleed, but results not available for colon portion. Several EGD studies in chart, but no colonoscopy. Will go ahead with cologuard since unclear if and when colonoscopy done and when follow up is indicated -needs referral for eye exam  Current Medication: Outpatient Encounter Medications as of 08/19/2023  Medication Sig   Accu-Chek FastClix Lancets MISC Use as directed twice daily diag E11.65   clomiPHENE (CLOMID) 50 MG tablet Take 1/2 tablet daily.   clomiPHENE (CLOMID) 50 MG tablet Take 1/2 tablet daily   dapagliflozin propanediol (FARXIGA) 10 MG TABS tablet Take 1 tablet (10 mg total) by mouth daily.   EPINEPHrine 0.3 mg/0.3 mL IJ SOAJ injection Inject 0.3 mg into the muscle as needed for anaphylaxis.   glimepiride (AMARYL) 2 MG tablet TAKE 1 TABLET BY MOUTH TWICE A DAY   glucose blood (ACCU-CHEK GUIDE) test strip 1 each by Other route 2 (two) times daily. Use as instructed twice a daily e11.65   lisinopril (ZESTRIL) 2.5 MG tablet TAKE 1 TABLET BY MOUTH EVERY DAY   pantoprazole (PROTONIX) 40 MG tablet TAKE 1 TABLET BY MOUTH EVERY DAY   rosuvastatin (CRESTOR) 5 MG tablet Take 1 tablet (5 mg total) by mouth daily. Take 1 tablet by mouth daily for cholesterol   tadalafil (CIALIS) 20 MG tablet Take 1 tablet (20 mg total) by mouth daily.   [DISCONTINUED] OZEMPIC, 0.25 OR 0.5 MG/DOSE, 2 MG/3ML SOPN INJECT 0.25MG  INTO THE SKIN ONE TIME PER WEEK   Semaglutide,0.25 or 0.5MG /DOS, (OZEMPIC, 0.25 OR  0.5 MG/DOSE,) 2 MG/3ML SOPN Inject 0.5 mg into the skin once a week.   [DISCONTINUED] SitaGLIPtin-MetFORMIN HCl (JANUMET XR) [phone] MG TB24 Take 1 tablet by mouth daily at 12 noon. (Patient not taking: Reported on 08/19/2023)   No facility-administered encounter medications on file as of 08/19/2023.    Surgical History: Past Surgical History:  Procedure Laterality Date   ESOPHAGOGASTRODUODENOSCOPY (EGD) WITH PROPOFOL N/A 02/28/2018   Procedure: ESOPHAGOGASTRODUODENOSCOPY (EGD) WITH PROPOFOL;  Surgeon: Jeani Hawking, MD;  Location: [facility];  Service: Endoscopy;  Laterality: N/A;   ESOPHAGOGASTRODUODENOSCOPY (EGD) WITH PROPOFOL N/A 04/18/2018   Procedure: ESOPHAGOGASTRODUODENOSCOPY (EGD) WITH PROPOFOL;  Surgeon: Jeani Hawking, MD;  Location: [facility];  Service: Endoscopy;  Laterality: N/A;   HOT HEMOSTASIS N/A 02/28/2018   Procedure: HOT HEMOSTASIS (ARGON PLASMA COAGULATION/BICAP);  Surgeon: Jeani Hawking, MD;  Location: [facility];  Service: Endoscopy;  Laterality: N/A;   HOT HEMOSTASIS N/A 04/18/2018   Procedure: HOT HEMOSTASIS (ARGON PLASMA COAGULATION/BICAP);  Surgeon: Jeani Hawking, MD;  Location: [facility];  Service: Endoscopy;  Laterality: N/A;   NASAL SEPTOPLASTY W/ TURBINOPLASTY Bilateral 10/17/2015   Procedure: Nasal Septoplasty, Bilateral Partial Reduction Inferior Turbinates ;  Surgeon: Vernie Murders, MD;  Location: [facility];  Service: ENT;  Laterality: Bilateral;   SHOULDER SURGERY Right    ARTHROSCOPIC   WISDOM TOOTH EXTRACTION      Medical History: Past Medical History:  Diagnosis Date   Anemia    Arthritis  Complication of anesthesia    PT WAS SHEDULED TO HAVE IGSS AT [facility] ON 10-06-15 AND AFTER INDUCTION PT ASPIRATED SCANT AMOUNT OF GASTRIC CONTENTS AND CASE WAS CANCELLED   Deviated septum    Diabetes mellitus (HCC)    on metformin   GERD (gastroesophageal reflux disease)    Hoarseness of voice    SINCE BEING INTUBATED ON 10-06-15    Hypertension    PT DENIES DURING 10-14-15 PHONE INTERVIEW    Nasal congestion    LONG TERM OBSTRUCTION/ENLARGED TURBINATES   Pancreatitis 05/2014   Pancreatitis    PONV (postoperative nausea and vomiting)    Sleep apnea     had surgery no longer uses cpap    Family History: Family History  Problem Relation Age of Onset   Diabetes Mother    Hypertension Mother    Diabetes Father    Cancer Other        paternal aunt    Social History   Socioeconomic History   Marital status: Married    Spouse name: Not on file   Number of children: Not on file   Years of education: Not on file   Highest education level: Not on file  Occupational History   Occupation: Sports administrator: Sports coach  Tobacco Use   Smoking status: Never   Smokeless tobacco: Never  Vaping Use   Vaping status: Never Used  Substance and Sexual Activity   Alcohol use: Yes    Comment: occasional   Drug use: No   Sexual activity: Yes  Other Topics Concern   Not on file  Social History Narrative   Not on file   Social Determinants of Health   Financial Resource Strain: Not on file  Food Insecurity: Not on file  Transportation Needs: Not on file  Physical Activity: Not on file  Stress: Not on file  Social Connections: Not on file  Intimate Partner Violence: Not on file      Review of Systems  Constitutional:  Negative for chills, fatigue and unexpected weight change.  HENT:  Negative for congestion, postnasal drip, rhinorrhea, sneezing and sore throat.   Eyes:  Negative for redness.  Respiratory:  Negative for cough, chest tightness and shortness of breath.   Cardiovascular:  Negative for chest pain and palpitations.  Gastrointestinal:  Negative for abdominal pain, constipation, diarrhea, nausea and vomiting.  Genitourinary:  Negative for dysuria and frequency.  Musculoskeletal:  Negative for arthralgias, back pain, joint swelling and neck pain.  Skin:  Negative for rash.  Neurological:   Negative for tremors and numbness.  Hematological:  Negative for adenopathy. Does not bruise/bleed easily.  Psychiatric/Behavioral:  Negative for behavioral problems (Depression), sleep disturbance and suicidal ideas. The patient is not nervous/anxious.     Vital Signs: BP 130/84   Pulse 81   Temp 97.8 F (36.6 C)   Resp 16   Ht 5\' 7"  (1.702 m)   Wt 199 lb (90.3 kg)   SpO2 97%   BMI 31.17 kg/m    Physical Exam Vitals and nursing note reviewed.  Constitutional:      Appearance: Normal appearance. He is obese.  HENT:     Head: Normocephalic and atraumatic.  Eyes:     Pupils: Pupils are equal, round, and reactive to light.  Cardiovascular:     Rate and Rhythm: Normal rate and regular rhythm.     Pulses: Normal pulses.     Heart sounds: Normal heart sounds.  Pulmonary:  Effort: Pulmonary effort is normal.     Breath sounds: Normal breath sounds.  Abdominal:     General: Abdomen is flat. Bowel sounds are normal.     Palpations: Abdomen is soft.  Musculoskeletal:        General: Normal range of motion.     Cervical back: Normal range of motion.  Skin:    General: Skin is warm.  Neurological:     General: No focal deficit present.     Mental Status: He is alert and oriented to person, place, and time. Mental status is at baseline.  Psychiatric:        Mood and Affect: Mood normal.        Behavior: Behavior normal.        Thought Content: Thought content normal.        Judgment: Judgment normal.        Assessment/Plan: 1. Type 2 diabetes mellitus with hyperglycemia, without long-term current use of insulin (HCC) Will increase ozempic and continue to monitor - POCT CBG (Fasting - Glucose) - Semaglutide,0.25 or 0.5MG /DOS, (OZEMPIC, 0.25 OR 0.5 MG/DOSE,) 2 MG/3ML SOPN; Inject 0.5 mg into the skin once a week.  Dispense: 3 mL; Refill: 3  2. Essential hypertension Stable, continue current medication  3. Diabetic eye exam [facility]) - Ambulatory referral to  Ophthalmology  4. Screening for colorectal cancer - Cologuard   General Counseling: Ronnie Mejia verbalizes understanding of the findings of todays visit and agrees with plan of treatment. I have discussed any further diagnostic evaluation that may be needed or ordered today. We also reviewed his medications today. he has been encouraged to call the office with any questions or concerns that should arise related to todays visit.    Orders Placed This Encounter  Procedures   Cologuard   Ambulatory referral to Ophthalmology   POCT CBG (Fasting - Glucose)    Meds ordered this encounter  Medications   Semaglutide,0.25 or 0.5MG /DOS, (OZEMPIC, 0.25 OR 0.5 MG/DOSE,) 2 MG/3ML SOPN    Sig: Inject 0.5 mg into the skin once a week.    Dispense:  3 mL    Refill:  3    This patient was seen by Lynn Ito, PA-C in collaboration with Dr. Beverely Risen as a part of collaborative care agreement.   Total time spent:30 Minutes Time spent includes review of chart, medications, test results, and follow up plan with the patient.      Dr Lyndon Code Internal medicine

## 2023-08-19 NOTE — Telephone Encounter (Signed)
Awaiting 08/19/23 office notes for Ophthalmology referral-[facility]oni

## 2023-08-27 ENCOUNTER — Telehealth: Payer: Self-pay | Admitting: Physician Assistant

## 2023-08-27 NOTE — Telephone Encounter (Signed)
Ophthalmology referral sent via [facility] to [facility]. Notified patient. Gave pt telephone # [phone]

## 2023-10-04 ENCOUNTER — Ambulatory Visit: Payer: 59 | Admitting: Physician Assistant

## 2023-10-13 ENCOUNTER — Other Ambulatory Visit: Payer: Self-pay | Admitting: Physician Assistant

## 2023-10-14 ENCOUNTER — Telehealth: Payer: Self-pay | Admitting: Physician Assistant

## 2023-10-14 NOTE — Telephone Encounter (Signed)
Ophthalmology appointment 11/19/2023 @ [facility]

## 2023-10-17 ENCOUNTER — Ambulatory Visit: Payer: 59 | Admitting: Physician Assistant

## 2023-11-14 ENCOUNTER — Ambulatory Visit (INDEPENDENT_AMBULATORY_CARE_PROVIDER_SITE_OTHER): Payer: 59 | Admitting: Physician Assistant

## 2023-11-14 ENCOUNTER — Encounter: Payer: Self-pay | Admitting: Oncology

## 2023-11-14 ENCOUNTER — Encounter: Payer: Self-pay | Admitting: Physician Assistant

## 2023-11-14 VITALS — BP 115/80 | HR 91 | Temp 98.0°F | Resp 16 | Ht 67.0 in | Wt 197.0 lb

## 2023-11-14 DIAGNOSIS — E782 Mixed hyperlipidemia: Secondary | ICD-10-CM

## 2023-11-14 DIAGNOSIS — K219 Gastro-esophageal reflux disease without esophagitis: Secondary | ICD-10-CM

## 2023-11-14 DIAGNOSIS — I10 Essential (primary) hypertension: Secondary | ICD-10-CM

## 2023-11-14 DIAGNOSIS — E1165 Type 2 diabetes mellitus with hyperglycemia: Secondary | ICD-10-CM | POA: Diagnosis not present

## 2023-11-14 DIAGNOSIS — Z0001 Encounter for general adult medical examination with abnormal findings: Secondary | ICD-10-CM

## 2023-11-14 LAB — POCT GLYCOSYLATED HEMOGLOBIN (HGB A1C): Hemoglobin A1C: 8.4 % — AB (ref 4.0–5.6)

## 2023-11-14 MED ORDER — LISINOPRIL 2.5 MG PO TABS: 2.5000 mg | ORAL_TABLET | Freq: Every day | ORAL | 1 refills | Status: DC

## 2023-11-14 MED ORDER — OZEMPIC (0.25 OR 0.5 MG/DOSE) 2 MG/3ML ~~LOC~~ SOPN: 0.5000 mg | PEN_INJECTOR | SUBCUTANEOUS | 3 refills | Status: DC

## 2023-11-14 MED ORDER — DAPAGLIFLOZIN PROPANEDIOL 10 MG PO TABS: 10.0000 mg | ORAL_TABLET | Freq: Every day | ORAL | 1 refills | Status: DC

## 2023-11-14 MED ORDER — PANTOPRAZOLE SODIUM 40 MG PO TBEC: 40.0000 mg | DELAYED_RELEASE_TABLET | Freq: Every day | ORAL | 1 refills | Status: DC

## 2023-11-14 MED ORDER — GLIMEPIRIDE 2 MG PO TABS: 2.0000 mg | ORAL_TABLET | Freq: Two times a day (BID) | ORAL | 1 refills | Status: DC

## 2023-11-14 MED ORDER — ACCU-CHEK GUIDE ME W/DEVICE KIT: PACK | 0 refills | Status: AC

## 2023-11-14 NOTE — Progress Notes (Signed)
#  [phone]

## 2023-11-14 NOTE — Progress Notes (Signed)
[facility] [address] [place], [place] [address]  Internal MEDICINE  Office Visit Note  Patient Name: Ronnie Mejia  [id number]  [id number]  Date of Service: 11/14/2023  Chief Complaint  Patient presents with   Annual Exam   Diabetes   Gastroesophageal Reflux   Hypertension     HPI Pt is here for routine health maintenance examination -tolerating ozempic well, would like to stick with current dose since A1c dropping. If no further improvement next time then will be open to increasing dose -having a battle with county over business right now. So this has been stressful and upsetting. States there are moments where he feels like he could cry, but that he is managing and knows this will pass and work out eventually. Declines any medication at this time, but will call if any changes -insurance recently changed, held off on cologuard due to change in insurance at the time and will try this now -has an appt for eye exam -foot exam done, dry, fungal -Lab slip given  Current Medication: Outpatient Encounter Medications as of 11/14/2023  Medication Sig   Accu-Chek FastClix Lancets MISC Use as directed twice daily diag E11.65   Blood Glucose Monitoring Suppl (ACCU-CHEK GUIDE ME) w/Device KIT Use as directed. E11.65   EPINEPHrine 0.3 mg/0.3 mL IJ SOAJ injection Inject 0.3 mg into the muscle as needed for anaphylaxis.   glucose blood (ACCU-CHEK GUIDE) test strip 1 each by Other route 2 (two) times daily. Use as instructed twice a daily e11.65   rosuvastatin (CRESTOR) 5 MG tablet TAKE 1 TABLET (5 MG TOTAL) BY MOUTH DAILY. TAKE 1 TABLET BY MOUTH DAILY FOR CHOLESTEROL   tadalafil (CIALIS) 20 MG tablet Take 1 tablet (20 mg total) by mouth daily.   [DISCONTINUED] clomiPHENE (CLOMID) 50 MG tablet Take 1/2 tablet daily.   [DISCONTINUED] clomiPHENE (CLOMID) 50 MG tablet Take 1/2 tablet daily   [DISCONTINUED] dapagliflozin propanediol (FARXIGA) 10 MG TABS tablet Take 1 tablet (10  mg total) by mouth daily.   [DISCONTINUED] glimepiride (AMARYL) 2 MG tablet TAKE 1 TABLET BY MOUTH TWICE A DAY   [DISCONTINUED] lisinopril (ZESTRIL) 2.5 MG tablet TAKE 1 TABLET BY MOUTH EVERY DAY   [DISCONTINUED] pantoprazole (PROTONIX) 40 MG tablet TAKE 1 TABLET BY MOUTH EVERY DAY   [DISCONTINUED] Semaglutide,0.25 or 0.5MG /DOS, (OZEMPIC, 0.25 OR 0.5 MG/DOSE,) 2 MG/3ML SOPN Inject 0.5 mg into the skin once a week.   dapagliflozin propanediol (FARXIGA) 10 MG TABS tablet Take 1 tablet (10 mg total) by mouth daily.   glimepiride (AMARYL) 2 MG tablet Take 1 tablet (2 mg total) by mouth 2 (two) times daily.   lisinopril (ZESTRIL) 2.5 MG tablet Take 1 tablet (2.5 mg total) by mouth daily.   pantoprazole (PROTONIX) 40 MG tablet Take 1 tablet (40 mg total) by mouth daily.   Semaglutide,0.25 or 0.5MG /DOS, (OZEMPIC, 0.25 OR 0.5 MG/DOSE,) 2 MG/3ML SOPN Inject 0.5 mg into the skin once a week.   No facility-administered encounter medications on file as of 11/14/2023.    Surgical History: Past Surgical History:  Procedure Laterality Date   ESOPHAGOGASTRODUODENOSCOPY (EGD) WITH PROPOFOL N/A 02/28/2018   Procedure: ESOPHAGOGASTRODUODENOSCOPY (EGD) WITH PROPOFOL;  Surgeon: Jeani Hawking, MD;  Location: [facility];  Service: Endoscopy;  Laterality: N/A;   ESOPHAGOGASTRODUODENOSCOPY (EGD) WITH PROPOFOL N/A 04/18/2018   Procedure: ESOPHAGOGASTRODUODENOSCOPY (EGD) WITH PROPOFOL;  Surgeon: Jeani Hawking, MD;  Location: [facility];  Service: Endoscopy;  Laterality: N/A;   HOT HEMOSTASIS N/A 02/28/2018   Procedure: HOT HEMOSTASIS (  ARGON PLASMA COAGULATION/BICAP);  Surgeon: Jeani Hawking, MD;  Location: [facility];  Service: Endoscopy;  Laterality: N/A;   HOT HEMOSTASIS N/A 04/18/2018   Procedure: HOT HEMOSTASIS (ARGON PLASMA COAGULATION/BICAP);  Surgeon: Jeani Hawking, MD;  Location: [facility];  Service: Endoscopy;  Laterality: N/A;   NASAL SEPTOPLASTY W/ TURBINOPLASTY Bilateral 10/17/2015   Procedure: Nasal  Septoplasty, Bilateral Partial Reduction Inferior Turbinates ;  Surgeon: Vernie Murders, MD;  Location: [facility];  Service: ENT;  Laterality: Bilateral;   SHOULDER SURGERY Right    ARTHROSCOPIC   WISDOM TOOTH EXTRACTION      Medical History: Past Medical History:  Diagnosis Date   Anemia    Arthritis    Complication of anesthesia    PT WAS SHEDULED TO HAVE IGSS AT [facility] ON 10-06-15 AND AFTER INDUCTION PT ASPIRATED SCANT AMOUNT OF GASTRIC CONTENTS AND CASE WAS CANCELLED   Deviated septum    Diabetes mellitus (HCC)    on metformin   GERD (gastroesophageal reflux disease)    Hoarseness of voice    SINCE BEING INTUBATED ON 10-06-15   Hypertension    PT DENIES DURING 10-14-15 PHONE INTERVIEW    Nasal congestion    LONG TERM OBSTRUCTION/ENLARGED TURBINATES   Pancreatitis 05/2014   Pancreatitis    PONV (postoperative nausea and vomiting)    Sleep apnea     had surgery no longer uses cpap    Family History: Family History  Problem Relation Age of Onset   Diabetes Mother    Hypertension Mother    Diabetes Father    Cancer Other        paternal aunt      Review of Systems  Constitutional:  Negative for chills, fatigue and unexpected weight change.  HENT:  Negative for congestion, postnasal drip, rhinorrhea, sneezing and sore throat.   Eyes:  Negative for redness.  Respiratory:  Negative for cough, chest tightness and shortness of breath.   Cardiovascular:  Negative for chest pain and palpitations.  Gastrointestinal:  Negative for abdominal pain, constipation, diarrhea, nausea and vomiting.  Genitourinary:  Negative for dysuria and frequency.  Musculoskeletal:  Negative for arthralgias, back pain, joint swelling and neck pain.  Skin:  Negative for rash.  Neurological:  Negative for tremors.  Hematological:  Negative for adenopathy. Does not bruise/bleed easily.  Psychiatric/Behavioral:  Positive for dysphoric mood. Negative for suicidal ideas. Behavioral problem:  Depression.The patient is nervous/anxious.      Vital Signs: BP 115/80   Pulse 91   Temp 98 F (36.7 C)   Resp 16   Ht 5\' 7"  (1.702 m)   Wt 197 lb (89.4 kg)   SpO2 96%   BMI 30.85 kg/m    Physical Exam Vitals and nursing note reviewed.  Constitutional:      Appearance: Normal appearance. He is obese.  HENT:     Head: Normocephalic and atraumatic.  Eyes:     Pupils: Pupils are equal, round, and reactive to light.  Cardiovascular:     Rate and Rhythm: Normal rate and regular rhythm.     Pulses: Normal pulses.     Heart sounds: Normal heart sounds.  Pulmonary:     Effort: Pulmonary effort is normal.     Breath sounds: Normal breath sounds.  Abdominal:     General: Abdomen is flat. Bowel sounds are normal.     Palpations: Abdomen is soft.     Tenderness: There is no abdominal tenderness.  Musculoskeletal:  General: Normal range of motion.     Cervical back: Normal range of motion.  Feet:     Right foot:     Protective Sensation: 3 sites tested.  2 sites sensed.     Skin integrity: Callus and dry skin present. No ulcer.     Toenail Condition: Fungal disease present.    Left foot:     Protective Sensation: 3 sites tested.  2 sites sensed.     Skin integrity: Callus and dry skin present. No ulcer.     Toenail Condition: Fungal disease present. Skin:    General: Skin is warm.  Neurological:     General: No focal deficit present.     Mental Status: He is alert and oriented to person, place, and time. Mental status is at baseline.  Psychiatric:        Mood and Affect: Mood normal.        Behavior: Behavior normal.        Thought Content: Thought content normal.        Judgment: Judgment normal.      LABS: Recent Results (from the past 2160 hours)  POCT CBG (Fasting - Glucose)     Status: Abnormal   Collection Time: 08/19/23  8:23 AM  Result Value Ref Range   Glucose Fasting, POC 198 (A) 70 - 99 mg/dL  POCT HgB Z6X     Status: Abnormal   Collection  Time: 11/14/23  9:02 AM  Result Value Ref Range   Hemoglobin A1C 8.4 (A) 4.0 - 5.6 %   HbA1c POC (<> result, manual entry)     HbA1c, POC (prediabetic range)     HbA1c, POC (controlled diabetic range)          Assessment/Plan: 1. Encounter for general adult medical examination with abnormal findings (Primary) CPE performed, lab slip given, will have eye exam soon and will have cologuard done  2. Type 2 diabetes mellitus with hyperglycemia, without long-term current use of insulin (HCC) - POCT HgB A1C is 8.4 which is improved from 9.7 last visit. Will continue with current medications, if not continuing to drop then will try increased dose of ozempic - Urine Microalbumin w/creat. ratio - Semaglutide,0.25 or 0.5MG /DOS, (OZEMPIC, 0.25 OR 0.5 MG/DOSE,) 2 MG/3ML SOPN; Inject 0.5 mg into the skin once a week.  Dispense: 3 mL; Refill: 3 - glimepiride (AMARYL) 2 MG tablet; Take 1 tablet (2 mg total) by mouth 2 (two) times daily.  Dispense: 180 tablet; Refill: 1 - Blood Glucose Monitoring Suppl (ACCU-CHEK GUIDE ME) w/Device KIT; Use as directed. E11.65  Dispense: 1 kit; Refill: 0 - lisinopril (ZESTRIL) 2.5 MG tablet; Take 1 tablet (2.5 mg total) by mouth daily.  Dispense: 90 tablet; Refill: 1 - dapagliflozin propanediol (FARXIGA) 10 MG TABS tablet; Take 1 tablet (10 mg total) by mouth daily.  Dispense: 90 tablet; Refill: 1  3. Essential hypertension Controlled, continue lisinopril as before  4. Gastroesophageal reflux disease without esophagitis - pantoprazole (PROTONIX) 40 MG tablet; Take 1 tablet (40 mg total) by mouth daily.  Dispense: 90 tablet; Refill: 1  5. Mixed hyperlipidemia Continue crestor and working on diet and exercise, will check labs   General Counseling: Mena verbalizes understanding of the findings of todays visit and agrees with plan of treatment. I have discussed any further diagnostic evaluation that may be needed or ordered today. We also reviewed his medications  today. he has been encouraged to call the office with any questions or concerns that should  arise related to todays visit.    Counseling:    Orders Placed This Encounter  Procedures   Urine Microalbumin w/creat. ratio   POCT HgB A1C    Meds ordered this encounter  Medications   Semaglutide,0.25 or 0.5MG /DOS, (OZEMPIC, 0.25 OR 0.5 MG/DOSE,) 2 MG/3ML SOPN    Sig: Inject 0.5 mg into the skin once a week.    Dispense:  3 mL    Refill:  3   pantoprazole (PROTONIX) 40 MG tablet    Sig: Take 1 tablet (40 mg total) by mouth daily.    Dispense:  90 tablet    Refill:  1   lisinopril (ZESTRIL) 2.5 MG tablet    Sig: Take 1 tablet (2.5 mg total) by mouth daily.    Dispense:  90 tablet    Refill:  1   glimepiride (AMARYL) 2 MG tablet    Sig: Take 1 tablet (2 mg total) by mouth 2 (two) times daily.    Dispense:  180 tablet    Refill:  1   dapagliflozin propanediol (FARXIGA) 10 MG TABS tablet    Sig: Take 1 tablet (10 mg total) by mouth daily.    Dispense:  90 tablet    Refill:  1   Blood Glucose Monitoring Suppl (ACCU-CHEK GUIDE ME) w/Device KIT    Sig: Use as directed. E11.65    Dispense:  1 kit    Refill:  0    This patient was seen by Lynn Ito, PA-C in collaboration with Dr. Beverely Risen as a part of collaborative care agreement.  Total time spent:35 Minutes  Time spent includes review of chart, medications, test results, and follow up plan with the patient.     Lyndon Code, MD  Internal Medicine

## 2023-11-15 LAB — MICROALBUMIN / CREATININE URINE RATIO
Creatinine, Urine: 90 mg/dL
Microalb/Creat Ratio: 68 mg/g{creat} — ABNORMAL HIGH (ref 0–29)
Microalbumin, Urine: 61.1 ug/mL

## 2023-12-06 ENCOUNTER — Other Ambulatory Visit: Payer: Self-pay | Admitting: Physician Assistant

## 2023-12-06 DIAGNOSIS — E1165 Type 2 diabetes mellitus with hyperglycemia: Secondary | ICD-10-CM

## 2023-12-08 ENCOUNTER — Other Ambulatory Visit: Payer: Self-pay | Admitting: Physician Assistant

## 2023-12-08 DIAGNOSIS — E1165 Type 2 diabetes mellitus with hyperglycemia: Secondary | ICD-10-CM

## 2023-12-09 ENCOUNTER — Telehealth: Payer: Self-pay

## 2023-12-09 ENCOUNTER — Other Ambulatory Visit: Payer: Self-pay | Admitting: Physician Assistant

## 2023-12-09 DIAGNOSIS — E1165 Type 2 diabetes mellitus with hyperglycemia: Secondary | ICD-10-CM

## 2023-12-09 MED ORDER — TIRZEPATIDE 2.5 MG/0.5ML ~~LOC~~ SOAJ: 2.5000 mg | SUBCUTANEOUS | 0 refills | Status: DC

## 2023-12-10 ENCOUNTER — Telehealth: Payer: Self-pay

## 2023-12-10 NOTE — Telephone Encounter (Signed)
Pt was notified that we send alternative

## 2023-12-10 NOTE — Telephone Encounter (Signed)
Completed P.A. for patient's Farxiga.

## 2023-12-10 NOTE — Telephone Encounter (Signed)
Completed P.A. for patient's Mounjaro.

## 2024-01-02 ENCOUNTER — Telehealth: Payer: Self-pay

## 2024-01-02 NOTE — Telephone Encounter (Signed)
 Completed P.A. for patient's Mounjaro.

## 2024-01-03 ENCOUNTER — Telehealth: Payer: Self-pay

## 2024-01-03 NOTE — Telephone Encounter (Signed)
 Faxed P.A. information to [facility] for patient's Mounjaro.

## 2024-01-14 ENCOUNTER — Telehealth: Payer: Self-pay

## 2024-01-15 ENCOUNTER — Other Ambulatory Visit: Payer: Self-pay | Admitting: Physician Assistant

## 2024-01-15 DIAGNOSIS — E1165 Type 2 diabetes mellitus with hyperglycemia: Secondary | ICD-10-CM

## 2024-01-15 MED ORDER — RYBELSUS 7 MG PO TABS: 7.0000 mg | ORAL_TABLET | Freq: Every day | ORAL | 2 refills | Status: DC

## 2024-01-16 NOTE — Telephone Encounter (Signed)
 Patient notified

## 2024-01-17 ENCOUNTER — Telehealth: Payer: Self-pay

## 2024-01-17 NOTE — Telephone Encounter (Signed)
 Patient was notified that Rybelsus PA  was approved.

## 2024-03-01 ENCOUNTER — Other Ambulatory Visit: Payer: Self-pay | Admitting: Physician Assistant

## 2024-03-12 ENCOUNTER — Ambulatory Visit (INDEPENDENT_AMBULATORY_CARE_PROVIDER_SITE_OTHER): Payer: 59 | Admitting: Physician Assistant

## 2024-03-12 ENCOUNTER — Encounter: Payer: Self-pay | Admitting: Physician Assistant

## 2024-03-12 VITALS — BP 122/88 | HR 78 | Temp 97.8°F | Resp 16 | Ht 67.0 in | Wt 196.0 lb

## 2024-03-12 DIAGNOSIS — I10 Essential (primary) hypertension: Secondary | ICD-10-CM

## 2024-03-12 DIAGNOSIS — E1165 Type 2 diabetes mellitus with hyperglycemia: Secondary | ICD-10-CM

## 2024-03-12 LAB — POCT GLYCOSYLATED HEMOGLOBIN (HGB A1C): Hemoglobin A1C: 11.2 % — AB (ref 4.0–5.6)

## 2024-03-12 MED ORDER — TRESIBA FLEXTOUCH 100 UNIT/ML ~~LOC~~ SOPN: 10.0000 [IU] | PEN_INJECTOR | Freq: Every day | SUBCUTANEOUS | 2 refills | Status: DC

## 2024-03-12 MED ORDER — METFORMIN HCL ER 500 MG PO TB24: 500.0000 mg | ORAL_TABLET | Freq: Every day | ORAL | 2 refills | Status: DC

## 2024-03-12 NOTE — Progress Notes (Signed)
 [facility] [address] [place], [place] [address]  Internal MEDICINE  Office Visit Note  Patient Name: Ronnie Mejia  [id number]  [id number]  Date of Service: 03/12/2024  Chief Complaint  Patient presents with   Follow-up   Diabetes   Gastroesophageal Reflux    HPI Pt is here for routine follow up -Pt is taking rybelsus  and glimepiride . No vomiting, diarrhea or constipation, but sometimes just doesn't feel great and wonders if it is the rybelsus  or his BP med.  -had stopped bp med for a week and may have improved, but has since gone back on and symptoms again. Advised to start monitoring BP at home and may need to stop in future. -not checking BG at home either, but knows it has been high and this could also contribute to not feeling well at times. He did well on ozempic  until insurance would not cover. Not sure that rybelsus  works as well as the injection did. -farxiga  also not affordable -He is open to retrying metformin  as he does not recall having a problem tolerating. He was on Janumet  which would avoid due to sitagliptan with GLP1, but may restart metformin  alone. Given A1c thoguh will also start insulin  daily to help get better control. Discussed importance of monitoring BG to avoid lows as well.  Current Medication: Outpatient Encounter Medications as of 03/12/2024  Medication Sig   Accu-Chek FastClix Lancets MISC Use as directed twice daily diag E11.65   Blood Glucose Monitoring Suppl (ACCU-CHEK GUIDE ME) w/Device KIT Use as directed. E11.65   EPINEPHrine  0.3 mg/0.3 mL IJ SOAJ injection Inject 0.3 mg into the muscle as needed for anaphylaxis.   glimepiride  (AMARYL ) 2 MG tablet Take 1 tablet (2 mg total) by mouth 2 (two) times daily.   glucose blood (ACCU-CHEK GUIDE) test strip 1 each by Other route 2 (two) times daily. Use as instructed twice a daily e11.65   insulin  degludec (TRESIBA FLEXTOUCH) 100 UNIT/ML FlexTouch Pen Inject 10 Units into the skin daily.    lisinopril  (ZESTRIL ) 2.5 MG tablet Take 1 tablet (2.5 mg total) by mouth daily.   metFORMIN  (GLUCOPHAGE -XR) 500 MG 24 hr tablet Take 1 tablet (500 mg total) by mouth daily with breakfast.   pantoprazole  (PROTONIX ) 40 MG tablet Take 1 tablet (40 mg total) by mouth daily.   rosuvastatin  (CRESTOR ) 5 MG tablet TAKE 1 TABLET (5 MG TOTAL) BY MOUTH DAILY. TAKE 1 TABLET BY MOUTH DAILY FOR CHOLESTEROL   Semaglutide  (RYBELSUS ) 7 MG TABS Take 1 tablet (7 mg total) by mouth daily.   tadalafil  (CIALIS ) 20 MG tablet Take 1 tablet (20 mg total) by mouth daily.   dapagliflozin  propanediol (FARXIGA ) 10 MG TABS tablet Take 1 tablet (10 mg total) by mouth daily. (Patient not taking: Reported on 03/12/2024)   No facility-administered encounter medications on file as of 03/12/2024.    Surgical History: Past Surgical History:  Procedure Laterality Date   ESOPHAGOGASTRODUODENOSCOPY (EGD) WITH PROPOFOL  N/A 02/28/2018   Procedure: ESOPHAGOGASTRODUODENOSCOPY (EGD) WITH PROPOFOL ;  Surgeon: Alvis Jourdain, MD;  Location: [facility];  Service: Endoscopy;  Laterality: N/A;   ESOPHAGOGASTRODUODENOSCOPY (EGD) WITH PROPOFOL  N/A 04/18/2018   Procedure: ESOPHAGOGASTRODUODENOSCOPY (EGD) WITH PROPOFOL ;  Surgeon: Alvis Jourdain, MD;  Location: [facility];  Service: Endoscopy;  Laterality: N/A;   HOT HEMOSTASIS N/A 02/28/2018   Procedure: HOT HEMOSTASIS (ARGON PLASMA COAGULATION/BICAP);  Surgeon: Alvis Jourdain, MD;  Location: [facility];  Service: Endoscopy;  Laterality: N/A;   HOT HEMOSTASIS N/A 04/18/2018   Procedure: HOT  HEMOSTASIS (ARGON PLASMA COAGULATION/BICAP);  Surgeon: Alvis Jourdain, MD;  Location: [facility];  Service: Endoscopy;  Laterality: N/A;   NASAL SEPTOPLASTY W/ TURBINOPLASTY Bilateral 10/17/2015   Procedure: Nasal Septoplasty, Bilateral Partial Reduction Inferior Turbinates ;  Surgeon: Mellody Sprout, MD;  Location: [facility];  Service: ENT;  Laterality: Bilateral;   SHOULDER SURGERY Right    ARTHROSCOPIC   WISDOM  TOOTH EXTRACTION      Medical History: Past Medical History:  Diagnosis Date   Anemia    Arthritis    Complication of anesthesia    PT WAS SHEDULED TO HAVE IGSS AT [facility] ON 10-06-15 AND AFTER INDUCTION PT ASPIRATED SCANT AMOUNT OF GASTRIC CONTENTS AND CASE WAS CANCELLED   Deviated septum    Diabetes mellitus (HCC)    on metformin    GERD (gastroesophageal reflux disease)    Hoarseness of voice    SINCE BEING INTUBATED ON 10-06-15   Hypertension    PT DENIES DURING 10-14-15 PHONE INTERVIEW    Nasal congestion    LONG TERM OBSTRUCTION/ENLARGED TURBINATES   Pancreatitis 05/2014   Pancreatitis    PONV (postoperative nausea and vomiting)    Sleep apnea     had surgery no longer uses cpap    Family History: Family History  Problem Relation Age of Onset   Diabetes Mother    Hypertension Mother    Diabetes Father    Cancer Other        paternal aunt    Social History   Socioeconomic History   Marital status: Married    Spouse name: Not on file   Number of children: Not on file   Years of education: Not on file   Highest education level: Not on file  Occupational History   Occupation: Sports administrator: Sports coach  Tobacco Use   Smoking status: Never   Smokeless tobacco: Never  Vaping Use   Vaping status: Never Used  Substance and Sexual Activity   Alcohol use: Yes    Comment: occasional   Drug use: No   Sexual activity: Yes  Other Topics Concern   Not on file  Social History Narrative   Not on file   Social Drivers of Health   Financial Resource Strain: Not on file  Food Insecurity: Not on file  Transportation Needs: Not on file  Physical Activity: Not on file  Stress: Not on file  Social Connections: Not on file  Intimate Partner Violence: Not on file      Review of Systems  Constitutional:  Negative for chills, fatigue and unexpected weight change.  HENT:  Negative for congestion, postnasal drip, rhinorrhea, sneezing and sore  throat.   Eyes:  Negative for redness.  Respiratory:  Negative for cough, chest tightness and shortness of breath.   Cardiovascular:  Negative for chest pain and palpitations.  Gastrointestinal:  Negative for abdominal pain, constipation, diarrhea, nausea and vomiting.  Genitourinary:  Negative for dysuria and frequency.  Musculoskeletal:  Negative for arthralgias, back pain, joint swelling and neck pain.  Skin:  Negative for rash.  Neurological:  Negative for tremors and numbness.  Hematological:  Negative for adenopathy. Does not bruise/bleed easily.  Psychiatric/Behavioral:  Negative for behavioral problems (Depression), sleep disturbance and suicidal ideas. The patient is not nervous/anxious.     Vital Signs: BP 122/88   Pulse 78   Temp 97.8 F (36.6 C)   Resp 16   Ht 5\' 7"  (1.702 m)   Wt 196 lb (88.9  kg)   SpO2 96%   BMI 30.70 kg/m    Physical Exam Vitals and nursing note reviewed.  Constitutional:      Appearance: Normal appearance. He is obese.  HENT:     Head: Normocephalic and atraumatic.  Eyes:     Extraocular Movements: Extraocular movements intact.  Cardiovascular:     Rate and Rhythm: Normal rate and regular rhythm.     Pulses: Normal pulses.     Heart sounds: Normal heart sounds.  Pulmonary:     Effort: Pulmonary effort is normal.     Breath sounds: Normal breath sounds.  Musculoskeletal:        General: Normal range of motion.  Skin:    General: Skin is warm.  Neurological:     General: No focal deficit present.     Mental Status: He is alert.  Psychiatric:        Mood and Affect: Mood normal.        Behavior: Behavior normal.        Thought Content: Thought content normal.        Judgment: Judgment normal.        Assessment/Plan: 1. Type 2 diabetes mellitus with hyperglycemia, unspecified whether long term insulin  use (HCC) (Primary) - POCT HgB A1C is 11.2 which is very elevated from 8.4 last visit. Will restart metformin  and continue  rybelsus  and glimepiride . Will also start tresiba at supper to help get better control. Pt advised to monitor BG very closely and call if any lows as may need to hold glimepiride  or adjust doses. May stagger starting metformin  and insulin  to see how he responds. - metFORMIN  (GLUCOPHAGE -XR) 500 MG 24 hr tablet; Take 1 tablet (500 mg total) by mouth daily with breakfast.  Dispense: 30 tablet; Refill: 2 - insulin  degludec (TRESIBA FLEXTOUCH) 100 UNIT/ML FlexTouch Pen; Inject 10 Units into the skin daily.  Dispense: 3 mL; Refill: 2  2. Essential hypertension Well controlled, advised to monitor to ensure not dropping too low   General Counseling: Ronnie Mejia verbalizes understanding of the findings of todays visit and agrees with plan of treatment. I have discussed any further diagnostic evaluation that may be needed or ordered today. We also reviewed his medications today. he has been encouraged to call the office with any questions or concerns that should arise related to todays visit.    Orders Placed This Encounter  Procedures   POCT HgB A1C    Meds ordered this encounter  Medications   metFORMIN  (GLUCOPHAGE -XR) 500 MG 24 hr tablet    Sig: Take 1 tablet (500 mg total) by mouth daily with breakfast.    Dispense:  30 tablet    Refill:  2   insulin  degludec (TRESIBA FLEXTOUCH) 100 UNIT/ML FlexTouch Pen    Sig: Inject 10 Units into the skin daily.    Dispense:  3 mL    Refill:  2    This patient was seen by Taylor Favia, PA-C in collaboration with Dr. Verneta Gone as a part of collaborative care agreement.   Total time spent:30 Minutes Time spent includes review of chart, medications, test results, and follow up plan with the patient.      Dr Fozia M Khan Internal medicine

## 2024-03-13 ENCOUNTER — Other Ambulatory Visit: Payer: Self-pay | Admitting: Physician Assistant

## 2024-03-13 DIAGNOSIS — E1165 Type 2 diabetes mellitus with hyperglycemia: Secondary | ICD-10-CM

## 2024-04-24 ENCOUNTER — Ambulatory Visit (INDEPENDENT_AMBULATORY_CARE_PROVIDER_SITE_OTHER): Admitting: Physician Assistant

## 2024-04-24 ENCOUNTER — Encounter: Payer: Self-pay | Admitting: Physician Assistant

## 2024-04-24 ENCOUNTER — Telehealth: Payer: Self-pay

## 2024-04-24 ENCOUNTER — Other Ambulatory Visit: Payer: Self-pay | Admitting: Physician Assistant

## 2024-04-24 VITALS — BP 130/82 | HR 69 | Temp 97.8°F | Resp 16 | Ht 67.0 in | Wt 202.0 lb

## 2024-04-24 DIAGNOSIS — E1165 Type 2 diabetes mellitus with hyperglycemia: Secondary | ICD-10-CM | POA: Diagnosis not present

## 2024-04-24 DIAGNOSIS — I10 Essential (primary) hypertension: Secondary | ICD-10-CM

## 2024-04-24 MED ORDER — METFORMIN HCL ER (MOD) 1000 MG PO TB24: 1000.0000 mg | ORAL_TABLET | Freq: Every day | ORAL | 1 refills | Status: DC

## 2024-04-24 NOTE — Telephone Encounter (Signed)
 Completed P.A. for patient's Metformin .

## 2024-04-24 NOTE — Progress Notes (Signed)
 [facility] [address] [place], [place] [address]  Internal MEDICINE  Office Visit Note  Patient Name: Ronnie Mejia  [id number]  [id number]  Date of Service: 04/24/2024  Chief Complaint  Patient presents with   Follow-up   Diabetes    HPI Pt is here for routine follow up -BG improving, this morning was 170, previously had been upper 200s -Tolerating metformin  and insulin  10units -avg weekly BG in AM is 179, has had some readings 140s, but an occasional 200 as well -trying to work on diet as well and make better choices -BP stable  Current Medication: Outpatient Encounter Medications as of 04/24/2024  Medication Sig   Accu-Chek FastClix Lancets MISC Use as directed twice daily diag E11.65   Blood Glucose Monitoring Suppl (ACCU-CHEK GUIDE ME) w/Device KIT Use as directed. E11.65   EPINEPHrine  0.3 mg/0.3 mL IJ SOAJ injection Inject 0.3 mg into the muscle as needed for anaphylaxis.   glimepiride  (AMARYL ) 2 MG tablet Take 1 tablet (2 mg total) by mouth 2 (two) times daily.   glucose blood (ACCU-CHEK GUIDE) test strip 1 each by Other route 2 (two) times daily. Use as instructed twice a daily e11.65   insulin  degludec (TRESIBA  FLEXTOUCH) 100 UNIT/ML FlexTouch Pen Inject 10 Units into the skin daily.   lisinopril  (ZESTRIL ) 2.5 MG tablet Take 1 tablet (2.5 mg total) by mouth daily.   metFORMIN  (GLUMETZA ) 1000 MG (MOD) 24 hr tablet Take 1 tablet (1,000 mg total) by mouth daily with breakfast.   pantoprazole  (PROTONIX ) 40 MG tablet Take 1 tablet (40 mg total) by mouth daily.   rosuvastatin  (CRESTOR ) 5 MG tablet TAKE 1 TABLET (5 MG TOTAL) BY MOUTH DAILY. TAKE 1 TABLET BY MOUTH DAILY FOR CHOLESTEROL   Semaglutide  (RYBELSUS ) 7 MG TABS Take 1 tablet (7 mg total) by mouth daily.   tadalafil  (CIALIS ) 20 MG tablet Take 1 tablet (20 mg total) by mouth daily.   [DISCONTINUED] dapagliflozin  propanediol (FARXIGA ) 10 MG TABS tablet Take 1 tablet (10 mg total) by mouth daily.    [DISCONTINUED] metFORMIN  (GLUCOPHAGE -XR) 500 MG 24 hr tablet Take 1 tablet (500 mg total) by mouth daily with breakfast.   No facility-administered encounter medications on file as of 04/24/2024.    Surgical History: Past Surgical History:  Procedure Laterality Date   ESOPHAGOGASTRODUODENOSCOPY (EGD) WITH PROPOFOL  N/A 02/28/2018   Procedure: ESOPHAGOGASTRODUODENOSCOPY (EGD) WITH PROPOFOL ;  Surgeon: Rollin Dover, MD;  Location: [facility];  Service: Endoscopy;  Laterality: N/A;   ESOPHAGOGASTRODUODENOSCOPY (EGD) WITH PROPOFOL  N/A 04/18/2018   Procedure: ESOPHAGOGASTRODUODENOSCOPY (EGD) WITH PROPOFOL ;  Surgeon: Rollin Dover, MD;  Location: [facility];  Service: Endoscopy;  Laterality: N/A;   HOT HEMOSTASIS N/A 02/28/2018   Procedure: HOT HEMOSTASIS (ARGON PLASMA COAGULATION/BICAP);  Surgeon: Rollin Dover, MD;  Location: [facility];  Service: Endoscopy;  Laterality: N/A;   HOT HEMOSTASIS N/A 04/18/2018   Procedure: HOT HEMOSTASIS (ARGON PLASMA COAGULATION/BICAP);  Surgeon: Rollin Dover, MD;  Location: [facility];  Service: Endoscopy;  Laterality: N/A;   NASAL SEPTOPLASTY W/ TURBINOPLASTY Bilateral 10/17/2015   Procedure: Nasal Septoplasty, Bilateral Partial Reduction Inferior Turbinates ;  Surgeon: Deward Argue, MD;  Location: [facility];  Service: ENT;  Laterality: Bilateral;   SHOULDER SURGERY Right    ARTHROSCOPIC   WISDOM TOOTH EXTRACTION      Medical History: Past Medical History:  Diagnosis Date   Anemia    Arthritis    Complication of anesthesia    PT WAS SHEDULED TO HAVE IGSS AT [facility]  ON 10-06-15 AND AFTER INDUCTION PT ASPIRATED SCANT AMOUNT OF GASTRIC CONTENTS AND CASE WAS CANCELLED   Deviated septum    Diabetes mellitus (HCC)    on metformin    GERD (gastroesophageal reflux disease)    Hoarseness of voice    SINCE BEING INTUBATED ON 10-06-15   Hypertension    PT DENIES DURING 10-14-15 PHONE INTERVIEW    Nasal congestion    LONG TERM OBSTRUCTION/ENLARGED  TURBINATES   Pancreatitis 05/2014   Pancreatitis    PONV (postoperative nausea and vomiting)    Sleep apnea     had surgery no longer uses cpap    Family History: Family History  Problem Relation Age of Onset   Diabetes Mother    Hypertension Mother    Diabetes Father    Cancer Other        paternal aunt    Social History   Socioeconomic History   Marital status: Married    Spouse name: Not on file   Number of children: Not on file   Years of education: Not on file   Highest education level: Not on file  Occupational History   Occupation: Sports administrator: Sports coach  Tobacco Use   Smoking status: Never   Smokeless tobacco: Never  Vaping Use   Vaping status: Never Used  Substance and Sexual Activity   Alcohol use: Yes    Comment: occasional   Drug use: No   Sexual activity: Yes  Other Topics Concern   Not on file  Social History Narrative   Not on file   Social Drivers of Health   Financial Resource Strain: Not on file  Food Insecurity: Not on file  Transportation Needs: Not on file  Physical Activity: Not on file  Stress: Not on file  Social Connections: Not on file  Intimate Partner Violence: Not on file      Review of Systems  Constitutional:  Negative for chills, fatigue and unexpected weight change.  HENT:  Negative for congestion, postnasal drip, rhinorrhea, sneezing and sore throat.   Eyes:  Negative for redness.  Respiratory:  Negative for cough, chest tightness and shortness of breath.   Cardiovascular:  Negative for chest pain and palpitations.  Gastrointestinal:  Negative for abdominal pain, constipation, diarrhea, nausea and vomiting.  Genitourinary:  Negative for dysuria and frequency.  Musculoskeletal:  Negative for arthralgias, back pain, joint swelling and neck pain.  Skin:  Negative for rash.  Neurological:  Negative for tremors and numbness.  Hematological:  Negative for adenopathy. Does not bruise/bleed easily.   Psychiatric/Behavioral:  Negative for behavioral problems (Depression), sleep disturbance and suicidal ideas. The patient is not nervous/anxious.     Vital Signs: BP 130/82   Pulse 69   Temp 97.8 F (36.6 C)   Resp 16   Ht 5' 7 (1.702 m)   Wt 202 lb (91.6 kg)   SpO2 97%   BMI 31.64 kg/m    Physical Exam Vitals and nursing note reviewed.  Constitutional:      Appearance: Normal appearance. He is obese.  HENT:     Head: Normocephalic and atraumatic.   Eyes:     Extraocular Movements: Extraocular movements intact.    Cardiovascular:     Rate and Rhythm: Normal rate and regular rhythm.     Pulses: Normal pulses.     Heart sounds: Normal heart sounds.  Pulmonary:     Effort: Pulmonary effort is normal.     Breath sounds: Normal breath  sounds.   Musculoskeletal:        General: Normal range of motion.   Skin:    General: Skin is warm.   Neurological:     General: No focal deficit present.     Mental Status: He is alert.   Psychiatric:        Mood and Affect: Mood normal.        Behavior: Behavior normal.        Thought Content: Thought content normal.        Judgment: Judgment normal.        Assessment/Plan: 1. Type 2 diabetes mellitus with hyperglycemia, unspecified whether long term insulin  use (HCC) (Primary) Will increase to 1000mg  metformin  and continue rybelsus , glimepiride  and insulin  10units as before. May consider increasing rybelsus  and/or insulin  in future as well. Continue to monitor - metFORMIN  (GLUMETZA ) 1000 MG (MOD) 24 hr tablet; Take 1 tablet (1,000 mg total) by mouth daily with breakfast.  Dispense: 90 tablet; Refill: 1  2. Essential hypertension Stable, continue to monitor   General Counseling: Demetries verbalizes understanding of the findings of todays visit and agrees with plan of treatment. I have discussed any further diagnostic evaluation that may be needed or ordered today. We also reviewed his medications today. he has been  encouraged to call the office with any questions or concerns that should arise related to todays visit.    No orders of the defined types were placed in this encounter.   Meds ordered this encounter  Medications   metFORMIN  (GLUMETZA ) 1000 MG (MOD) 24 hr tablet    Sig: Take 1 tablet (1,000 mg total) by mouth daily with breakfast.    Dispense:  90 tablet    Refill:  1    This patient was seen by Tinnie Pro, PA-C in collaboration with Dr. Sigrid Bathe as a part of collaborative care agreement.   Total time spent:30 Minutes Time spent includes review of chart, medications, test results, and follow up plan with the patient.      Dr Fozia M Khan Internal medicine

## 2024-04-26 ENCOUNTER — Other Ambulatory Visit: Payer: Self-pay | Admitting: Physician Assistant

## 2024-04-26 DIAGNOSIS — E1165 Type 2 diabetes mellitus with hyperglycemia: Secondary | ICD-10-CM

## 2024-04-27 ENCOUNTER — Other Ambulatory Visit: Payer: Self-pay | Admitting: Physician Assistant

## 2024-04-27 DIAGNOSIS — K219 Gastro-esophageal reflux disease without esophagitis: Secondary | ICD-10-CM

## 2024-04-27 DIAGNOSIS — E1165 Type 2 diabetes mellitus with hyperglycemia: Secondary | ICD-10-CM

## 2024-06-25 ENCOUNTER — Ambulatory Visit (INDEPENDENT_AMBULATORY_CARE_PROVIDER_SITE_OTHER): Admitting: Physician Assistant

## 2024-06-25 ENCOUNTER — Encounter: Payer: Self-pay | Admitting: Physician Assistant

## 2024-06-25 VITALS — BP 120/87 | HR 84 | Temp 98.1°F | Resp 16 | Ht 67.0 in | Wt 197.0 lb

## 2024-06-25 DIAGNOSIS — E1165 Type 2 diabetes mellitus with hyperglycemia: Secondary | ICD-10-CM

## 2024-06-25 DIAGNOSIS — I10 Essential (primary) hypertension: Secondary | ICD-10-CM

## 2024-06-25 DIAGNOSIS — Z794 Long term (current) use of insulin: Secondary | ICD-10-CM | POA: Diagnosis not present

## 2024-06-25 LAB — POCT GLYCOSYLATED HEMOGLOBIN (HGB A1C): Hemoglobin A1C: 9.1 % — AB (ref 4.0–5.6)

## 2024-06-25 MED ORDER — RYBELSUS 14 MG PO TABS: 14.0000 mg | ORAL_TABLET | Freq: Every day | ORAL | 2 refills | Status: DC

## 2024-06-25 MED ORDER — FREESTYLE LIBRE 3 PLUS SENSOR MISC: 3 refills | Status: DC

## 2024-06-25 NOTE — Progress Notes (Signed)
 [facility] [address] [place], [place] [address]  Internal MEDICINE  Office Visit Note  Patient Name: Ronnie Mejia  [id number]  [id number]  Date of Service: 06/25/2024  Chief Complaint  Patient presents with   Follow-up   Diabetes   Gastroesophageal Reflux   Hypertension    HPI Pt is here for routine follow up -tolerating medications well with addition of metformin  and insulin  -checks sugars occasionally and has found they are bad in AM, but good the rest of the day. He does take all meds in AM except the insulin . Discussed spacing meds out and splitting up glimepiride  to BID if not improving -Will increase rybelsus  to 14mg  and may increase insulin  to 12units and monitor -Will try to send CGM for better monitoring and guidance to help patient track and avoid lows -pt did expect A1c to come down further with his meds, but provided reassurance that it is making progress again and keep working toward this. -BP stable  Current Medication: Outpatient Encounter Medications as of 06/25/2024  Medication Sig   Accu-Chek FastClix Lancets MISC Use as directed twice daily diag E11.65   Blood Glucose Monitoring Suppl (ACCU-CHEK GUIDE ME) w/Device KIT Use as directed. E11.65   Continuous Glucose Sensor (FREESTYLE LIBRE 3 PLUS SENSOR) MISC Change sensor every 15 days.   EPINEPHrine  0.3 mg/0.3 mL IJ SOAJ injection Inject 0.3 mg into the muscle as needed for anaphylaxis.   glimepiride  (AMARYL ) 2 MG tablet Take 1 tablet (2 mg total) by mouth 2 (two) times daily.   glucose blood (ACCU-CHEK GUIDE) test strip 1 each by Other route 2 (two) times daily. Use as instructed twice a daily e11.65   insulin  degludec (TRESIBA  FLEXTOUCH) 100 UNIT/ML FlexTouch Pen INJECT 10 UNITS INTO THE SKIN DAILY   lisinopril  (ZESTRIL ) 2.5 MG tablet Take 1 tablet (2.5 mg total) by mouth daily.   metFORMIN  (GLUMETZA ) 1000 MG (MOD) 24 hr tablet Take 1 tablet (1,000 mg total) by mouth daily with breakfast.    pantoprazole  (PROTONIX ) 40 MG tablet TAKE 1 TABLET BY MOUTH EVERY DAY   rosuvastatin  (CRESTOR ) 5 MG tablet TAKE 1 TABLET (5 MG TOTAL) BY MOUTH DAILY. TAKE 1 TABLET BY MOUTH DAILY FOR CHOLESTEROL   Semaglutide  (RYBELSUS ) 14 MG TABS Take 1 tablet (14 mg total) by mouth daily.   tadalafil  (CIALIS ) 20 MG tablet Take 1 tablet (20 mg total) by mouth daily.   [DISCONTINUED] RYBELSUS  7 MG TABS TAKE 1 TABLET (7 MG TOTAL) BY MOUTH DAILY   No facility-administered encounter medications on file as of 06/25/2024.    Surgical History: Past Surgical History:  Procedure Laterality Date   ESOPHAGOGASTRODUODENOSCOPY (EGD) WITH PROPOFOL  N/A 02/28/2018   Procedure: ESOPHAGOGASTRODUODENOSCOPY (EGD) WITH PROPOFOL ;  Surgeon: Rollin Dover, MD;  Location: [facility];  Service: Endoscopy;  Laterality: N/A;   ESOPHAGOGASTRODUODENOSCOPY (EGD) WITH PROPOFOL  N/A 04/18/2018   Procedure: ESOPHAGOGASTRODUODENOSCOPY (EGD) WITH PROPOFOL ;  Surgeon: Rollin Dover, MD;  Location: [facility];  Service: Endoscopy;  Laterality: N/A;   HOT HEMOSTASIS N/A 02/28/2018   Procedure: HOT HEMOSTASIS (ARGON PLASMA COAGULATION/BICAP);  Surgeon: Rollin Dover, MD;  Location: [facility];  Service: Endoscopy;  Laterality: N/A;   HOT HEMOSTASIS N/A 04/18/2018   Procedure: HOT HEMOSTASIS (ARGON PLASMA COAGULATION/BICAP);  Surgeon: Rollin Dover, MD;  Location: [facility];  Service: Endoscopy;  Laterality: N/A;   NASAL SEPTOPLASTY W/ TURBINOPLASTY Bilateral 10/17/2015   Procedure: Nasal Septoplasty, Bilateral Partial Reduction Inferior Turbinates ;  Surgeon: Deward Argue, MD;  Location: [facility];  Service:  ENT;  Laterality: Bilateral;   SHOULDER SURGERY Right    ARTHROSCOPIC   WISDOM TOOTH EXTRACTION      Medical History: Past Medical History:  Diagnosis Date   Anemia    Arthritis    Complication of anesthesia    PT WAS SHEDULED TO HAVE IGSS AT [facility] ON 10-06-15 AND AFTER INDUCTION PT ASPIRATED SCANT AMOUNT OF GASTRIC  CONTENTS AND CASE WAS CANCELLED   Deviated septum    Diabetes mellitus (HCC)    on metformin    GERD (gastroesophageal reflux disease)    Hoarseness of voice    SINCE BEING INTUBATED ON 10-06-15   Hypertension    PT DENIES DURING 10-14-15 PHONE INTERVIEW    Nasal congestion    LONG TERM OBSTRUCTION/ENLARGED TURBINATES   Pancreatitis 05/2014   Pancreatitis    PONV (postoperative nausea and vomiting)    Sleep apnea     had surgery no longer uses cpap    Family History: Family History  Problem Relation Age of Onset   Diabetes Mother    Hypertension Mother    Diabetes Father    Cancer Other        paternal aunt    Social History   Socioeconomic History   Marital status: Married    Spouse name: Not on file   Number of children: Not on file   Years of education: Not on file   Highest education level: Not on file  Occupational History   Occupation: Sports administrator: Sports coach  Tobacco Use   Smoking status: Never   Smokeless tobacco: Never  Vaping Use   Vaping status: Never Used  Substance and Sexual Activity   Alcohol use: Yes    Comment: occasional   Drug use: No   Sexual activity: Yes  Other Topics Concern   Not on file  Social History Narrative   Not on file   Social Drivers of Health   Financial Resource Strain: Not on file  Food Insecurity: Not on file  Transportation Needs: Not on file  Physical Activity: Not on file  Stress: Not on file  Social Connections: Not on file  Intimate Partner Violence: Not on file      Review of Systems  Constitutional:  Negative for chills, fatigue and unexpected weight change.  HENT:  Negative for congestion, postnasal drip, rhinorrhea, sneezing and sore throat.   Eyes:  Negative for redness.  Respiratory:  Negative for cough, chest tightness and shortness of breath.   Cardiovascular:  Negative for chest pain and palpitations.  Gastrointestinal:  Negative for abdominal pain, constipation, diarrhea, nausea and  vomiting.  Genitourinary:  Negative for dysuria and frequency.  Musculoskeletal:  Negative for arthralgias, back pain, joint swelling and neck pain.  Skin:  Negative for rash.  Neurological:  Negative for tremors and numbness.  Hematological:  Negative for adenopathy. Does not bruise/bleed easily.  Psychiatric/Behavioral:  Negative for behavioral problems (Depression), sleep disturbance and suicidal ideas. The patient is not nervous/anxious.     Vital Signs: BP 120/87   Pulse 84   Temp 98.1 F (36.7 C)   Resp 16   Ht 5' 7 (1.702 m)   Wt 197 lb (89.4 kg)   SpO2 99%   BMI 30.85 kg/m    Physical Exam Vitals and nursing note reviewed.  Constitutional:      Appearance: Normal appearance. He is obese.  HENT:     Head: Normocephalic and atraumatic.  Eyes:  Extraocular Movements: Extraocular movements intact.  Cardiovascular:     Rate and Rhythm: Normal rate and regular rhythm.     Pulses: Normal pulses.     Heart sounds: Normal heart sounds.  Pulmonary:     Effort: Pulmonary effort is normal.     Breath sounds: Normal breath sounds.  Musculoskeletal:        General: Normal range of motion.  Skin:    General: Skin is warm.  Neurological:     General: No focal deficit present.     Mental Status: He is alert.  Psychiatric:        Mood and Affect: Mood normal.        Behavior: Behavior normal.        Thought Content: Thought content normal.        Judgment: Judgment normal.        Assessment/Plan: 1. Type 2 diabetes mellitus with hyperglycemia, with long-term current use of insulin  (HCC) (Primary) - POCT HgB A1C is 9.1 which is improved from 11.2 last visit. Will increase rybelsus  to 14mg  and may increase insulin  to 12units daily. Will also send CGM to help with monitoring - Semaglutide  (RYBELSUS ) 14 MG TABS; Take 1 tablet (14 mg total) by mouth daily.  Dispense: 30 tablet; Refill: 2 - Continuous Glucose Sensor (FREESTYLE LIBRE 3 PLUS SENSOR) MISC; Change sensor  every 15 days.  Dispense: 1 each; Refill: 3  2. Essential hypertension Stable, continue lisinopril  as before   General Counseling: Delance verbalizes understanding of the findings of todays visit and agrees with plan of treatment. I have discussed any further diagnostic evaluation that may be needed or ordered today. We also reviewed his medications today. he has been encouraged to call the office with any questions or concerns that should arise related to todays visit.    Orders Placed This Encounter  Procedures   POCT HgB A1C    Meds ordered this encounter  Medications   Semaglutide  (RYBELSUS ) 14 MG TABS    Sig: Take 1 tablet (14 mg total) by mouth daily.    Dispense:  30 tablet    Refill:  2   Continuous Glucose Sensor (FREESTYLE LIBRE 3 PLUS SENSOR) MISC    Sig: Change sensor every 15 days.    Dispense:  1 each    Refill:  3    This patient was seen by Tinnie Pro, PA-C in collaboration with Dr. Sigrid Bathe as a part of collaborative care agreement.   Total time spent:30 Minutes Time spent includes review of chart, medications, test results, and follow up plan with the patient.      Dr Fozia M Khan Internal medicine

## 2024-07-21 ENCOUNTER — Other Ambulatory Visit: Payer: Self-pay | Admitting: Physician Assistant

## 2024-07-21 DIAGNOSIS — E1165 Type 2 diabetes mellitus with hyperglycemia: Secondary | ICD-10-CM

## 2024-08-13 ENCOUNTER — Other Ambulatory Visit: Payer: Self-pay

## 2024-08-13 ENCOUNTER — Ambulatory Visit (INDEPENDENT_AMBULATORY_CARE_PROVIDER_SITE_OTHER): Admitting: Physician Assistant

## 2024-08-13 ENCOUNTER — Encounter: Payer: Self-pay | Admitting: Physician Assistant

## 2024-08-13 VITALS — BP 124/78 | HR 77 | Temp 97.8°F | Resp 16 | Ht 67.0 in | Wt 201.0 lb

## 2024-08-13 DIAGNOSIS — I10 Essential (primary) hypertension: Secondary | ICD-10-CM | POA: Diagnosis not present

## 2024-08-13 DIAGNOSIS — Z125 Encounter for screening for malignant neoplasm of prostate: Secondary | ICD-10-CM

## 2024-08-13 DIAGNOSIS — E1165 Type 2 diabetes mellitus with hyperglycemia: Secondary | ICD-10-CM | POA: Diagnosis not present

## 2024-08-13 DIAGNOSIS — R5383 Other fatigue: Secondary | ICD-10-CM

## 2024-08-13 DIAGNOSIS — E782 Mixed hyperlipidemia: Secondary | ICD-10-CM | POA: Diagnosis not present

## 2024-08-13 DIAGNOSIS — Z794 Long term (current) use of insulin: Secondary | ICD-10-CM

## 2024-08-13 DIAGNOSIS — Z1329 Encounter for screening for other suspected endocrine disorder: Secondary | ICD-10-CM

## 2024-08-13 LAB — POCT CBG (FASTING - GLUCOSE)-MANUAL ENTRY: Glucose Fasting, POC: 216 mg/dL — AB (ref 70–99)

## 2024-08-13 MED ORDER — TRESIBA FLEXTOUCH 100 UNIT/ML ~~LOC~~ SOPN: 20.0000 [IU] | PEN_INJECTOR | Freq: Every day | SUBCUTANEOUS | Status: DC

## 2024-08-13 MED ORDER — RYBELSUS 14 MG PO TABS: 14.0000 mg | ORAL_TABLET | Freq: Every day | ORAL | 3 refills | Status: DC

## 2024-08-13 NOTE — Progress Notes (Signed)
 [facility] [address] [place], [place] [address]  Internal MEDICINE  Office Visit Note  Patient Name: Ronnie Mejia  [id number]  [id number]  Date of Service: 08/13/2024  Chief Complaint  Patient presents with   Follow-up    Diabetes     HPI Pt is here for routine follow up -Now  taking 500mg  ER metformin  and 2mg  glimepiride  twice daily, he broke it up rather than taking all his meds at once in AM -rybelsus  daily -Thinks he is up to at least 15units Tresiba  currently, but did not have it last week due to traveling on cruise -216 this morning -will go up to 20units tresiba  -discussed possible sliding scale but he wants to wait until he gets his freestyle libre monitor set up and can monitor better. -Continues to take lisinopril  and BP controlled  Current Medication: Outpatient Encounter Medications as of 08/13/2024  Medication Sig   Accu-Chek FastClix Lancets MISC Use as directed twice daily diag E11.65   Blood Glucose Monitoring Suppl (ACCU-CHEK GUIDE ME) w/Device KIT Use as directed. E11.65   Continuous Glucose Sensor (FREESTYLE LIBRE 3 PLUS SENSOR) MISC Change sensor every 15 days.   EPINEPHrine  0.3 mg/0.3 mL IJ SOAJ injection Inject 0.3 mg into the muscle as needed for anaphylaxis.   glimepiride  (AMARYL ) 2 MG tablet Take 1 tablet (2 mg total) by mouth 2 (two) times daily.   glucose blood (ACCU-CHEK GUIDE) test strip 1 each by Other route 2 (two) times daily. Use as instructed twice a daily e11.65   lisinopril  (ZESTRIL ) 2.5 MG tablet Take 1 tablet (2.5 mg total) by mouth daily.   metFORMIN  (GLUMETZA ) 1000 MG (MOD) 24 hr tablet Take 1 tablet (1,000 mg total) by mouth daily with breakfast.   pantoprazole  (PROTONIX ) 40 MG tablet TAKE 1 TABLET BY MOUTH EVERY DAY   rosuvastatin  (CRESTOR ) 5 MG tablet TAKE 1 TABLET (5 MG TOTAL) BY MOUTH DAILY. TAKE 1 TABLET BY MOUTH DAILY FOR CHOLESTEROL   tadalafil  (CIALIS ) 20 MG tablet Take 1 tablet (20 mg total) by mouth daily.    [DISCONTINUED] insulin  degludec (TRESIBA  FLEXTOUCH) 100 UNIT/ML FlexTouch Pen INJECT 10 UNITS INTO THE SKIN DAILY   [DISCONTINUED] Semaglutide  (RYBELSUS ) 14 MG TABS Take 1 tablet (14 mg total) by mouth daily.   insulin  degludec (TRESIBA  FLEXTOUCH) 100 UNIT/ML FlexTouch Pen Inject 20 Units into the skin daily.   Semaglutide  (RYBELSUS ) 14 MG TABS Take 1 tablet (14 mg total) by mouth daily.   No facility-administered encounter medications on file as of 08/13/2024.    Surgical History: Past Surgical History:  Procedure Laterality Date   ESOPHAGOGASTRODUODENOSCOPY (EGD) WITH PROPOFOL  N/A 02/28/2018   Procedure: ESOPHAGOGASTRODUODENOSCOPY (EGD) WITH PROPOFOL ;  Surgeon: Rollin Dover, MD;  Location: [facility];  Service: Endoscopy;  Laterality: N/A;   ESOPHAGOGASTRODUODENOSCOPY (EGD) WITH PROPOFOL  N/A 04/18/2018   Procedure: ESOPHAGOGASTRODUODENOSCOPY (EGD) WITH PROPOFOL ;  Surgeon: Rollin Dover, MD;  Location: [facility];  Service: Endoscopy;  Laterality: N/A;   HOT HEMOSTASIS N/A 02/28/2018   Procedure: HOT HEMOSTASIS (ARGON PLASMA COAGULATION/BICAP);  Surgeon: Rollin Dover, MD;  Location: [facility];  Service: Endoscopy;  Laterality: N/A;   HOT HEMOSTASIS N/A 04/18/2018   Procedure: HOT HEMOSTASIS (ARGON PLASMA COAGULATION/BICAP);  Surgeon: Rollin Dover, MD;  Location: [facility];  Service: Endoscopy;  Laterality: N/A;   NASAL SEPTOPLASTY W/ TURBINOPLASTY Bilateral 10/17/2015   Procedure: Nasal Septoplasty, Bilateral Partial Reduction Inferior Turbinates ;  Surgeon: Deward Argue, MD;  Location: [facility];  Service: ENT;  Laterality: Bilateral;  SHOULDER SURGERY Right    ARTHROSCOPIC   WISDOM TOOTH EXTRACTION      Medical History: Past Medical History:  Diagnosis Date   Anemia    Arthritis    Complication of anesthesia    PT WAS SHEDULED TO HAVE IGSS AT [facility] ON 10-06-15 AND AFTER INDUCTION PT ASPIRATED SCANT AMOUNT OF GASTRIC CONTENTS AND CASE WAS CANCELLED   Deviated  septum    Diabetes mellitus (HCC)    on metformin    GERD (gastroesophageal reflux disease)    Hoarseness of voice    SINCE BEING INTUBATED ON 10-06-15   Hypertension    PT DENIES DURING 10-14-15 PHONE INTERVIEW    Nasal congestion    LONG TERM OBSTRUCTION/ENLARGED TURBINATES   Pancreatitis 05/2014   Pancreatitis    PONV (postoperative nausea and vomiting)    Sleep apnea     had surgery no longer uses cpap    Family History: Family History  Problem Relation Age of Onset   Diabetes Mother    Hypertension Mother    Diabetes Father    Cancer Other        paternal aunt    Social History   Socioeconomic History   Marital status: Married    Spouse name: Not on file   Number of children: Not on file   Years of education: Not on file   Highest education level: Not on file  Occupational History   Occupation: Sports administrator: Sports coach  Tobacco Use   Smoking status: Never   Smokeless tobacco: Never  Vaping Use   Vaping status: Never Used  Substance and Sexual Activity   Alcohol use: Yes    Comment: occasional   Drug use: No   Sexual activity: Yes  Other Topics Concern   Not on file  Social History Narrative   Not on file   Social Drivers of Health   Financial Resource Strain: Not on file  Food Insecurity: Not on file  Transportation Needs: Not on file  Physical Activity: Not on file  Stress: Not on file  Social Connections: Not on file  Intimate Partner Violence: Not on file      Review of Systems  Constitutional:  Negative for chills, fatigue and unexpected weight change.  HENT:  Negative for congestion, postnasal drip, rhinorrhea, sneezing and sore throat.   Eyes:  Negative for redness.  Respiratory:  Negative for cough, chest tightness and shortness of breath.   Cardiovascular:  Negative for chest pain and palpitations.  Gastrointestinal:  Negative for abdominal pain, constipation, diarrhea, nausea and vomiting.  Genitourinary:  Negative for dysuria  and frequency.  Musculoskeletal:  Negative for arthralgias, back pain, joint swelling and neck pain.  Skin:  Negative for rash.  Neurological:  Negative for tremors and numbness.  Hematological:  Negative for adenopathy. Does not bruise/bleed easily.  Psychiatric/Behavioral:  Negative for behavioral problems (Depression), sleep disturbance and suicidal ideas. The patient is not nervous/anxious.     Vital Signs: BP 124/78 Comment: 140/82  Pulse 77   Temp 97.8 F (36.6 C)   Resp 16   Ht 5' 7 (1.702 m)   Wt 201 lb (91.2 kg)   SpO2 96%   BMI 31.48 kg/m    Physical Exam Vitals and nursing note reviewed.  Constitutional:      Appearance: Normal appearance. He is obese.  HENT:     Head: Normocephalic and atraumatic.  Eyes:     Extraocular Movements: Extraocular movements intact.  Cardiovascular:     Rate and Rhythm: Normal rate and regular rhythm.     Pulses: Normal pulses.     Heart sounds: Normal heart sounds.  Pulmonary:     Effort: Pulmonary effort is normal.     Breath sounds: Normal breath sounds.  Musculoskeletal:        General: Normal range of motion.  Skin:    General: Skin is warm.  Neurological:     General: No focal deficit present.     Mental Status: He is alert.  Psychiatric:        Mood and Affect: Mood normal.        Behavior: Behavior normal.        Thought Content: Thought content normal.        Judgment: Judgment normal.        Assessment/Plan: 1. Type 2 diabetes mellitus with hyperglycemia, with long-term current use of insulin  (HCC) (Primary) Continue metformin , glimepiride , and rybelsus  as before. Will increase tresiba  up to 20units daily. He will start using CGM, may add sliding scale in future - Semaglutide  (RYBELSUS ) 14 MG TABS; Take 1 tablet (14 mg total) by mouth daily.  Dispense: 30 tablet; Refill: 3 - POCT CBG (Fasting - Glucose)  2. Essential hypertension Well controlled on Lisinopril   3. Mixed hyperlipidemia Continue crestor   and will update labs - Lipid Panel With LDL/HDL Ratio  4. Special screening for malignant neoplasm of prostate - PSA Total (Reflex To Free)  5. Thyroid  disorder screening - TSH + free T4  6. Other fatigue - CBC w/Diff/Platelet - Comprehensive metabolic panel with GFR - TSH + free T4 - Lipid Panel With LDL/HDL Ratio - PSA Total (Reflex To Free)   General Counseling: Kullen verbalizes understanding of the findings of todays visit and agrees with plan of treatment. I have discussed any further diagnostic evaluation that may be needed or ordered today. We also reviewed his medications today. he has been encouraged to call the office with any questions or concerns that should arise related to todays visit.    Orders Placed This Encounter  Procedures   CBC w/Diff/Platelet   Comprehensive metabolic panel with GFR   TSH + free T4   Lipid Panel With LDL/HDL Ratio   PSA Total (Reflex To Free)   POCT CBG (Fasting - Glucose)    Meds ordered this encounter  Medications   Semaglutide  (RYBELSUS ) 14 MG TABS    Sig: Take 1 tablet (14 mg total) by mouth daily.    Dispense:  30 tablet    Refill:  3   insulin  degludec (TRESIBA  FLEXTOUCH) 100 UNIT/ML FlexTouch Pen    Sig: Inject 20 Units into the skin daily.    This patient was seen by Tinnie Pro, PA-C in collaboration with Dr. Sigrid Bathe as a part of collaborative care agreement.   Total time spent:30 Minutes Time spent includes review of chart, medications, test results, and follow up plan with the patient.      Dr Fozia M Khan Internal medicine

## 2024-08-23 ENCOUNTER — Other Ambulatory Visit: Payer: Self-pay | Admitting: Physician Assistant

## 2024-08-23 DIAGNOSIS — E1165 Type 2 diabetes mellitus with hyperglycemia: Secondary | ICD-10-CM

## 2024-08-23 DIAGNOSIS — I10 Essential (primary) hypertension: Secondary | ICD-10-CM

## 2024-10-01 ENCOUNTER — Encounter: Payer: Self-pay | Admitting: Physician Assistant

## 2024-10-01 ENCOUNTER — Ambulatory Visit (INDEPENDENT_AMBULATORY_CARE_PROVIDER_SITE_OTHER): Admitting: Physician Assistant

## 2024-10-01 VITALS — BP 130/85 | HR 89 | Temp 98.0°F | Resp 16 | Ht 67.0 in | Wt 203.0 lb

## 2024-10-01 DIAGNOSIS — I10 Essential (primary) hypertension: Secondary | ICD-10-CM | POA: Diagnosis not present

## 2024-10-01 DIAGNOSIS — E1165 Type 2 diabetes mellitus with hyperglycemia: Secondary | ICD-10-CM

## 2024-10-01 DIAGNOSIS — Z794 Long term (current) use of insulin: Secondary | ICD-10-CM

## 2024-10-01 LAB — POCT GLYCOSYLATED HEMOGLOBIN (HGB A1C): Hemoglobin A1C: 8 % — AB (ref 4.0–5.6)

## 2024-10-01 MED ORDER — OZEMPIC (0.25 OR 0.5 MG/DOSE) 2 MG/3ML ~~LOC~~ SOPN: 0.2500 mg | PEN_INJECTOR | SUBCUTANEOUS | 2 refills | Status: DC

## 2024-10-01 MED ORDER — FREESTYLE LIBRE 3 PLUS SENSOR MISC: 11 refills | Status: AC

## 2024-10-01 NOTE — Progress Notes (Signed)
 [facility] [address] [place], [place] [address]  Internal MEDICINE  Office Visit Note  Patient Name: Ronnie Mejia  [id number]  [id number]  Date of Service: 10/01/2024  Chief Complaint  Patient presents with   Follow-up   Diabetes   Hypertension   Gastroesophageal Reflux   Quality Metric Gaps    eGFR, Eye Exam    HPI Pt is here for routine follow up -Was wearing sensor but ran out and needs refills. When he was wearing his glucose was in range about 51%, otherwise high -only low when he forgot his meds and took them all at once later in afternoon -insulin  10units BID; Metfformin 500 BID, 14mg  rybelsus  daily, glimepiride  2mg  BID -really wants to retry for ozempic  as this worked better for him than rybelsus . He will take coupon card with his commercial insurance -still needs to have labs and will see about eye exam  Current Medication: Outpatient Encounter Medications as of 10/01/2024  Medication Sig   Accu-Chek FastClix Lancets MISC Use as directed twice daily diag E11.65   Blood Glucose Monitoring Suppl (ACCU-CHEK GUIDE ME) w/Device KIT Use as directed. E11.65   EPINEPHrine  0.3 mg/0.3 mL IJ SOAJ injection Inject 0.3 mg into the muscle as needed for anaphylaxis.   glimepiride  (AMARYL ) 2 MG tablet Take 1 tablet (2 mg total) by mouth 2 (two) times daily.   glucose blood (ACCU-CHEK GUIDE) test strip 1 each by Other route 2 (two) times daily. Use as instructed twice a daily e11.65   insulin  degludec (TRESIBA  FLEXTOUCH) 100 UNIT/ML FlexTouch Pen Inject 20 Units into the skin daily.   lisinopril  (ZESTRIL ) 2.5 MG tablet TAKE 1 TABLET BY MOUTH EVERY DAY   metFORMIN  (GLUMETZA ) 1000 MG (MOD) 24 hr tablet Take 1 tablet (1,000 mg total) by mouth daily with breakfast.   pantoprazole  (PROTONIX ) 40 MG tablet TAKE 1 TABLET BY MOUTH EVERY DAY   rosuvastatin  (CRESTOR ) 5 MG tablet TAKE 1 TABLET (5 MG TOTAL) BY MOUTH DAILY. TAKE 1 TABLET BY MOUTH DAILY FOR CHOLESTEROL    Semaglutide ,0.25 or 0.5MG /DOS, (OZEMPIC , 0.25 OR 0.5 MG/DOSE,) 2 MG/3ML SOPN Inject 0.25 mg into the skin once a week.   tadalafil  (CIALIS ) 20 MG tablet Take 1 tablet (20 mg total) by mouth daily.   [DISCONTINUED] Continuous Glucose Sensor (FREESTYLE LIBRE 3 PLUS SENSOR) MISC Change sensor every 15 days.   [DISCONTINUED] Semaglutide  (RYBELSUS ) 14 MG TABS Take 1 tablet (14 mg total) by mouth daily.   Continuous Glucose Sensor (FREESTYLE LIBRE 3 PLUS SENSOR) MISC Change sensor every 15 days.   No facility-administered encounter medications on file as of 10/01/2024.    Surgical History: Past Surgical History:  Procedure Laterality Date   ESOPHAGOGASTRODUODENOSCOPY (EGD) WITH PROPOFOL  N/A 02/28/2018   Procedure: ESOPHAGOGASTRODUODENOSCOPY (EGD) WITH PROPOFOL ;  Surgeon: Rollin Dover, MD;  Location: [facility];  Service: Endoscopy;  Laterality: N/A;   ESOPHAGOGASTRODUODENOSCOPY (EGD) WITH PROPOFOL  N/A 04/18/2018   Procedure: ESOPHAGOGASTRODUODENOSCOPY (EGD) WITH PROPOFOL ;  Surgeon: Rollin Dover, MD;  Location: [facility];  Service: Endoscopy;  Laterality: N/A;   HOT HEMOSTASIS N/A 02/28/2018   Procedure: HOT HEMOSTASIS (ARGON PLASMA COAGULATION/BICAP);  Surgeon: Rollin Dover, MD;  Location: [facility];  Service: Endoscopy;  Laterality: N/A;   HOT HEMOSTASIS N/A 04/18/2018   Procedure: HOT HEMOSTASIS (ARGON PLASMA COAGULATION/BICAP);  Surgeon: Rollin Dover, MD;  Location: [facility];  Service: Endoscopy;  Laterality: N/A;   NASAL SEPTOPLASTY W/ TURBINOPLASTY Bilateral 10/17/2015   Procedure: Nasal Septoplasty, Bilateral Partial Reduction Inferior Turbinates ;  Surgeon: Deward Argue, MD;  Location: [facility];  Service: ENT;  Laterality: Bilateral;   SHOULDER SURGERY Right    ARTHROSCOPIC   WISDOM TOOTH EXTRACTION      Medical History: Past Medical History:  Diagnosis Date   Anemia    Arthritis    Complication of anesthesia    PT WAS SHEDULED TO HAVE IGSS AT [facility] ON  10-06-15 AND AFTER INDUCTION PT ASPIRATED SCANT AMOUNT OF GASTRIC CONTENTS AND CASE WAS CANCELLED   Deviated septum    Diabetes mellitus (HCC)    on metformin    GERD (gastroesophageal reflux disease)    Hoarseness of voice    SINCE BEING INTUBATED ON 10-06-15   Hypertension    PT DENIES DURING 10-14-15 PHONE INTERVIEW    Nasal congestion    LONG TERM OBSTRUCTION/ENLARGED TURBINATES   Pancreatitis 05/2014   Pancreatitis    PONV (postoperative nausea and vomiting)    Sleep apnea     had surgery no longer uses cpap    Family History: Family History  Problem Relation Age of Onset   Diabetes Mother    Hypertension Mother    Diabetes Father    Cancer Other        paternal aunt    Social History   Socioeconomic History   Marital status: Married    Spouse name: Not on file   Number of children: Not on file   Years of education: Not on file   Highest education level: Not on file  Occupational History   Occupation: Sports Administrator: Sports Coach  Tobacco Use   Smoking status: Never   Smokeless tobacco: Never  Vaping Use   Vaping status: Never Used  Substance and Sexual Activity   Alcohol use: Yes    Comment: occasional   Drug use: No   Sexual activity: Yes  Other Topics Concern   Not on file  Social History Narrative   Not on file   Social Drivers of Health   Financial Resource Strain: Not on file  Food Insecurity: Not on file  Transportation Needs: Not on file  Physical Activity: Not on file  Stress: Not on file  Social Connections: Not on file  Intimate Partner Violence: Not on file      Review of Systems  Constitutional:  Negative for chills, fatigue and unexpected weight change.  HENT:  Negative for congestion, postnasal drip, rhinorrhea, sneezing and sore throat.   Eyes:  Negative for redness.  Respiratory:  Negative for cough, chest tightness and shortness of breath.   Cardiovascular:  Negative for chest pain and palpitations.  Gastrointestinal:   Negative for abdominal pain, constipation, diarrhea, nausea and vomiting.  Genitourinary:  Negative for dysuria and frequency.  Musculoskeletal:  Negative for arthralgias, back pain, joint swelling and neck pain.  Skin:  Negative for rash.  Neurological:  Negative for tremors and numbness.  Hematological:  Negative for adenopathy. Does not bruise/bleed easily.  Psychiatric/Behavioral:  Negative for behavioral problems (Depression), sleep disturbance and suicidal ideas. The patient is not nervous/anxious.     Vital Signs: BP 130/85   Pulse 89   Temp 98 F (36.7 C)   Resp 16   Ht 5' 7 (1.702 m)   Wt 203 lb (92.1 kg)   SpO2 97%   BMI 31.79 kg/m    Physical Exam Vitals and nursing note reviewed.  Constitutional:      Appearance: Normal appearance. He is obese.  HENT:  Head: Normocephalic and atraumatic.  Eyes:     Extraocular Movements: Extraocular movements intact.  Cardiovascular:     Rate and Rhythm: Normal rate and regular rhythm.     Pulses: Normal pulses.     Heart sounds: Normal heart sounds.  Pulmonary:     Effort: Pulmonary effort is normal.     Breath sounds: Normal breath sounds.  Musculoskeletal:        General: Normal range of motion.  Skin:    General: Skin is warm.  Neurological:     General: No focal deficit present.     Mental Status: He is alert.  Psychiatric:        Mood and Affect: Mood normal.        Behavior: Behavior normal.        Thought Content: Thought content normal.        Judgment: Judgment normal.        Assessment/Plan: 1. Type 2 diabetes mellitus with hyperglycemia, with long-term current use of insulin  (HCC) (Primary) - POCT HgB A1C is 8.0 which is improved from 9.1 last visit. Has been steadily improving. Will retry ozempic  and pt will use coupon card to see if more affordable as this worked better than rybelsus . If not affordable still then will continue the rybelsus . May increase insulin  to 24units daily - Semaglutide ,0.25  or 0.5MG /DOS, (OZEMPIC , 0.25 OR 0.5 MG/DOSE,) 2 MG/3ML SOPN; Inject 0.25 mg into the skin once a week.  Dispense: 3 mL; Refill: 2 - Continuous Glucose Sensor (FREESTYLE LIBRE 3 PLUS SENSOR) MISC; Change sensor every 15 days.  Dispense: 1 each; Refill: 11  2. Essential hypertension Stable, continue current medication   General Counseling: Mikhai verbalizes understanding of the findings of todays visit and agrees with plan of treatment. I have discussed any further diagnostic evaluation that may be needed or ordered today. We also reviewed his medications today. he has been encouraged to call the office with any questions or concerns that should arise related to todays visit.    Orders Placed This Encounter  Procedures   POCT HgB A1C    Meds ordered this encounter  Medications   Semaglutide ,0.25 or 0.5MG /DOS, (OZEMPIC , 0.25 OR 0.5 MG/DOSE,) 2 MG/3ML SOPN    Sig: Inject 0.25 mg into the skin once a week.    Dispense:  3 mL    Refill:  2   Continuous Glucose Sensor (FREESTYLE LIBRE 3 PLUS SENSOR) MISC    Sig: Change sensor every 15 days.    Dispense:  1 each    Refill:  11    This patient was seen by Tinnie Pro, PA-C in collaboration with Dr. Sigrid Bathe as a part of collaborative care agreement.   Total time spent:30 Minutes Time spent includes review of chart, medications, test results, and follow up plan with the patient.      Dr Fozia M Khan Internal medicine

## 2024-10-28 ENCOUNTER — Other Ambulatory Visit: Payer: Self-pay

## 2024-10-28 ENCOUNTER — Telehealth: Payer: Self-pay

## 2024-10-28 DIAGNOSIS — E1165 Type 2 diabetes mellitus with hyperglycemia: Secondary | ICD-10-CM

## 2024-10-28 MED ORDER — RYBELSUS 14 MG PO TABS: 14.0000 mg | ORAL_TABLET | Freq: Every day | ORAL | 0 refills | Status: AC

## 2024-10-28 MED ORDER — GLIMEPIRIDE 2 MG PO TABS: 2.0000 mg | ORAL_TABLET | Freq: Two times a day (BID) | ORAL | 1 refills | Status: AC

## 2024-10-28 MED ORDER — TRESIBA FLEXTOUCH 100 UNIT/ML ~~LOC~~ SOPN: 12.0000 [IU] | PEN_INJECTOR | Freq: Two times a day (BID) | SUBCUTANEOUS | 0 refills | Status: AC

## 2024-10-28 NOTE — Telephone Encounter (Signed)
 Pt called that Ozempic  is expensive he like to go back on RYBELSUS  as per dr fernand d/c Ozempic  and sent RYBELSUS  14 mg daily and also tresiba  he is 12 units bid sent as per dr fernand and also advised to keep appt with lauren and keep glucose reading

## 2024-11-04 ENCOUNTER — Other Ambulatory Visit: Payer: Self-pay | Admitting: Physician Assistant

## 2024-11-04 DIAGNOSIS — K219 Gastro-esophageal reflux disease without esophagitis: Secondary | ICD-10-CM

## 2024-11-05 ENCOUNTER — Ambulatory Visit (INDEPENDENT_AMBULATORY_CARE_PROVIDER_SITE_OTHER): Admitting: Physician Assistant

## 2024-11-05 ENCOUNTER — Encounter: Payer: Self-pay | Admitting: Physician Assistant

## 2024-11-05 VITALS — BP 124/86 | HR 83 | Temp 98.0°F | Resp 16 | Ht 67.0 in | Wt 202.6 lb

## 2024-11-05 DIAGNOSIS — Z794 Long term (current) use of insulin: Secondary | ICD-10-CM | POA: Diagnosis not present

## 2024-11-05 DIAGNOSIS — E1165 Type 2 diabetes mellitus with hyperglycemia: Secondary | ICD-10-CM | POA: Diagnosis not present

## 2024-11-05 DIAGNOSIS — I10 Essential (primary) hypertension: Secondary | ICD-10-CM

## 2024-11-05 NOTE — Progress Notes (Signed)
 [facility] [address] [place], [place] [address]  Internal MEDICINE  Office Visit Note  Patient Name: Ronnie Mejia  [id number]  [id number]  Date of Service: 11/05/2024  Chief Complaint  Patient presents with   Follow-up   Diabetes    Ozempic  still expensive, even with insurance, getting Rybelsus  for $10. Insulin  is now very costly, may need alternative name.   Gastroesophageal Reflux   Hypertension    HPI Pt is here for routine follow up -left without taking meds this morning due to trying to get shop up and going while he would be at appt -Ozempic  still expensive even with coupon, went back to rybelsus  and was able to use coupon for this one. May call insurance to see if alternative GLP1 is preferred and cheaper -He therefore went a little while without rybelsus  or ozempic  -Now tresiba  more expensive, will try coupon for this as well and will contact insurance for alternative options that may be cheaper -BP initially borderline but improved on recheck, has not taken lisinopril  this morning  Current Medication: Outpatient Encounter Medications as of 11/05/2024  Medication Sig   Accu-Chek FastClix Lancets MISC Use as directed twice daily diag E11.65   Blood Glucose Monitoring Suppl (ACCU-CHEK GUIDE ME) w/Device KIT Use as directed. E11.65   Continuous Glucose Sensor (FREESTYLE LIBRE 3 PLUS SENSOR) MISC Change sensor every 15 days.   EPINEPHrine  0.3 mg/0.3 mL IJ SOAJ injection Inject 0.3 mg into the muscle as needed for anaphylaxis.   glimepiride  (AMARYL ) 2 MG tablet Take 1 tablet (2 mg total) by mouth 2 (two) times daily.   glucose blood (ACCU-CHEK GUIDE) test strip 1 each by Other route 2 (two) times daily. Use as instructed twice a daily e11.65   insulin  degludec (TRESIBA  FLEXTOUCH) 100 UNIT/ML FlexTouch Pen Inject 12 Units into the skin 2 (two) times daily.   lisinopril  (ZESTRIL ) 2.5 MG tablet TAKE 1 TABLET BY MOUTH EVERY DAY   metFORMIN  (GLUMETZA ) 1000 MG (MOD)  24 hr tablet Take 1 tablet (1,000 mg total) by mouth daily with breakfast.   pantoprazole  (PROTONIX ) 40 MG tablet TAKE 1 TABLET BY MOUTH EVERY DAY   rosuvastatin  (CRESTOR ) 5 MG tablet TAKE 1 TABLET (5 MG TOTAL) BY MOUTH DAILY. TAKE 1 TABLET BY MOUTH DAILY FOR CHOLESTEROL   Semaglutide  (RYBELSUS ) 14 MG TABS Take 1 tablet (14 mg total) by mouth daily.   tadalafil  (CIALIS ) 20 MG tablet Take 1 tablet (20 mg total) by mouth daily.   No facility-administered encounter medications on file as of 11/05/2024.    Surgical History: Past Surgical History:  Procedure Laterality Date   ESOPHAGOGASTRODUODENOSCOPY (EGD) WITH PROPOFOL  N/A 02/28/2018   Procedure: ESOPHAGOGASTRODUODENOSCOPY (EGD) WITH PROPOFOL ;  Surgeon: Rollin Dover, MD;  Location: [facility];  Service: Endoscopy;  Laterality: N/A;   ESOPHAGOGASTRODUODENOSCOPY (EGD) WITH PROPOFOL  N/A 04/18/2018   Procedure: ESOPHAGOGASTRODUODENOSCOPY (EGD) WITH PROPOFOL ;  Surgeon: Rollin Dover, MD;  Location: [facility];  Service: Endoscopy;  Laterality: N/A;   HOT HEMOSTASIS N/A 02/28/2018   Procedure: HOT HEMOSTASIS (ARGON PLASMA COAGULATION/BICAP);  Surgeon: Rollin Dover, MD;  Location: [facility];  Service: Endoscopy;  Laterality: N/A;   HOT HEMOSTASIS N/A 04/18/2018   Procedure: HOT HEMOSTASIS (ARGON PLASMA COAGULATION/BICAP);  Surgeon: Rollin Dover, MD;  Location: [facility];  Service: Endoscopy;  Laterality: N/A;   NASAL SEPTOPLASTY W/ TURBINOPLASTY Bilateral 10/17/2015   Procedure: Nasal Septoplasty, Bilateral Partial Reduction Inferior Turbinates ;  Surgeon: Deward Argue, MD;  Location: [facility];  Service: ENT;  Laterality:  Bilateral;   SHOULDER SURGERY Right    ARTHROSCOPIC   WISDOM TOOTH EXTRACTION      Medical History: Past Medical History:  Diagnosis Date   Anemia    Arthritis    Complication of anesthesia    PT WAS SHEDULED TO HAVE IGSS AT [facility] ON 10-06-15 AND AFTER INDUCTION PT ASPIRATED SCANT AMOUNT OF GASTRIC  CONTENTS AND CASE WAS CANCELLED   Deviated septum    Diabetes mellitus (HCC)    on metformin    GERD (gastroesophageal reflux disease)    Hoarseness of voice    SINCE BEING INTUBATED ON 10-06-15   Hypertension    PT DENIES DURING 10-14-15 PHONE INTERVIEW    Nasal congestion    LONG TERM OBSTRUCTION/ENLARGED TURBINATES   Pancreatitis 05/2014   Pancreatitis    PONV (postoperative nausea and vomiting)    Sleep apnea     had surgery no longer uses cpap    Family History: Family History  Problem Relation Age of Onset   Diabetes Mother    Hypertension Mother    Diabetes Father    Cancer Other        paternal aunt    Social History   Socioeconomic History   Marital status: Married    Spouse name: Not on file   Number of children: Not on file   Years of education: Not on file   Highest education level: Not on file  Occupational History   Occupation: Sports Administrator: Sports Coach  Tobacco Use   Smoking status: Never   Smokeless tobacco: Never  Vaping Use   Vaping status: Never Used  Substance and Sexual Activity   Alcohol use: Yes    Comment: occasional   Drug use: No   Sexual activity: Yes  Other Topics Concern   Not on file  Social History Narrative   Not on file   Social Drivers of Health   Tobacco Use: Low Risk (11/05/2024)   Patient History    Smoking Tobacco Use: Never    Smokeless Tobacco Use: Never    Passive Exposure: Not on file  Financial Resource Strain: Not on file  Food Insecurity: Not on file  Transportation Needs: Not on file  Physical Activity: Not on file  Stress: Not on file  Social Connections: Not on file  Intimate Partner Violence: Not on file  Depression (PHQ2-9): Low Risk (10/01/2024)   Depression (PHQ2-9)    PHQ-2 Score: 0  Alcohol Screen: Low Risk (10/01/2024)   Alcohol Screen    Last Alcohol Screening Score (AUDIT): 2  Housing: Not on file  Utilities: Not on file  Health Literacy: Not on file      Review of Systems   Constitutional:  Negative for chills, fatigue and unexpected weight change.  HENT:  Negative for congestion, postnasal drip, rhinorrhea, sneezing and sore throat.   Eyes:  Negative for redness.  Respiratory:  Negative for cough, chest tightness and shortness of breath.   Cardiovascular:  Negative for chest pain and palpitations.  Gastrointestinal:  Negative for abdominal pain, constipation, diarrhea, nausea and vomiting.  Genitourinary:  Negative for dysuria and frequency.  Musculoskeletal:  Negative for arthralgias, back pain, joint swelling and neck pain.  Skin:  Negative for rash.  Neurological:  Negative for tremors and numbness.  Hematological:  Negative for adenopathy. Does not bruise/bleed easily.  Psychiatric/Behavioral:  Negative for behavioral problems (Depression), sleep disturbance and suicidal ideas. The patient is not nervous/anxious.     Vital  Signs: BP 124/86 Comment: 117/90  Pulse 83   Temp 98 F (36.7 C)   Resp 16   Ht 5' 7 (1.702 m)   Wt 202 lb 9.6 oz (91.9 kg)   SpO2 100%   BMI 31.73 kg/m    Physical Exam Vitals and nursing note reviewed.  Constitutional:      Appearance: Normal appearance. He is obese.  HENT:     Head: Normocephalic and atraumatic.  Eyes:     Extraocular Movements: Extraocular movements intact.  Cardiovascular:     Rate and Rhythm: Normal rate and regular rhythm.     Pulses: Normal pulses.     Heart sounds: Normal heart sounds.  Pulmonary:     Effort: Pulmonary effort is normal.     Breath sounds: Normal breath sounds.  Musculoskeletal:        General: Normal range of motion.  Skin:    General: Skin is warm.  Neurological:     General: No focal deficit present.     Mental Status: He is alert.  Psychiatric:        Mood and Affect: Mood normal.        Behavior: Behavior normal.        Thought Content: Thought content normal.        Judgment: Judgment normal.        Assessment/Plan: 1. Type 2 diabetes mellitus with  hyperglycemia, with long-term current use of insulin  (HCC) (Primary) Unfortunately unable to adjust meds due to costs. Back on rybelsus  and tolerating 12units tresiba  BID, but will try coupon to help with costs. Call if still expensive and can try alternative brand. Advised to contact insurance for preferred meds. Continue to monitor BG and call if any concerns - Urine Microalbumin w/creat. ratio  2. Essential hypertension Continue lisinopril  daily   General Counseling: Ronnie Mejia verbalizes understanding of the findings of todays visit and agrees with plan of treatment. I have discussed any further diagnostic evaluation that may be needed or ordered today. We also reviewed his medications today. he has been encouraged to call the office with any questions or concerns that should arise related to todays visit.    Orders Placed This Encounter  Procedures   Urine Microalbumin w/creat. ratio    No orders of the defined types were placed in this encounter.   This patient was seen by Tinnie Pro, PA-C in collaboration with Dr. Sigrid Bathe as a part of collaborative care agreement.   Total time spent:30 Minutes Time spent includes review of chart, medications, test results, and follow up plan with the patient.      Dr Fozia M Khan Internal medicine

## 2024-11-06 LAB — MICROALBUMIN / CREATININE URINE RATIO
Creatinine, Urine: 191.8 mg/dL
Microalb/Creat Ratio: 25 mg/g{creat} (ref 0–29)
Microalbumin, Urine: 47.6 ug/mL

## 2024-11-16 ENCOUNTER — Encounter: Payer: 59 | Admitting: Physician Assistant

## 2024-11-24 ENCOUNTER — Other Ambulatory Visit: Payer: Self-pay | Admitting: Physician Assistant

## 2024-11-24 DIAGNOSIS — E1165 Type 2 diabetes mellitus with hyperglycemia: Secondary | ICD-10-CM

## 2025-01-04 ENCOUNTER — Encounter: Admitting: Physician Assistant
